# Patient Record
Sex: Female | Born: 1985
Health system: Southern US, Community
[De-identification: ages and names within clinical notes are randomized; demographics above are authoritative.]

## PROBLEM LIST (undated history)

## (undated) DIAGNOSIS — E119 Type 2 diabetes mellitus without complications: Secondary | ICD-10-CM

## (undated) DIAGNOSIS — L309 Dermatitis, unspecified: Secondary | ICD-10-CM

## (undated) DIAGNOSIS — H548 Legal blindness, as defined in USA: Secondary | ICD-10-CM

## (undated) DIAGNOSIS — H409 Unspecified glaucoma: Secondary | ICD-10-CM

## (undated) DIAGNOSIS — E282 Polycystic ovarian syndrome: Secondary | ICD-10-CM

## (undated) HISTORY — DX: Unspecified glaucoma: H40.9

## (undated) HISTORY — PX: EYE SURGERY: SHX253

## (undated) HISTORY — DX: Polycystic ovarian syndrome: E28.2

## (undated) HISTORY — DX: Legal blindness, as defined in USA: H54.8

## (undated) HISTORY — DX: Dermatitis, unspecified: L30.9

---
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
## 2008-03-10 ENCOUNTER — Emergency Department (HOSPITAL_COMMUNITY): Admission: EM | Admit: 2008-03-10 | Discharge: 2008-03-10 | Payer: Self-pay | Admitting: Emergency Medicine

## 2008-04-07 ENCOUNTER — Emergency Department (HOSPITAL_COMMUNITY): Admission: EM | Admit: 2008-04-07 | Discharge: 2008-04-07 | Payer: Self-pay | Admitting: Emergency Medicine

## 2008-06-08 ENCOUNTER — Emergency Department (HOSPITAL_COMMUNITY): Admission: EM | Admit: 2008-06-08 | Discharge: 2008-06-08 | Payer: Self-pay | Admitting: Emergency Medicine

## 2008-09-16 ENCOUNTER — Emergency Department (HOSPITAL_COMMUNITY): Admission: EM | Admit: 2008-09-16 | Discharge: 2008-09-16 | Payer: Self-pay | Admitting: Emergency Medicine

## 2009-06-28 ENCOUNTER — Emergency Department (HOSPITAL_COMMUNITY): Admission: EM | Admit: 2009-06-28 | Discharge: 2009-06-28 | Payer: Self-pay | Admitting: Emergency Medicine

## 2009-06-30 ENCOUNTER — Emergency Department (HOSPITAL_COMMUNITY): Admission: EM | Admit: 2009-06-30 | Discharge: 2009-06-30 | Payer: Self-pay | Admitting: Emergency Medicine

## 2009-10-31 ENCOUNTER — Emergency Department (HOSPITAL_COMMUNITY): Admission: EM | Admit: 2009-10-31 | Discharge: 2009-10-31 | Payer: Self-pay | Admitting: Emergency Medicine

## 2010-06-02 LAB — BASIC METABOLIC PANEL
Calcium: 9.1 mg/dL (ref 8.4–10.5)
Chloride: 108 mEq/L (ref 96–112)
GFR calc Af Amer: >60 mL/min (ref >60)
Potassium: 3.7 mEq/L (ref 3.5–5.1)

## 2010-06-02 LAB — POCT CARDIAC MARKERS
CKMB, poc: 1.2 ng/mL (ref 1.0–8.0)
Myoglobin, poc: 105 ng/mL (ref 12–200)

## 2010-06-07 LAB — BASIC METABOLIC PANEL
GFR calc Af Amer: >60 mL/min (ref >60)
GFR calc non Af Amer: >60 mL/min (ref >60)
Potassium: 3.8 mEq/L (ref 3.5–5.1)
Sodium: 137 mEq/L (ref 135–145)

## 2010-06-07 LAB — URINE MICROSCOPIC-ADD ON

## 2010-06-07 LAB — URINALYSIS, ROUTINE W REFLEX MICROSCOPIC
Bilirubin Urine: NEGATIVE
Bilirubin Urine: NEGATIVE
Glucose, UA: NEGATIVE mg/dL
Hgb urine dipstick: NEGATIVE
Ketones, ur: NEGATIVE mg/dL
Nitrite: NEGATIVE
Nitrite: NEGATIVE
Protein, ur: NEGATIVE mg/dL
Specific Gravity, Urine: 1.02 (ref 1.005–1.030)
Specific Gravity, Urine: 1.023 (ref 1.005–1.030)
pH: 5.5 (ref 5.0–8.0)

## 2010-06-07 LAB — CBC
HCT: 38.9 % (ref 36.0–46.0)
MCHC: 34.1 g/dL (ref 30.0–36.0)
MCV: 86.7 fL (ref 78.0–100.0)
RBC: 4.49 MIL/uL (ref 3.87–5.11)

## 2010-06-07 LAB — GLUCOSE, CAPILLARY: Glucose-Capillary: 85 mg/dL (ref 70–99)

## 2010-06-07 LAB — DIFFERENTIAL
Lymphs Abs: 3.1 10*3/uL (ref 0.7–4.0)
Monocytes Absolute: 0.3 10*3/uL (ref 0.1–1.0)
Monocytes Relative: 5 % (ref 3–12)
Neutro Abs: 2.2 10*3/uL (ref 1.7–7.7)
Neutrophils Relative %: 37 % — ABNORMAL LOW (ref 43–77)

## 2010-06-07 LAB — POCT PREGNANCY, URINE: Preg Test, Ur: NEGATIVE

## 2010-06-25 LAB — RAPID STREP SCREEN (MED CTR MEBANE ONLY): Streptococcus, Group A Screen (Direct): NEGATIVE

## 2010-06-29 LAB — URINALYSIS, ROUTINE W REFLEX MICROSCOPIC
Bilirubin Urine: NEGATIVE
Glucose, UA: NEGATIVE mg/dL
Hgb urine dipstick: NEGATIVE
Ketones, ur: NEGATIVE mg/dL
Nitrite: NEGATIVE
pH: 6 (ref 5.0–8.0)

## 2010-06-29 LAB — POCT PREGNANCY, URINE: Preg Test, Ur: NEGATIVE

## 2010-06-29 LAB — WET PREP, GENITAL: Yeast Wet Prep HPF POC: NONE SEEN

## 2010-07-01 ENCOUNTER — Emergency Department (HOSPITAL_COMMUNITY)
Admission: EM | Admit: 2010-07-01 | Discharge: 2010-07-01 | Disposition: A | Discharge status: Home / Self Care | Payer: PRIVATE HEALTH INSURANCE | Attending: Emergency Medicine | Admitting: Emergency Medicine

## 2010-07-01 DIAGNOSIS — J029 Acute pharyngitis, unspecified: Secondary | ICD-10-CM | POA: Insufficient documentation

## 2010-07-01 DIAGNOSIS — R05 Cough: Secondary | ICD-10-CM | POA: Insufficient documentation

## 2010-07-01 DIAGNOSIS — H9209 Otalgia, unspecified ear: Secondary | ICD-10-CM | POA: Insufficient documentation

## 2010-07-01 DIAGNOSIS — R059 Cough, unspecified: Secondary | ICD-10-CM | POA: Insufficient documentation

## 2010-07-01 DIAGNOSIS — J069 Acute upper respiratory infection, unspecified: Secondary | ICD-10-CM | POA: Insufficient documentation

## 2010-07-01 LAB — RAPID STREP SCREEN (MED CTR MEBANE ONLY): Streptococcus, Group A Screen (Direct): NEGATIVE

## 2010-07-03 LAB — URINE MICROSCOPIC-ADD ON

## 2010-07-03 LAB — CBC
HCT: 37.7 % (ref 36.0–46.0)
Hemoglobin: 12.7 g/dL (ref 12.0–15.0)
MCHC: 33.5 g/dL (ref 30.0–36.0)
MCV: 87.2 fL (ref 78.0–100.0)
Platelets: 265 10*3/uL (ref 150–400)
RBC: 4.33 MIL/uL (ref 3.87–5.11)
RDW: 12.7 % (ref 11.5–15.5)
WBC: 5.7 10*3/uL (ref 4.0–10.5)

## 2010-07-03 LAB — URINALYSIS, ROUTINE W REFLEX MICROSCOPIC
Bilirubin Urine: NEGATIVE
Glucose, UA: NEGATIVE mg/dL
Ketones, ur: NEGATIVE mg/dL
Leukocytes, UA: NEGATIVE
Nitrite: NEGATIVE
Protein, ur: NEGATIVE mg/dL
Specific Gravity, Urine: 1.026 (ref 1.005–1.030)
Urobilinogen, UA: 0.2 mg/dL (ref 0.0–1.0)
pH: 6 (ref 5.0–8.0)

## 2010-07-03 LAB — DIFFERENTIAL
Basophils Absolute: 0 10*3/uL (ref 0.0–0.1)
Basophils Relative: 0 % (ref 0–1)
Eosinophils Absolute: 0.2 10*3/uL (ref 0.0–0.7)
Eosinophils Relative: 4 % (ref 0–5)
Lymphocytes Relative: 45 % (ref 12–46)
Lymphs Abs: 2.6 10*3/uL (ref 0.7–4.0)
Monocytes Absolute: 0.3 10*3/uL (ref 0.1–1.0)
Monocytes Relative: 5 % (ref 3–12)
Neutro Abs: 2.6 10*3/uL (ref 1.7–7.7)
Neutrophils Relative %: 46 % (ref 43–77)

## 2010-07-03 LAB — GC/CHLAMYDIA PROBE AMP, GENITAL: GC Probe Amp, Genital: NEGATIVE

## 2010-07-03 LAB — POCT PREGNANCY, URINE: Preg Test, Ur: NEGATIVE

## 2010-07-03 LAB — WET PREP, GENITAL
Clue Cells Wet Prep HPF POC: NONE SEEN
Trich, Wet Prep: NONE SEEN

## 2010-08-04 NOTE — Op Note (Signed)
NAME:  Maria Maddox, Maria Maddox                           ACCOUNT NO.:  [id number]   MEDICAL RECORD NO.:  [id number]                   PATIENT TYPE:  OIB   LOCATION:  2871                                 FACILITY:  [facility]   PHYSICIAN:  Tyrone Apple. Karleen Hampshire, M.D.            DATE OF BIRTH:  Oct 25, 1985   DATE OF PROCEDURE:  10/13/2003  DATE OF DISCHARGE:  10/13/2003                                 OPERATIVE REPORT   PREOPERATIVE DIAGNOSIS:  1. Traumatic aphakia, OD.  2. Amblyopia, OD.  3. Secondary exotropia.   POSTOPERATIVE DIAGNOSIS:  Status post successful intraocular lens implant,  status post lysis and removal of anterior chamber vitreous debris.   PROCEDURE:  1. Insertion of anterior chamber intraocular lens.  2. Lysis and removal of vitreous strands and debris from anterior chamber.   SURGEON:  Tyrone Apple. Karleen Hampshire, M.D.   ANESTHESIA:  General with endotracheal intubation.   INDICATIONS FOR PROCEDURE:  Maria Maddox is a 25 year old female with a  history of trauma to the right eye in childhood resulting in a traumatic  cataract which resulted in aphakia which resulted in amblyopia and secondary  exotropia.  This procedure is indicated to restore visual correction with  the implantation of a intraocular lens.  The risks and benefits of the  procedure were explained to the patient and the patient's parents prior to  the procedure, informed consent was obtained.   DESCRIPTION OF PROCEDURE:  After induction of general anesthesia and  establishment of adequate endotracheal airway, the patient was prepped and  draped in the usual sterile manner.  Our attention was turned first to the  right eye.  Using the operating microscope, the eye was examined and noted  to have a previously placed peripheral iridectomy between the 1 and 2  o'clock positions with residual posterior capsular remnant being seen on the  pupillary margins superior aspect extending from approximately the 10  o'clock position  to the 2 o'clock position.  The cornea was clear and the  conjunctivae was noted to have multiple cicatricial areas from previous  operations.  The 4-0 silk traction suture was placed at the superior rectus  muscle and used to position the globe where it had depressed in the ducted  position.  A limited conjunctival peritomy was then fashioned between the 11  o'clock and the 1:30 meridians by blunt and sharp dissection.  Dissection  was carried down to bare sclera through the cicatricial tissue.  Hemostasis  was achieved with wet field cautery.  The mark was then placed on the sclera  2 mm posterior to the limbus and 6 mm in length.  Using an angled 66 beaver  blade, a scleral tunnel incision, which was bidirectional was then  constructed and dissection of the partial thickness lamellar scleral  dissection was then carried forward to the clear cornea.  Once the anterior  extent of the incision was reached and the  clear cornea, the incision was  then extended bilaterally for approximately 6 mm to the same depth.  The  anterior chamber was then entered using a 3.2 mm keratome and OcuCoat was  then irrigated into the anterior chamber.  The incision was then extended  for the full 6 mm length and a 13.5 diopter 12.l5 mm total length anterior  chamber intraocular lens was then inserted via the incision.  The white to  white limbal measurements in the horizontal plane were taken prior to  choosing the lens and was found to be exactly 12.5 mm.  Once the lens was  inserted, it was located so its long axis would lie along the 180 degree  meridian.  Several 9-0 Vicryl sutures were then placed in the incision site  securing the scleral tunnel and irrigation aspiration hand piece device was  then inserted through the remaining aperture of the incision and the  residual anterior debris including coagulated heme from the incision site  was removed from the anterior chamber.  Once this was removed, the  incision  was closed with additional interrupted 9-0 Vicryl sutures.  The wound was  checked for security with a Weck-Cel and found to be firm and intact.  The  conjunctiva was then closed using two interrupted 9-0 Vicryl sutures.  Antibiotic ointment approximately 0.5 grams of Ancef was given  subconjuctivally inferonasal and 2 mg of Decadron were also given in the  same quadrant.  At the conclusion of the procedure, antibiotic ointment,  TobraDex, was instilled in the inferior fornices of the right eye and a  double pressure patch and Fox shield was applied.  There were no  complications.                                               Casimiro Needle A. Karleen Hampshire, M.D.    MAS/MEDQ  D:  10/13/2003  T:  10/13/2003  Job:  [id number]

## 2010-09-04 ENCOUNTER — Emergency Department (HOSPITAL_COMMUNITY)
Admission: EM | Admit: 2010-09-04 | Discharge: 2010-09-04 | Disposition: A | Discharge status: Home / Self Care | Payer: PRIVATE HEALTH INSURANCE | Attending: Emergency Medicine | Admitting: Emergency Medicine

## 2010-09-04 DIAGNOSIS — J329 Chronic sinusitis, unspecified: Secondary | ICD-10-CM | POA: Insufficient documentation

## 2010-09-04 DIAGNOSIS — R51 Headache: Secondary | ICD-10-CM | POA: Insufficient documentation

## 2010-09-04 DIAGNOSIS — H9209 Otalgia, unspecified ear: Secondary | ICD-10-CM | POA: Insufficient documentation

## 2010-09-04 DIAGNOSIS — H6690 Otitis media, unspecified, unspecified ear: Secondary | ICD-10-CM | POA: Insufficient documentation

## 2010-12-22 LAB — POCT URINALYSIS DIP (DEVICE)
Bilirubin Urine: NEGATIVE
Glucose, UA: NEGATIVE mg/dL
Hgb urine dipstick: NEGATIVE
Nitrite: NEGATIVE
Urobilinogen, UA: 0.2 mg/dL (ref 0.0–1.0)

## 2010-12-22 LAB — POCT PREGNANCY, URINE: Preg Test, Ur: NEGATIVE

## 2010-12-22 LAB — WET PREP, GENITAL
Clue Cells Wet Prep HPF POC: NONE SEEN
Trich, Wet Prep: NONE SEEN
Yeast Wet Prep HPF POC: NONE SEEN

## 2010-12-22 LAB — GC/CHLAMYDIA PROBE AMP, GENITAL: Chlamydia, DNA Probe: NEGATIVE

## 2011-02-21 ENCOUNTER — Emergency Department (HOSPITAL_COMMUNITY)
Admission: EM | Admit: 2011-02-21 | Discharge: 2011-02-21 | Disposition: A | Discharge status: Home / Self Care | Payer: BC Managed Care – PPO | Attending: Emergency Medicine | Admitting: Emergency Medicine

## 2011-02-21 ENCOUNTER — Encounter: Payer: Self-pay | Admitting: Emergency Medicine

## 2011-02-21 DIAGNOSIS — J329 Chronic sinusitis, unspecified: Secondary | ICD-10-CM | POA: Insufficient documentation

## 2011-02-21 DIAGNOSIS — R51 Headache: Secondary | ICD-10-CM | POA: Insufficient documentation

## 2011-02-21 MED ORDER — FLUTICASONE PROPIONATE 50 MCG/ACT NA SUSP: 2.0000 | Freq: Every day | NASAL | Status: DC

## 2011-02-21 MED ORDER — AMOXICILLIN 500 MG PO CAPS: 500.0000 mg | ORAL_CAPSULE | Freq: Three times a day (TID) | ORAL | Status: AC

## 2011-02-21 MED ORDER — IBUPROFEN 800 MG PO TABS
800.0000 mg | ORAL_TABLET | Freq: Once | ORAL | Status: AC
  Administered 2011-02-21: 800 mg via ORAL
  Filled 2011-02-21: qty 1

## 2011-02-21 MED ORDER — AMOXICILLIN 500 MG PO CAPS
500.0000 mg | ORAL_CAPSULE | Freq: Once | ORAL | Status: AC
  Administered 2011-02-21: 500 mg via ORAL
  Filled 2011-02-21: qty 1

## 2011-02-21 NOTE — ED Provider Notes (Signed)
History     CSN: [id number] Arrival date & time: 02/21/2011  5:42 PM   First MD Initiated Contact with Patient 02/21/11 1948      Chief Complaint  Patient presents with  . Headache    (Consider location/radiation/quality/duration/timing/severity/associated sxs/prior treatment) HPI  Patient presents to ER complaining of a 2 week hx of waxing and waning frontal sinus pressure and headache with pain increasing over the last few days and radiating into left ear and toward left jaw. Patient states she has been taking OTC decongestants without relief of symptoms. Denies known fevers, visual changes, dizziness, stiff neck, sorethroat or cough. Patient takes no meds on regular basis and has no known medical problems. Symptoms were gradual onset, persistent and worsening. Patient states, "bending over kills my head, like my whole head is in a tunnel or full feeling." denies alleviating factors.   History reviewed. No pertinent past medical history.  Past Surgical History  Procedure Date  . Eye surgery     right side, numerous surgeries (now blind)    No family history on file.  History  Substance Use Topics  . Smoking status: Never Smoker   . Smokeless tobacco: Not on file  . Alcohol Use: No    OB History    Grav Para Term Preterm Abortions TAB SAB Ect Mult Living                  Review of Systems  All other systems reviewed and are negative.    Allergies  Review of patient's allergies indicates no known allergies.  Home Medications   Current Outpatient Rx  Name Route Sig Dispense Refill  . AMOXICILLIN 500 MG PO CAPS Oral Take 1 capsule (500 mg total) by mouth 3 (three) times daily. 21 capsule 0  . FLUTICASONE PROPIONATE 50 MCG/ACT NA SUSP Nasal Place 2 sprays into the nose daily. 16 g 2    BP 130/72  Pulse 83  Temp(Src) 98.2 F (36.8 C) (Oral)  Resp 16  Ht 5\' 6"  (1.676 m)  Wt 250 lb (113.399 kg)  BMI 40.35 kg/m2  SpO2 100%  LMP 01/21/2011  Physical Exam    Vitals reviewed. Constitutional: She is oriented to person, place, and time. She appears well-developed and well-nourished. No distress.  HENT:  Head: Normocephalic and atraumatic.  Right Ear: External ear normal.  Left Ear: External ear normal.  Nose: Left sinus exhibits maxillary sinus tenderness and frontal sinus tenderness.  Mouth/Throat: No oropharyngeal exudate.       Mild erythema of posterior pharynx and tonsils no tonsillar exudate or enlargement. Patent airway. Swallowing secretions well  Eyes: Conjunctivae and EOM are normal. Pupils are equal, round, and reactive to light.  Neck: Normal range of motion. Neck supple. No Brudzinski's sign and no Kernig's sign noted.  Cardiovascular: Normal rate, regular rhythm and normal heart sounds.  Exam reveals no gallop and no friction rub.   No murmur heard. Pulmonary/Chest: Effort normal and breath sounds normal. No respiratory distress. She has no wheezes. She has no rales. She exhibits no tenderness.  Abdominal: Soft. She exhibits no distension and no mass. There is no tenderness. There is no rebound and no guarding.  Lymphadenopathy:    She has no cervical adenopathy.  Neurological: She is alert and oriented to person, place, and time. She has normal reflexes.  Skin: Skin is warm and dry. No rash noted. She is not diaphoretic.  Psychiatric: She has a normal mood and affect.    ED  Course  Procedures (including critical care time)  Labs Reviewed - No data to display No results found.   1. Sinusitis       MDM  Afebrile, non toxic appearing, no meningeal signs. Sinus TTP of unilateral left side. Normal left TM.         Jenness Corner, [place] 02/21/11 2003

## 2011-02-21 NOTE — ED Notes (Signed)
Pt reports headache that began two weeks ago and has become much worse, now hurts in nose and left ear; pt denies cough, cold symptoms; Pt reports she has been taking various OTC sinus pressure medications and ibuprofen. Does not have a PCP.

## 2011-02-21 NOTE — ED Notes (Signed)
Patient given discharge instructions, information, prescriptions, and diet order. Patient states that they adequately understand discharge information given and to return to ED if symptoms return or worsen.    Patient instructed to go to urgent care for follow up and given 2 Rx

## 2011-02-22 NOTE — ED Provider Notes (Signed)
Medical screening examination/treatment/procedure(s) were performed by non-physician practitioner and as supervising physician I was immediately available for consultation/collaboration.   Jalei Shibley A Tavionna Grout, MD 02/22/11 1316 

## 2011-09-03 ENCOUNTER — Emergency Department (HOSPITAL_COMMUNITY)
Admission: EM | Admit: 2011-09-03 | Discharge: 2011-09-03 | Disposition: A | Discharge status: Home / Self Care | Payer: 59 | Source: Home / Self Care | Attending: Emergency Medicine | Admitting: Emergency Medicine

## 2011-09-03 ENCOUNTER — Encounter (HOSPITAL_COMMUNITY): Payer: Self-pay

## 2011-09-03 DIAGNOSIS — J209 Acute bronchitis, unspecified: Secondary | ICD-10-CM

## 2011-09-03 MED ORDER — HYDROCOD POLST-CHLORPHEN POLST 10-8 MG/5ML PO LQCR: 5.0000 mL | Freq: Two times a day (BID) | ORAL | Status: DC | PRN

## 2011-09-03 MED ORDER — ALBUTEROL SULFATE HFA 108 (90 BASE) MCG/ACT IN AERS: 1.0000 | INHALATION_SPRAY | Freq: Four times a day (QID) | RESPIRATORY_TRACT | Status: DC | PRN

## 2011-09-03 MED ORDER — PREDNISONE 5 MG PO KIT: 1.0000 | PACK | Freq: Every day | ORAL | Status: DC

## 2011-09-03 MED ORDER — AZITHROMYCIN 250 MG PO TABS: ORAL_TABLET | ORAL | Status: AC

## 2011-09-03 NOTE — Discharge Instructions (Signed)

## 2011-09-03 NOTE — ED Provider Notes (Signed)
Chief Complaint  Patient presents with  . 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 This all eventually went away but she was left with a cough productive of yellow-brown sputum, wheezing, and nasal congestion and yellowish brown drainage.  Review of Systems:  Other than noted above, the patient denies any of the following symptoms. Systemic:  No fever, chills, sweats, fatigue, myalgias, headache, or anorexia. Eye:  No redness, pain or drainage. ENT:  No earache, ear congestion, nasal congestion, sneezing, rhinorrhea, sinus pressure, sinus pain, post nasal drip, or sore throat. Lungs:  No cough, sputum production, wheezing, shortness of breath, or chest pain. GI:  No abdominal pain, nausea, vomiting, or diarrhea. Skin:  No rash or itching.  PMFSH:  Past medical history, family history, social history, meds, and allergies were reviewed.  Physical Exam:   Vital signs:  BP 104/71  Pulse 91  Temp 98.4 F (36.9 C) (Oral)  Resp 20  SpO2 97%  LMP 08/27/2011 General:  Alert, in no distress. Eye:  No conjunctival injection or drainage. Lids were normal. ENT:  TMs and canals were normal, without erythema or inflammation.  Nasal mucosa was clear and uncongested, without drainage.  Mucous membranes were moist.  Pharynx was clear, without exudate or drainage.  There were no oral ulcerations or lesions. Neck:  Supple, no adenopathy, tenderness or mass. Lungs:  No respiratory distress.  Lungs were clear to auscultation, without wheezes, rales or rhonchi.  Breath sounds were clear and equal bilaterally. Lungs were resonant to percussion.  No egophony. Heart:  Regular rhythm, without gallops, murmers or rubs. Skin:  Clear, warm, and dry, without rash or lesions.  Labs:   Results for orders placed during the hospital encounter of 07/01/10  RAPID STREP  SCREEN      Component Value Range   Streptococcus, Group A Screen (Direct) NEGATIVE  NEGATIVE    Assessment:  The encounter diagnosis was Acute bronchitis.  Plan:   1.  The following meds were prescribed:   New Prescriptions   ALBUTEROL (PROVENTIL HFA;VENTOLIN HFA) 108 (90 BASE) MCG/ACT INHALER    Inhale 1-2 puffs into the lungs every 6 (six) hours as needed for wheezing.   AZITHROMYCIN (ZITHROMAX Z-PAK) 250 MG TABLET    Take as directed.   CHLORPHENIRAMINE-HYDROCODONE (TUSSIONEX) 10-8 MG/5ML LQCR    Take 5 mLs by mouth every 12 (twelve) hours as needed.   PREDNISONE 5 MG KIT    Take 1 kit (5 mg total) by mouth daily after breakfast. Prednisone 5 mg 6 day dosepack.  Take as directed.   2.  The patient was instructed in symptomatic care and handouts were given. 3.  The patient was told to return if becoming worse in any way, if no better in 3 or 4 days, and given some red flag symptoms that would indicate earlier return.   Reuben Likes, MD 09/03/11 [phone]

## 2011-09-03 NOTE — ED Notes (Signed)
C/o 2 week duration of cough, no relief w OTC medications; NAD; asking for medication to help her sleep at night

## 2011-09-17 ENCOUNTER — Encounter (HOSPITAL_COMMUNITY): Payer: Self-pay | Admitting: Emergency Medicine

## 2011-09-17 ENCOUNTER — Emergency Department (INDEPENDENT_AMBULATORY_CARE_PROVIDER_SITE_OTHER): Payer: 59

## 2011-09-17 ENCOUNTER — Emergency Department (HOSPITAL_COMMUNITY)
Admission: EM | Admit: 2011-09-17 | Discharge: 2011-09-17 | Disposition: A | Discharge status: Home / Self Care | Payer: 59 | Source: Home / Self Care | Attending: Family Medicine | Admitting: Family Medicine

## 2011-09-17 DIAGNOSIS — J8409 Other alveolar and parieto-alveolar conditions: Secondary | ICD-10-CM

## 2011-09-17 DIAGNOSIS — J8489 Other specified interstitial pulmonary diseases: Secondary | ICD-10-CM

## 2011-09-17 MED ORDER — IPRATROPIUM BROMIDE 0.02 % IN SOLN
0.5000 mg | Freq: Once | RESPIRATORY_TRACT | Status: AC
  Administered 2011-09-17: 0.5 mg via RESPIRATORY_TRACT

## 2011-09-17 MED ORDER — ALBUTEROL SULFATE (5 MG/ML) 0.5% IN NEBU
5.0000 mg | INHALATION_SOLUTION | Freq: Once | RESPIRATORY_TRACT | Status: AC
  Administered 2011-09-17: 5 mg via RESPIRATORY_TRACT

## 2011-09-17 MED ORDER — MONTELUKAST SODIUM 10 MG PO TABS: 10.0000 mg | ORAL_TABLET | Freq: Every day | ORAL | Status: DC

## 2011-09-17 MED ORDER — FLUTICASONE-SALMETEROL 100-50 MCG/DOSE IN AEPB: 1.0000 | INHALATION_SPRAY | Freq: Two times a day (BID) | RESPIRATORY_TRACT | Status: DC

## 2011-09-17 MED ORDER — ALBUTEROL SULFATE (5 MG/ML) 0.5% IN NEBU
INHALATION_SOLUTION | RESPIRATORY_TRACT | Status: AC
  Filled 2011-09-17: qty 1

## 2011-09-17 MED ORDER — DOXYCYCLINE HYCLATE 100 MG PO CAPS: 100.0000 mg | ORAL_CAPSULE | Freq: Two times a day (BID) | ORAL | Status: AC

## 2011-09-17 MED ORDER — METHYLPREDNISOLONE ACETATE 80 MG/ML IJ SUSP
INTRAMUSCULAR | Status: AC
  Filled 2011-09-17: qty 1

## 2011-09-17 MED ORDER — METHYLPREDNISOLONE ACETATE 40 MG/ML IJ SUSP
80.0000 mg | Freq: Once | INTRAMUSCULAR | Status: AC
  Administered 2011-09-17: 80 mg via INTRAMUSCULAR

## 2011-09-17 NOTE — ED Notes (Signed)
RETURNS TODAY WITH WORSENING SX COUGH CONGESTION AND TIGHTNESS,CHILLS AND SINUS H/A S/P BRONCHITIS LAST WEEK.STATES SHE WAS PRESCRIBED ATB,INHALER AND STEROIDS WHICH ONLY WORKED FOR THAT WEEK BUT SX PROGRESSED.DIFF SLEEPING AND SOB.

## 2011-09-17 NOTE — Discharge Instructions (Signed)
Take all of medicine, drink lots of fluids, see your doctor if further problems °

## 2011-09-17 NOTE — ED Provider Notes (Signed)
History     CSN: [id number]  Arrival date & time 09/17/11  1543   First MD Initiated Contact with Patient 09/17/11 1643      Chief Complaint  Patient presents with  . Sinusitis  . Cough    (Consider location/radiation/quality/duration/timing/severity/associated sxs/prior treatment) Patient is a 26 y.o. female presenting with sinusitis and cough. The history is provided by the patient.  Sinusitis  This is a recurrent problem. The current episode started more than 1 week ago. The problem has been gradually worsening (seen 6/17 , not improved on meds, has involved head and chest.). There has been no fever. The patient is experiencing no pain. Associated symptoms include chills, congestion, ear pain, sinus pressure, cough and shortness of breath. Pertinent negatives include no sore throat.  Cough Associated symptoms include chills, ear pain, rhinorrhea, shortness of breath and wheezing. Pertinent negatives include no sore throat.    History reviewed. No pertinent past medical history.  Past Surgical History  Procedure Date  . Eye surgery     right side, numerous surgeries (now blind)    No family history on file.  History  Substance Use Topics  . Smoking status: Never Smoker   . Smokeless tobacco: Not on file  . Alcohol Use: No    OB History    Grav Para Term Preterm Abortions TAB SAB Ect Mult Living                  Review of Systems  Constitutional: Positive for chills.  HENT: Positive for ear pain, congestion, rhinorrhea, postnasal drip and sinus pressure. Negative for sore throat.   Respiratory: Positive for cough, shortness of breath and wheezing.   Gastrointestinal: Negative.   Skin: Negative.     Allergies  Review of patient's allergies indicates no known allergies.  Home Medications   Current Outpatient Rx  Name Route Sig Dispense Refill  . ALBUTEROL SULFATE HFA 108 (90 BASE) MCG/ACT IN AERS Inhalation Inhale 1-2 puffs into the lungs every 6 (six) hours  as needed for wheezing. 1 Inhaler 0  . HYDROCOD POLST-CPM POLST ER 10-8 MG/5ML PO LQCR Oral Take 5 mLs by mouth every 12 (twelve) hours as needed. 140 mL 0  . DOXYCYCLINE HYCLATE 100 MG PO CAPS Oral Take 1 capsule (100 mg total) by mouth 2 (two) times daily. 20 capsule 0  . FLUTICASONE PROPIONATE 50 MCG/ACT NA SUSP Nasal Place 2 sprays into the nose daily. 16 g 2  . FLUTICASONE-SALMETEROL 100-50 MCG/DOSE IN AEPB Inhalation Inhale 1 puff into the lungs 2 (two) times daily. Please instruct in usage 60 each 0  . MONTELUKAST SODIUM 10 MG PO TABS Oral Take 1 tablet (10 mg total) by mouth at bedtime. 30 tablet 1  . PREDNISONE 5 MG PO KIT Oral Take 1 kit (5 mg total) by mouth daily after breakfast. Prednisone 5 mg 6 day dosepack.  Take as directed. 1 kit 0    BP 143/80  Pulse 90  Temp 98.2 F (36.8 C) (Oral)  Resp 18  SpO2 96%  LMP 08/27/2011  Physical Exam  Nursing note and vitals reviewed. Constitutional: She is oriented to person, place, and time. She appears well-developed and well-nourished.  HENT:  Head: Normocephalic.  Right Ear: External ear normal. Tympanic membrane is erythematous and retracted.  Left Ear: External ear normal. Tympanic membrane is erythematous and retracted.  Nose: Mucosal edema and rhinorrhea present.  Mouth/Throat: Oropharynx is clear and moist.  Cardiovascular: Normal rate, regular rhythm, normal heart sounds  and intact distal pulses.   Pulmonary/Chest: Effort normal and breath sounds normal.  Neurological: She is alert and oriented to person, place, and time.  Skin: Skin is warm and dry.    ED Course  Procedures (including critical care time)  Labs Reviewed - No data to display Dg Chest 2 View  09/17/2011  *RADIOLOGY REPORT*  Clinical Data: Cough and wheezing.  CHEST - 2 VIEW  Comparison: None  Findings: The cardiac silhouette, mediastinal and hilar contours are within normal limits.  There is peribronchial thickening and increased interstitial markings  suggesting bronchitis or interstitial pneumonitis.  No focal airspace consolidation to suggest pneumonia.  No pleural effusions.  The bony thorax is intact.  IMPRESSION: Findings suggest bronchitis or interstitial pneumonitis.  Original Report Authenticated By: P. Loralie Champagne, M.D.     1. Nonspecific interstitial pneumonitis       MDM  X-rays reviewed and report per radiologist.    Barkley Bruns MD.          Linna Hoff, MD 09/17/11 2030

## 2012-09-07 ENCOUNTER — Encounter (HOSPITAL_COMMUNITY): Payer: Self-pay | Admitting: Emergency Medicine

## 2012-09-07 ENCOUNTER — Emergency Department (HOSPITAL_COMMUNITY)
Admission: EM | Admit: 2012-09-07 | Discharge: 2012-09-07 | Disposition: A | Discharge status: Home / Self Care | Payer: 59 | Attending: Emergency Medicine | Admitting: Emergency Medicine

## 2012-09-07 DIAGNOSIS — Z79899 Other long term (current) drug therapy: Secondary | ICD-10-CM | POA: Insufficient documentation

## 2012-09-07 DIAGNOSIS — E669 Obesity, unspecified: Secondary | ICD-10-CM | POA: Insufficient documentation

## 2012-09-07 DIAGNOSIS — IMO0002 Reserved for concepts with insufficient information to code with codable children: Secondary | ICD-10-CM | POA: Insufficient documentation

## 2012-09-07 DIAGNOSIS — L0501 Pilonidal cyst with abscess: Secondary | ICD-10-CM | POA: Insufficient documentation

## 2012-09-07 DIAGNOSIS — K5900 Constipation, unspecified: Secondary | ICD-10-CM | POA: Insufficient documentation

## 2012-09-07 MED ORDER — FLUCONAZOLE 150 MG PO TABS
150.0000 mg | ORAL_TABLET | Freq: Once | ORAL | Status: AC
  Administered 2012-09-07: 150 mg via ORAL
  Filled 2012-09-07: qty 1

## 2012-09-07 MED ORDER — OXYCODONE-ACETAMINOPHEN 5-325 MG PO TABS
2.0000 | ORAL_TABLET | Freq: Once | ORAL | Status: AC
  Administered 2012-09-07: 2 via ORAL
  Filled 2012-09-07 (×2): qty 2

## 2012-09-07 MED ORDER — CLINDAMYCIN HCL 300 MG PO CAPS: 300.0000 mg | ORAL_CAPSULE | Freq: Four times a day (QID) | ORAL | Status: DC

## 2012-09-07 MED ORDER — OXYCODONE-ACETAMINOPHEN 5-325 MG PO TABS: ORAL_TABLET | ORAL | Status: DC

## 2012-09-07 MED ORDER — CLINDAMYCIN HCL 300 MG PO CAPS
300.0000 mg | ORAL_CAPSULE | Freq: Once | ORAL | Status: AC
  Administered 2012-09-07: 300 mg via ORAL
  Filled 2012-09-07: qty 1

## 2012-09-07 NOTE — ED Notes (Signed)
Pt c/o abscess on tailbone x2 days.   Pt also c/o yeast infection due to epsom salt being placed on boil.  Pt c/o 10/10

## 2012-09-07 NOTE — ED Notes (Signed)
MD at bedside. 

## 2012-09-07 NOTE — ED Notes (Signed)
Suture cart set bedside

## 2012-09-07 NOTE — ED Provider Notes (Signed)
History     CSN: [id number]  Arrival date & time 09/07/12  1347   First MD Initiated Contact with Patient 09/07/12 1352      Chief Complaint  Patient presents with  . Abscess    (Consider location/radiation/quality/duration/timing/severity/associated sxs/prior treatment) The history is provided by the patient.  Maria Maddox is a 27 y.o. female here with possible abscess. She noticed that she was constipated the last few days and had a boil in her buttock. She's been trying Epson salt baths but it didn't help. Over the last 2 days it got worse and he had subjective fevers yesterday. No purulent drainage from that site. He did history of buttock abscesses before but she said always drains on its own. She said that she is not constipated now but the area still hurts. Denies history of MRSA or diabetes.     History reviewed. No pertinent past medical history.  Past Surgical History  Procedure Laterality Date  . Eye surgery      right side, numerous surgeries (now blind)    No family history on file.  History  Substance Use Topics  . Smoking status: Never Smoker   . Smokeless tobacco: Not on file  . Alcohol Use: No    OB History   Grav Para Term Preterm Abortions TAB SAB Ect Mult Living                  Review of Systems  Gastrointestinal:       Buttock abscess   All other systems reviewed and are negative.    Allergies  Review of patient's allergies indicates no known allergies.  Home Medications   Current Outpatient Rx  Name  Route  Sig  Dispense  Refill  . EXPIRED: albuterol (PROVENTIL HFA;VENTOLIN HFA) 108 (90 BASE) MCG/ACT inhaler   Inhalation   Inhale 1-2 puffs into the lungs every 6 (six) hours as needed for wheezing.   1 Inhaler   0   . chlorpheniramine-HYDROcodone (TUSSIONEX) 10-8 MG/5ML LQCR   Oral   Take 5 mLs by mouth every 12 (twelve) hours as needed.   140 mL   0   . EXPIRED: fluticasone (FLONASE) 50 MCG/ACT nasal spray   Nasal   Place 2  sprays into the nose daily.   16 g   2   . Fluticasone-Salmeterol (ADVAIR DISKUS) 100-50 MCG/DOSE AEPB   Inhalation   Inhale 1 puff into the lungs 2 (two) times daily. Please instruct in usage   60 each   0   . montelukast (SINGULAIR) 10 MG tablet   Oral   Take 1 tablet (10 mg total) by mouth at bedtime.   30 tablet   1   . PredniSONE 5 MG KIT   Oral   Take 1 kit (5 mg total) by mouth daily after breakfast. Prednisone 5 mg 6 day dosepack.  Take as directed.   1 kit   0     BP 137/66  Pulse 88  Temp(Src) 98.5 F (36.9 C)  Resp 16  SpO2 100%  Physical Exam  Nursing note and vitals reviewed. Constitutional: She is oriented to person, place, and time. She appears well-nourished.  Uncomfortable, obese   HENT:  Head: Normocephalic.  Mouth/Throat: Oropharynx is clear and moist.  Eyes: Conjunctivae are normal. Pupils are equal, round, and reactive to light.  Neck: Normal range of motion.  Cardiovascular: Normal rate.   Pulmonary/Chest: Effort normal.  Abdominal: Soft. Bowel sounds are normal. She exhibits  no distension. There is no tenderness. There is no rebound and no guarding.  Musculoskeletal: Normal range of motion.  + pilonidal abscess around 2 inches in diameter with fluctuance not involving rectum.   Neurological: She is alert and oriented to person, place, and time.  Skin: Skin is warm and dry.  Psychiatric: She has a normal mood and affect. Her behavior is normal. Judgment and thought content normal.    ED Course  Procedures (including critical care time)  INCISION AND DRAINAGE Performed by: Silverio Lay, DAVID Consent: Verbal consent obtained. Risks and benefits: risks, benefits and alternatives were discussed Type: abscess  Body area: pilonidal abscess  Anesthesia: local infiltration  Incision was made with a scalpel with 11 blade  Local anesthetic: lidocaine 2 % no epinephrine  Anesthetic total: 5 ml  Complexity: complex Blunt dissection to break up  loculations  Drainage: purulent  Drainage amount: 15 cc  Packing material: 1/4 in iodoform gauze  Patient tolerance: Patient tolerated the procedure well with no immediate complications.     Labs Reviewed - No data to display No results found.   No diagnosis found.    MDM  Maria Maddox is a 27 y.o. female here with large pilonidal abscess. I&D performed. I decided to pack due to the size of the abscess. Also d/c home on clinda given recurrent abscess in this area that makes me suspicious for MRSA. Recommend f/u in 2 days for wound check and will give short course of percocet for pain. Return precautions given.         Richardean Canal, MD 09/07/12 [phone]

## 2012-09-09 ENCOUNTER — Emergency Department (HOSPITAL_COMMUNITY)
Admission: EM | Admit: 2012-09-09 | Discharge: 2012-09-09 | Disposition: A | Discharge status: Home / Self Care | Payer: 59 | Source: Home / Self Care

## 2012-09-09 ENCOUNTER — Encounter (HOSPITAL_COMMUNITY): Payer: Self-pay | Admitting: Emergency Medicine

## 2012-09-09 DIAGNOSIS — L0231 Cutaneous abscess of buttock: Secondary | ICD-10-CM

## 2012-09-09 NOTE — ED Notes (Signed)
Multiple questions , asking for physician to explain, notified david mabe pa

## 2012-09-09 NOTE — ED Notes (Signed)
Reports wound i/d this past Sunday at [facility].  Patient instructed to come to urgent care wfor packing removal.

## 2012-09-09 NOTE — ED Provider Notes (Signed)
History    CSN: [id number] Arrival date & time 09/09/12  1142  First MD Initiated Contact with Patient 09/09/12 1256     Chief Complaint  Patient presents with  . Wound Check   (Consider location/radiation/quality/duration/timing/severity/associated sxs/prior Treatment) HPI Comments: 27 year old female was in the emergency department 3 days ago with an abscess to the superior portion of the gluteal cleft. She is here today to have a wound check and packing removal.  History reviewed. No pertinent past medical history. Past Surgical History  Procedure Laterality Date  . Eye surgery      right side, numerous surgeries (now blind)   No family history on file. History  Substance Use Topics  . Smoking status: Never Smoker   . Smokeless tobacco: Not on file  . Alcohol Use: No   OB History   Grav Para Term Preterm Abortions TAB SAB Ect Mult Living                 Review of Systems  Skin: Positive for wound.  All other systems reviewed and are negative.    Allergies  Review of patient's allergies indicates no known allergies.  Home Medications   Current Outpatient Rx  Name  Route  Sig  Dispense  Refill  . EXPIRED: albuterol (PROVENTIL HFA;VENTOLIN HFA) 108 (90 BASE) MCG/ACT inhaler   Inhalation   Inhale 1-2 puffs into the lungs every 6 (six) hours as needed for wheezing.   1 Inhaler   0   . brimonidine (ALPHAGAN) 0.2 % ophthalmic solution   Right Eye   Place 1 drop into the right eye daily.         . clindamycin (CLEOCIN) 300 MG capsule   Oral   Take 1 capsule (300 mg total) by mouth 4 (four) times daily. X 7 days   28 capsule   0   . dorzolamide-timolol (COSOPT) 22.3-6.8 MG/ML ophthalmic solution   Right Eye   Place 1 drop into the right eye daily.         Marland Kitchen EXPIRED: fluticasone (FLONASE) 50 MCG/ACT nasal spray   Nasal   Place 2 sprays into the nose daily.   16 g   2   . oxyCODONE-acetaminophen (PERCOCET) 5-325 MG per tablet      1-2 tablets  every 6 hrs as needed for pain   15 tablet   0    BP 114/62  Pulse 74  Temp(Src) 98.3 F (36.8 C) (Oral)  Resp 16  SpO2 100% Physical Exam  Nursing note and vitals reviewed. Constitutional: She is oriented to person, place, and time. She appears well-developed and well-nourished. No distress.  Neurological: She is alert and oriented to person, place, and time. She exhibits normal muscle tone.  Skin: Skin is warm and dry.  The wound is located in the gluteal cleft. There continues to be regional soreness and tenderness.    ED Course  Procedures (including critical care time) Labs Reviewed - No data to display No results found. No diagnosis found.  MDM  REpacked wound with 4 cm of NuGauze and replaced dressing. Remove it in 2 days. Warm compresses. The packing was removed. Following removal of the packing the wound is was expressed and obtained approximately 10 cc of purulent material. The patient is on clindamycin but I could not see where a culture was obtained. She will continue the clindamycin as directed and keep the packing in for the next 2 days. Any new symptoms problems or worsening may  return.  Hayden Rasmussen, NP 09/09/12 1339

## 2012-09-23 NOTE — ED Provider Notes (Signed)
Medical screening examination/treatment/procedure(s) were performed by resident physician or non-physician practitioner and as supervising physician I was immediately available for consultation/collaboration.   Delylah Stanczyk DOUGLAS MD.   Tadhg Eskew D Katasha Riga, MD 09/23/12 0839 

## 2012-11-27 ENCOUNTER — Telehealth: Payer: Self-pay

## 2012-11-27 NOTE — Telephone Encounter (Signed)
Opened in error

## 2012-12-26 ENCOUNTER — Ambulatory Visit: Payer: Self-pay | Admitting: Family Medicine

## 2013-01-13 ENCOUNTER — Ambulatory Visit (INDEPENDENT_AMBULATORY_CARE_PROVIDER_SITE_OTHER): Payer: 59 | Admitting: Family Medicine

## 2013-01-13 ENCOUNTER — Encounter: Payer: Self-pay | Admitting: Family Medicine

## 2013-01-13 VITALS — BP 120/58 | HR 78 | Temp 98.6°F | Resp 18 | Ht 67.0 in | Wt 278.0 lb

## 2013-01-13 DIAGNOSIS — Z0000 Encounter for general adult medical examination without abnormal findings: Secondary | ICD-10-CM

## 2013-01-13 DIAGNOSIS — Z13220 Encounter for screening for lipoid disorders: Secondary | ICD-10-CM

## 2013-01-13 DIAGNOSIS — J019 Acute sinusitis, unspecified: Secondary | ICD-10-CM

## 2013-01-13 DIAGNOSIS — Z124 Encounter for screening for malignant neoplasm of cervix: Secondary | ICD-10-CM

## 2013-01-13 DIAGNOSIS — L538 Other specified erythematous conditions: Secondary | ICD-10-CM

## 2013-01-13 DIAGNOSIS — E282 Polycystic ovarian syndrome: Secondary | ICD-10-CM

## 2013-01-13 DIAGNOSIS — L304 Erythema intertrigo: Secondary | ICD-10-CM

## 2013-01-13 MED ORDER — CLOTRIMAZOLE-BETAMETHASONE 1-0.05 % EX CREA: TOPICAL_CREAM | Freq: Two times a day (BID) | CUTANEOUS | Status: DC

## 2013-01-13 MED ORDER — FLUTICASONE PROPIONATE 50 MCG/ACT NA SUSP: 2.0000 | Freq: Every day | NASAL | Status: DC

## 2013-01-13 MED ORDER — AMOXICILLIN 500 MG PO CAPS: 500.0000 mg | ORAL_CAPSULE | Freq: Three times a day (TID) | ORAL | Status: DC

## 2013-01-13 NOTE — Patient Instructions (Addendum)
Records Release - [facility]- Zel[place] [phone] I recommend eye visit once a year I recommend dental visit every 6 months Goal is to  Exercise 30 minutes 5 days a week We will call with lab results  Try to decrease to 1800 calories a day  Use the flonase and antibiotics for your sinuses Take mucinex for sinuses or decongestant Use cream for legs twice a day F/U as needed or for physical in 1 year

## 2013-01-14 ENCOUNTER — Telehealth: Payer: Self-pay | Admitting: Family Medicine

## 2013-01-14 ENCOUNTER — Other Ambulatory Visit: Payer: Self-pay | Admitting: Family Medicine

## 2013-01-14 LAB — PAP THINPREP ASCUS RFLX HPV RFLX TYPE

## 2013-01-14 MED ORDER — AMOXICILLIN 500 MG PO CAPS: 500.0000 mg | ORAL_CAPSULE | Freq: Three times a day (TID) | ORAL | Status: DC

## 2013-01-14 MED ORDER — CLOTRIMAZOLE-BETAMETHASONE 1-0.05 % EX CREA: TOPICAL_CREAM | Freq: Two times a day (BID) | CUTANEOUS | Status: DC

## 2013-01-14 NOTE — Telephone Encounter (Signed)
We were supposed to have called in an antifungal cream and Amoxicillin yesterday to [facility]. They have no record of Rx"s

## 2013-01-14 NOTE — Telephone Encounter (Signed)
meds resent back to pharmacy pt request

## 2013-01-14 NOTE — Telephone Encounter (Signed)
meds resent to [facility] at pt request

## 2013-01-15 ENCOUNTER — Other Ambulatory Visit: Payer: Self-pay | Admitting: Family Medicine

## 2013-01-15 ENCOUNTER — Encounter: Payer: Self-pay | Admitting: Family Medicine

## 2013-01-15 DIAGNOSIS — E282 Polycystic ovarian syndrome: Secondary | ICD-10-CM | POA: Insufficient documentation

## 2013-01-15 DIAGNOSIS — J019 Acute sinusitis, unspecified: Secondary | ICD-10-CM | POA: Insufficient documentation

## 2013-01-15 DIAGNOSIS — L304 Erythema intertrigo: Secondary | ICD-10-CM | POA: Insufficient documentation

## 2013-01-15 MED ORDER — CLOTRIMAZOLE-BETAMETHASONE 1-0.05 % EX CREA: TOPICAL_CREAM | Freq: Two times a day (BID) | CUTANEOUS | Status: DC

## 2013-01-15 MED ORDER — AMOXICILLIN 500 MG PO CAPS: 500.0000 mg | ORAL_CAPSULE | Freq: Three times a day (TID) | ORAL | Status: DC

## 2013-01-15 NOTE — Progress Notes (Signed)
  Subjective:    Patient ID: Maria Maddox, female    DOB: 1985-07-30, 27 y.o.   MRN: [id number]  HPI   Pt here to establish care and for CPE with PAP Smear. No PCP > 5 years. Was followed by [facility] due to PCOS and infertility. Last PAP Smear 2-3 years ago. Complains of sinus pressure and drainage for past 3-4 weeks, feels like it worsening, also feels a knot beside her left ear. Has history of sinus problems. Rash bilateral inner thighs which flares up monthly, worse with moisture, has tried a steroid cream with minimal improvement, +itching. 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    Review of Systems  GEN- denies fatigue, fever, weight loss,weakness, recent illness HEENT- denies eye drainage, change in vision,+nasal discharge, CVS- denies chest pain, palpitations RESP- denies SOB, cough, wheeze ABD- denies N/V, change in stools, abd pain GU- denies dysuria, hematuria, dribbling, incontinence MSK- denies joint pain, muscle aches, injury Neuro- denies headache, dizziness, syncope, seizure activity      Objective:   Physical Exam GEN- NAD, alert and oriented x3, obese HEENT- PERL left eye, Right eye blind NR, EOMI, non injected sclera, pink conjunctiva, MMM, oropharynx clear, TM clear bilat no effusion, +maxillary sinus tenderness, +inflammed turbinates,  Nasal drainage  Neck- Supple, shotty LAD Breast- normal symmetry, no nipple inversion,no nipple drainage, no nodules or lumps felt Nodes- no axillary nodes CVS- RRR, no murmur RESP-CTAB ABD-NABS,soft,NT,ND GU- normal external genitalia, vaginal mucosa pink and moist, cervix visualized no growth, no blood form os, minimal thin clear discharge, no CMT, no ovarian masses, uterus normal size EXT- No edema Pulses- Radial, DP- 2+ PSYCH- Normal affect and mood Skin- raised hyperpigemented plaque like rash groin and bilateral inner thigh      Assessment & Plan:    CPE- PAP Smear done, fasting labs, immunizations UTD

## 2013-01-15 NOTE — Telephone Encounter (Signed)
Resent again to [facility]

## 2013-01-15 NOTE — Assessment & Plan Note (Signed)
Flonase, antibiotics 

## 2013-01-15 NOTE — Assessment & Plan Note (Signed)
Lotrisone, based on location

## 2013-01-15 NOTE — Telephone Encounter (Signed)
Please resend prescriptions to her pharmacy . The pharmacy is saying that they never received them.

## 2013-02-23 ENCOUNTER — Encounter (HOSPITAL_COMMUNITY): Payer: Self-pay | Admitting: Emergency Medicine

## 2013-02-23 ENCOUNTER — Emergency Department (HOSPITAL_COMMUNITY)
Admission: EM | Admit: 2013-02-23 | Discharge: 2013-02-23 | Disposition: A | Discharge status: Home / Self Care | Payer: 59 | Source: Home / Self Care

## 2013-02-23 DIAGNOSIS — J069 Acute upper respiratory infection, unspecified: Secondary | ICD-10-CM

## 2013-02-23 MED ORDER — IPRATROPIUM BROMIDE 0.06 % NA SOLN: 2.0000 | Freq: Four times a day (QID) | NASAL | Status: DC

## 2013-02-23 MED ORDER — GUAIFENESIN-CODEINE 100-10 MG/5ML PO SOLN: 5.0000 mL | Freq: Every evening | ORAL | Status: DC | PRN

## 2013-02-23 MED ORDER — AZITHROMYCIN 250 MG PO TABS: 250.0000 mg | ORAL_TABLET | Freq: Every day | ORAL | Status: DC

## 2013-02-23 NOTE — ED Notes (Signed)
Reported URI type syx x 1 week, no relief w OTC medication

## 2013-02-23 NOTE — ED Provider Notes (Signed)
Maria Maddox is a 27 y.o. female who presents to Urgent Care today for sore throat cough nasal pressure and bilateral ear pain present for a few days. Patient has tried over-the-counter medications which have been somewhat effective. No shortness of breath cough nausea vomiting diarrhea or chest pain.   Past Medical History  Diagnosis Date  . Glaucoma     has had for 10 yrears  . Eczema   . Legally blind in right eye, as defined in [place]   . PCOS (polycystic ovarian syndrome)    History  Substance Use Topics  . Smoking status: Never Smoker   . Smokeless tobacco: Not on file  . Alcohol Use: No   ROS as above Medications reviewed. No current facility-administered medications for this encounter.   Current Outpatient Prescriptions  Medication Sig Dispense Refill  . dorzolamide-timolol (COSOPT) 22.3-6.8 MG/ML ophthalmic solution Place 1 drop into the right eye daily.      Marland Kitchen albuterol (PROVENTIL HFA;VENTOLIN HFA) 108 (90 BASE) MCG/ACT inhaler Inhale 1-2 puffs into the lungs every 6 (six) hours as needed for wheezing.  1 Inhaler  0  . azithromycin (ZITHROMAX) 250 MG tablet Take 1 tablet (250 mg total) by mouth daily. Take first 2 tablets together, then 1 every day until finished.  6 tablet  0  . clotrimazole-betamethasone (LOTRISONE) cream Apply topically 2 (two) times daily.  45 g  2  . fluticasone (FLONASE) 50 MCG/ACT nasal spray Place 2 sprays into the nose daily.  16 g  2  . fluticasone (FLONASE) 50 MCG/ACT nasal spray Place 2 sprays into the nose daily.  16 g  6  . guaiFENesin-codeine 100-10 MG/5ML syrup Take 5 mLs by mouth at bedtime as needed for cough.  120 mL  0  . ipratropium (ATROVENT) 0.06 % nasal spray Place 2 sprays into both nostrils 4 (four) times daily.  15 mL  1    Exam:  BP 119/67  Pulse 99  Temp(Src) 98.5 F (36.9 C) (Oral)  Resp 20  SpO2 98% Gen: Well , NAD morbidly obese HEENT: EOMI,  MMM, tympanic membranes are normal appearing bilaterally. Posterior pharynx  with cobblestoning. Lungs: Normal work of breathing. CTABL Heart: RRR no MRG Abd: NABS, Soft. NT, ND Exts: Non edematous BL  LE, warm and well perfused.   No results found for this or any previous visit (from the past 24 hour(s)). No results found.  Assessment and Plan: 27 y.o. female with viral URI. Plan for symptomatic management with Atrovent and codeine containing cough medication. We'll prescribe azithromycin to use if not improved. Discussed warning signs or symptoms. Please see discharge instructions. Patient expresses understanding.      Rodolph Bong, MD 02/23/13 [phone]

## 2013-05-11 ENCOUNTER — Emergency Department (HOSPITAL_COMMUNITY)
Admission: EM | Admit: 2013-05-11 | Discharge: 2013-05-11 | Disposition: A | Discharge status: Home / Self Care | Payer: 59 | Source: Home / Self Care | Attending: Family Medicine | Admitting: Family Medicine

## 2013-05-11 ENCOUNTER — Encounter (HOSPITAL_COMMUNITY): Payer: Self-pay | Admitting: Emergency Medicine

## 2013-05-11 DIAGNOSIS — J069 Acute upper respiratory infection, unspecified: Secondary | ICD-10-CM

## 2013-05-11 DIAGNOSIS — J029 Acute pharyngitis, unspecified: Secondary | ICD-10-CM

## 2013-05-11 LAB — POCT RAPID STREP A: Streptococcus, Group A Screen (Direct): NEGATIVE

## 2013-05-11 MED ORDER — PREDNISONE 10 MG PO TABS: 30.0000 mg | ORAL_TABLET | Freq: Every day | ORAL | Status: DC

## 2013-05-11 MED ORDER — IPRATROPIUM BROMIDE 0.06 % NA SOLN: 2.0000 | Freq: Four times a day (QID) | NASAL | Status: DC

## 2013-05-11 MED ORDER — GUAIFENESIN-CODEINE 100-10 MG/5ML PO SOLN: 5.0000 mL | Freq: Every evening | ORAL | Status: DC | PRN

## 2013-05-11 NOTE — Discharge Instructions (Signed)
Thank you for coming in today. Take prednisone daily for 5 days.  This may worsen your glaucoma. Please followup with your eye doctor. Use the codeine containing cough medication at bedtime as needed. Use the nasal spray. Take up to 2 Aleve twice daily for pain fevers chills or sore throat. Call or go to the emergency room if you get worse, have trouble breathing, have chest pains, or palpitations.  Viral Pharyngitis Viral pharyngitis is a viral infection that produces redness, pain, and swelling (inflammation) of the throat. It can spread from person to person (contagious). CAUSES Viral pharyngitis is caused by inhaling a large amount of certain germs called viruses. Many different viruses cause viral pharyngitis. SYMPTOMS Symptoms of viral pharyngitis include:  Sore throat.  Tiredness.  Stuffy nose.  Low-grade fever.  Congestion.  Cough. TREATMENT Treatment includes rest, drinking plenty of fluids, and the use of over-the-counter medication (approved by your caregiver). HOME CARE INSTRUCTIONS   Drink enough fluids to keep your urine clear or pale yellow.  Eat soft, cold foods such as ice cream, frozen ice pops, or gelatin dessert.  Gargle with warm salt water (1 tsp salt per 1 qt of water).  If over age 16, throat lozenges may be used safely.  Only take over-the-counter or prescription medicines for pain, discomfort, or fever as directed by your caregiver. Do not take aspirin. To help prevent spreading viral pharyngitis to others, avoid:  Mouth-to-mouth contact with others.  Sharing utensils for eating and drinking.  Coughing around others. SEEK MEDICAL CARE IF:   You are better in a few days, then become worse.  You have a fever or pain not helped by pain medicines.  There are any other changes that concern you. Document Released: 12/13/2004 Document Revised: 05/28/2011 Document Reviewed: 05/11/2010 [facility] Patient Information 2014 [place], [place].

## 2013-05-11 NOTE — ED Provider Notes (Signed)
Maria Maddox is a 28 y.o. female who presents to Urgent Care today for sore throat cough congestion runny nose fatigue sinus pressure and headache. Symptoms have been present for around 2 days. She's tried Sudafed and ibuprofen which has helped a bit. She denies any nausea vomiting or diarrhea. Her main medical problem is glaucoma. Sore throat symptoms are quite significant and worse with swallowing.   Past Medical History  Diagnosis Date  . Glaucoma     has had for 10 yrears  . Eczema   . Legally blind in right eye, as defined in [place]   . PCOS (polycystic ovarian syndrome)    History  Substance Use Topics  . Smoking status: Never Smoker   . Smokeless tobacco: Not on file  . Alcohol Use: No   ROS as above Medications: No current facility-administered medications for this encounter.   Current Outpatient Prescriptions  Medication Sig Dispense Refill  . albuterol (PROVENTIL HFA;VENTOLIN HFA) 108 (90 BASE) MCG/ACT inhaler Inhale 1-2 puffs into the lungs every 6 (six) hours as needed for wheezing.  1 Inhaler  0  . clotrimazole-betamethasone (LOTRISONE) cream Apply topically 2 (two) times daily.  45 g  2  . dorzolamide-timolol (COSOPT) 22.3-6.8 MG/ML ophthalmic solution Place 1 drop into the right eye daily.      . fluticasone (FLONASE) 50 MCG/ACT nasal spray Place 2 sprays into the nose daily.  16 g  2  . fluticasone (FLONASE) 50 MCG/ACT nasal spray Place 2 sprays into the nose daily.  16 g  6  . guaiFENesin-codeine 100-10 MG/5ML syrup Take 5 mLs by mouth at bedtime as needed for cough.  120 mL  0  . ipratropium (ATROVENT) 0.06 % nasal spray Place 2 sprays into both nostrils 4 (four) times daily.  15 mL  1  . predniSONE (DELTASONE) 10 MG tablet Take 3 tablets (30 mg total) by mouth daily.  15 tablet  0    Exam:  BP 109/66  Pulse 83  Temp(Src) 98.7 F (37.1 C) (Oral)  Resp 20  SpO2 100% Gen: Well NAD HEENT: EOMI,  MMM posterior pharynx with cobblestoning. Tympanic membranes are  normal appearing bilaterally Lungs: Normal work of breathing. CTABL Heart: RRR no MRG Abd: NABS, Soft. NT, ND Exts: Brisk capillary refill, warm and well perfused.   Results for orders placed during the hospital encounter of 05/11/13 (from the past 24 hour(s))  POCT RAPID STREP A ([place])     Status: None   Collection Time    05/11/13  5:16 PM      Result Value Ref Range   Streptococcus, Group A Screen (Direct) NEGATIVE  NEGATIVE   No results found.  Assessment and Plan: 28 y.o. female with viral URI and pharyngitis. Patient is quite symptomatic. Discussed risks and benefits of using prednisone with glaucoma. She requests prednisone after the discussion. Will use low dose and short duration. Additionally use Atrovent nasal spray for postnasal drip symptoms. Follow up with primary care provider as needed  Discussed warning signs or symptoms. Please see discharge instructions. Patient expresses understanding.    Gregor Hams, MD 05/11/13 [phone]

## 2013-05-11 NOTE — ED Notes (Signed)
C/o severe sore throat.  Bilateral ear pain.  Body aches.  Chills .  Symptoms present since Saturday.  Pt states symptoms worse last night.  Pain with swallowing.   No relief with otc meds.

## 2013-05-13 LAB — CULTURE, GROUP A STREP

## 2013-05-19 ENCOUNTER — Ambulatory Visit: Payer: 59 | Admitting: Family Medicine

## 2013-05-19 ENCOUNTER — Ambulatory Visit (INDEPENDENT_AMBULATORY_CARE_PROVIDER_SITE_OTHER): Payer: 59 | Admitting: Family Medicine

## 2013-05-19 ENCOUNTER — Encounter: Payer: Self-pay | Admitting: Family Medicine

## 2013-05-19 VITALS — BP 126/78 | HR 76 | Temp 98.4°F | Resp 20 | Ht 68.0 in | Wt 273.0 lb

## 2013-05-19 DIAGNOSIS — J029 Acute pharyngitis, unspecified: Secondary | ICD-10-CM

## 2013-05-19 DIAGNOSIS — J209 Acute bronchitis, unspecified: Secondary | ICD-10-CM

## 2013-05-19 MED ORDER — AZITHROMYCIN 250 MG PO TABS: ORAL_TABLET | ORAL | Status: DC

## 2013-05-19 MED ORDER — ALBUTEROL SULFATE HFA 108 (90 BASE) MCG/ACT IN AERS: 2.0000 | INHALATION_SPRAY | RESPIRATORY_TRACT | Status: DC | PRN

## 2013-05-19 NOTE — Progress Notes (Signed)
Patient ID: Maria Maddox, female   DOB: [birth year], 28 y.o.   MRN: [id number]     Subjective:    Patient ID: Maria Maddox, female    DOB: [birth year], 28 y.o.   MRN: [id number]  Patient presents for illness  patient here with persistent cough with minimal production wheezing short of breath with exertion and sore throat for past 10 days. She was seen at the urgent care on February 23 diagnosed with a viral illness and viral pharyngitis. She was prescribed Atrovent nasal as well as a cough medication and prednisone. She's not improved with his medications. Her cough has some production of thick yellow-brown sputum. She also finds herself wheezing especially when she gets a coughing fit or when she tries to walk up and down stairs. She has not had any fever recently but has still felt worn out from the coughing episodes. Her sore throat has also not improved over strep was negative back on the 23rd.    Review Of Systems:  GEN- denies fatigue, fever, weight loss,weakness, recent illness HEENT- denies eye drainage, change in vision, nasal discharge, CVS- denies chest pain, palpitations RESP- denies SOB, +cough, +wheeze Neuro- denies headache, dizziness, syncope, seizure activity       Objective:    BP 126/78  Pulse 76  Temp(Src) 98.4 F (36.9 C)  Resp 20  Ht 5\' 8"  (1.727 m)  Wt 273 lb (123.832 kg)  BMI 41.52 kg/m2  LMP 04/20/2013 GEN- NAD, alert and oriented x3 HEENT- left eye reactive, EOMI, non injected sclera, pink conjunctiva, MMM, oropharynx + injection, Right TM erythematous, no effusion, Left TM clear , nares clear, no maxillary sinus tenderness Neck- Supple, no LAD CVS- RRR, no murmur RESP-course BS, no wheeze, normal WOB, good air movement, harsh cough EXT- No edema Pulses- Radial 2+         Assessment & Plan:      Problem List Items Addressed This Visit   None    Visit Diagnoses   Acute bronchitis    -  Primary    Treat with albuterol, mucinex DM    Acute  pharyngitis        prolonged symptoms, given zpak       Note: This dictation was prepared with Dragon dictation along with smaller phrase technology. Any transcriptional errors that result from this process are unintentional.

## 2013-05-19 NOTE — Patient Instructions (Signed)
Bronchitis- use the albuterol and mucinex DM For the pharyngitis- sore throat- take the antibiotics

## 2013-08-07 ENCOUNTER — Ambulatory Visit: Payer: 59 | Admitting: Family Medicine

## 2013-08-21 ENCOUNTER — Ambulatory Visit (INDEPENDENT_AMBULATORY_CARE_PROVIDER_SITE_OTHER): Payer: 59 | Admitting: Family Medicine

## 2013-08-21 ENCOUNTER — Encounter: Payer: Self-pay | Admitting: Family Medicine

## 2013-08-21 ENCOUNTER — Other Ambulatory Visit: Payer: Self-pay | Admitting: Family Medicine

## 2013-08-21 VITALS — BP 110/72 | HR 76 | Temp 97.9°F | Resp 14 | Ht 67.0 in | Wt 277.0 lb

## 2013-08-21 DIAGNOSIS — M7070 Other bursitis of hip, unspecified hip: Secondary | ICD-10-CM

## 2013-08-21 DIAGNOSIS — M654 Radial styloid tenosynovitis [de Quervain]: Secondary | ICD-10-CM

## 2013-08-21 DIAGNOSIS — M76899 Other specified enthesopathies of unspecified lower limb, excluding foot: Secondary | ICD-10-CM

## 2013-08-21 DIAGNOSIS — E282 Polycystic ovarian syndrome: Secondary | ICD-10-CM

## 2013-08-21 MED ORDER — METFORMIN HCL 500 MG PO TABS: 500.0000 mg | ORAL_TABLET | Freq: Two times a day (BID) | ORAL | Status: DC

## 2013-08-21 MED ORDER — NAPROXEN 500 MG PO TABS: 500.0000 mg | ORAL_TABLET | Freq: Two times a day (BID) | ORAL | Status: DC

## 2013-08-21 NOTE — Assessment & Plan Note (Signed)
Start naprosyn, ICE, wrist brace

## 2013-08-21 NOTE — Patient Instructions (Signed)
Start Metformin 500mg  with dinner for 1 week, then increase to breakfast and dinner We will call with results  Take the anti-inflammatory medication F/U as needed

## 2013-08-21 NOTE — Progress Notes (Signed)
Patient ID: Maria Maddox, female   DOB: 12-Jan-1986, 28 y.o.   MRN: [id number]   Subjective:    Patient ID: Maria Maddox, female    DOB: 11/20/1985, 28 y.o.   MRN: [id number]  Patient presents for R wrist pain, R hip pain and PCOS  patient here with a few concerns. She has no history of PCO S. she was on metformin in the past however when off the medication. She would like to restart that she has noticed more weight gain as well as more hirsutism- now having to wax, and acne breakouts. This was better controlled when she was on the metformin. She's also due for fasting labs she never had her labs done when she had her physical exam a few months back.  She complains of right wrist pain on and off for about a month or so. She's had tendinitis in the wrist before. 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 She's not taking any over-the-counter medications but did try her previous wrist brace to help some but after a while will become uncomfortable.  Right hip pain this is been present on and off for many years. She is no known injury. She understands that her weight is contributing. His pain was just a lie on her right side. She's not taking any medications to help with the pain. She denies any back pain no numbness or tingling or paresthesias in her lower extremities    Review Of Systems:  GEN- denies fatigue, fever, weight loss,weakness, recent illness HEENT- denies eye drainage, change in vision, nasal discharge, CVS- denies chest pain, palpitations RESP- denies SOB, cough, wheeze ABD- denies N/V, change in stools, abd pain GU- denies dysuria, hematuria, dribbling, incontinence MSK- + joint pain, muscle aches, injury Neuro- denies headache, dizziness, syncope, seizure activity       Objective:    BP 110/72  Pulse 76  Temp(Src) 97.9 F (36.6 C) (Oral)  Resp 14  Ht 5\' 7"  (1.702 m)  Wt 277 lb (125.646 kg)  BMI 43.37 kg/m2 GEN- NAD, alert and oriented x3, Obese HEENT-  PERRL, EOMI, non injected sclera, pink conjunctiva, MMM, oropharynx clear Neck- Supple, no thyromegaly CVS- RRR, no murmur RESP-CTAB MSK- Right wrist, pain with flexion, +finklestiens, no swelling, no bony abnormality  Back- Spine NT, neg SLR, good ROM  Hip- TTP over right greater trochanter region, good ROM, no pain with ROM, non antalgic gait  Strength equal bilat in LE Skin- hirsutism beneath chin, mild acne on forehead and chin EXT- No edema Pulses- Radial, DP- 2+        Assessment & Plan:      Problem List Items Addressed This Visit   PCOS (polycystic ovarian syndrome) - Primary     Check fasting labs, known PCOS Restart Metformin 500mg  BID    Relevant Orders      FSH/LH      Hemoglobin A1c   Morbid obesity   Relevant Medications      metFORMIN (GLUCOPHAGE) tablet   De Quervain's tenosynovitis, right     Start naprosyn, ICE, wrist brace    Bursitis, hip     Naprosyn, discussed weight loss       Note: This dictation was prepared with Dragon dictation along with smaller Company secretary. Any transcriptional errors that result from this process are unintentional.

## 2013-08-21 NOTE — Assessment & Plan Note (Signed)
Check fasting labs, known PCOS Restart Metformin 500mg  BID

## 2013-08-21 NOTE — Assessment & Plan Note (Signed)
Naprosyn, discussed weight loss

## 2013-08-22 LAB — LIPID PANEL
CHOL/HDL RATIO: 4.6 ratio
Cholesterol: 201 mg/dL — ABNORMAL HIGH (ref 0–200)
HDL: 44 mg/dL (ref >39)
LDL CALC: 138 mg/dL — AB (ref 0–99)
Triglycerides: 93 mg/dL (ref <150)
VLDL: 19 mg/dL (ref 0–40)

## 2013-08-22 LAB — COMPREHENSIVE METABOLIC PANEL
ALK PHOS: 53 U/L (ref 39–117)
ALT: 14 U/L (ref 0–35)
AST: 13 U/L (ref 0–37)
Albumin: 3.6 g/dL (ref 3.5–5.2)
BUN: 10 mg/dL (ref 6–23)
CO2: 27 mEq/L (ref 19–32)
CREATININE: 0.71 mg/dL (ref 0.50–1.10)
Calcium: 9.2 mg/dL (ref 8.4–10.5)
Chloride: 104 mEq/L (ref 96–112)
Glucose, Bld: 73 mg/dL (ref 70–99)
Potassium: 4.1 mEq/L (ref 3.5–5.3)
Sodium: 138 mEq/L (ref 135–145)
Total Bilirubin: 0.4 mg/dL (ref 0.2–1.2)
Total Protein: 7.5 g/dL (ref 6.0–8.3)

## 2013-08-22 LAB — HEMOGLOBIN A1C
Hgb A1c MFr Bld: 6.9 % — ABNORMAL HIGH (ref <5.7)
Mean Plasma Glucose: 151 mg/dL — ABNORMAL HIGH (ref <117)

## 2013-08-22 LAB — CBC WITH DIFFERENTIAL/PLATELET
BASOS ABS: 0 10*3/uL (ref 0.0–0.1)
BASOS PCT: 0 % (ref 0–1)
EOS PCT: 4 % (ref 0–5)
Eosinophils Absolute: 0.3 10*3/uL (ref 0.0–0.7)
HCT: 39.7 % (ref 36.0–46.0)
Hemoglobin: 13.6 g/dL (ref 12.0–15.0)
Lymphocytes Relative: 49 % — ABNORMAL HIGH (ref 12–46)
Lymphs Abs: 3.2 10*3/uL (ref 0.7–4.0)
MCH: 28.2 pg (ref 26.0–34.0)
MCHC: 34.3 g/dL (ref 30.0–36.0)
MCV: 82.4 fL (ref 78.0–100.0)
MONO ABS: 0.4 10*3/uL (ref 0.1–1.0)
Monocytes Relative: 6 % (ref 3–12)
NEUTROS ABS: 2.7 10*3/uL (ref 1.7–7.7)
Neutrophils Relative %: 41 % — ABNORMAL LOW (ref 43–77)
Platelets: 314 10*3/uL (ref 150–400)
RBC: 4.82 MIL/uL (ref 3.87–5.11)
RDW: 14.1 % (ref 11.5–15.5)
WBC: 6.6 10*3/uL (ref 4.0–10.5)

## 2013-08-22 LAB — FSH/LH
FSH: 6.7 m[IU]/mL
LH: 8 m[IU]/mL

## 2013-08-22 LAB — TSH: TSH: 2.583 u[IU]/mL (ref 0.350–4.500)

## 2013-08-24 ENCOUNTER — Encounter: Payer: Self-pay | Admitting: *Deleted

## 2013-09-29 ENCOUNTER — Telehealth: Payer: Self-pay | Admitting: *Deleted

## 2013-09-29 DIAGNOSIS — M545 Low back pain, unspecified: Secondary | ICD-10-CM

## 2013-09-29 DIAGNOSIS — M25551 Pain in right hip: Secondary | ICD-10-CM

## 2013-09-29 DIAGNOSIS — M25552 Pain in left hip: Secondary | ICD-10-CM

## 2013-09-29 NOTE — Telephone Encounter (Signed)
Call placed to patient.   Patient reports that she has increased pain to hips. States that Naproxen is not alleviating pain.   Also reports that she is having frequent bouts of constipation and she believes that metformin is causing this.   MD please advise.

## 2013-09-29 NOTE — Telephone Encounter (Signed)
Message copied by Sheral Flow on Tue Sep 29, 2013  3:06 PM ------      Message from: Lenore Manner      Created: Tue Sep 29, 2013  2:54 PM      Regarding: not better      Contact: [phone]       Pt is calling because she is not better. She is still in a lot of pain with her hips, the pain is going down into her legs. She is wanting to know if her metformin could be changed it is causing her to be constipated.  ------

## 2013-09-30 MED ORDER — TRAMADOL HCL 50 MG PO TABS: 50.0000 mg | ORAL_TABLET | Freq: Four times a day (QID) | ORAL | Status: DC | PRN

## 2013-09-30 NOTE — Telephone Encounter (Signed)
Patient returned call and made aware.

## 2013-09-30 NOTE — Telephone Encounter (Signed)
Orders placed.   Medication called to pharmacy.  Call placed to patient. [place].

## 2013-09-30 NOTE — Telephone Encounter (Signed)
Please place order for xray of L spine and Bilat hips- Dx, Back pain, hip pain  Have her take 1/2 tablet metformin twice a day   Send Ultram 50mg  po q 6 hours prn pain #30  R 1

## 2014-02-11 ENCOUNTER — Encounter (HOSPITAL_COMMUNITY): Payer: Self-pay | Admitting: Emergency Medicine

## 2014-02-11 ENCOUNTER — Emergency Department (HOSPITAL_COMMUNITY)
Admission: EM | Admit: 2014-02-11 | Discharge: 2014-02-11 | Disposition: A | Discharge status: Home / Self Care | Payer: 59 | Attending: Emergency Medicine | Admitting: Emergency Medicine

## 2014-02-11 DIAGNOSIS — H548 Legal blindness, as defined in USA: Secondary | ICD-10-CM | POA: Diagnosis not present

## 2014-02-11 DIAGNOSIS — Z79899 Other long term (current) drug therapy: Secondary | ICD-10-CM | POA: Insufficient documentation

## 2014-02-11 DIAGNOSIS — B349 Viral infection, unspecified: Secondary | ICD-10-CM | POA: Diagnosis not present

## 2014-02-11 DIAGNOSIS — Z7951 Long term (current) use of inhaled steroids: Secondary | ICD-10-CM | POA: Diagnosis not present

## 2014-02-11 DIAGNOSIS — H409 Unspecified glaucoma: Secondary | ICD-10-CM | POA: Insufficient documentation

## 2014-02-11 DIAGNOSIS — R51 Headache: Secondary | ICD-10-CM | POA: Diagnosis present

## 2014-02-11 DIAGNOSIS — Z791 Long term (current) use of non-steroidal anti-inflammatories (NSAID): Secondary | ICD-10-CM | POA: Diagnosis not present

## 2014-02-11 LAB — URINE MICROSCOPIC-ADD ON

## 2014-02-11 LAB — URINALYSIS, ROUTINE W REFLEX MICROSCOPIC
BILIRUBIN URINE: NEGATIVE
Glucose, UA: NEGATIVE mg/dL
Ketones, ur: NEGATIVE mg/dL
NITRITE: NEGATIVE
Protein, ur: NEGATIVE mg/dL
Specific Gravity, Urine: 1.02 (ref 1.005–1.030)
UROBILINOGEN UA: 0.2 mg/dL (ref 0.0–1.0)
pH: 5.5 (ref 5.0–8.0)

## 2014-02-11 LAB — I-STAT CHEM 8, ED
CREATININE: 0.9 mg/dL (ref 0.50–1.10)
Calcium, Ion: 1.21 mmol/L (ref 1.12–1.23)
Chloride: 104 mEq/L (ref 96–112)
GLUCOSE: 84 mg/dL (ref 70–99)
HCT: 42 % (ref 36.0–46.0)
HEMOGLOBIN: 14.3 g/dL (ref 12.0–15.0)
Potassium: 3.7 mEq/L (ref 3.7–5.3)
Sodium: 140 mEq/L (ref 137–147)
TCO2: 23 mmol/L (ref 0–100)

## 2014-02-11 LAB — POC URINE PREG, ED: Preg Test, Ur: NEGATIVE

## 2014-02-11 MED ORDER — ONDANSETRON HCL 4 MG/2ML IJ SOLN: 4.0000 mg | Freq: Once | INTRAMUSCULAR | Status: DC

## 2014-02-11 MED ORDER — BENZONATATE 100 MG PO CAPS: 100.0000 mg | ORAL_CAPSULE | Freq: Three times a day (TID) | ORAL | Status: DC

## 2014-02-11 MED ORDER — GUAIFENESIN-CODEINE 100-10 MG/5ML PO SOLN: 10.0000 mL | Freq: Three times a day (TID) | ORAL | Status: DC | PRN

## 2014-02-11 MED ORDER — METOCLOPRAMIDE HCL 5 MG/ML IJ SOLN
10.0000 mg | Freq: Once | INTRAMUSCULAR | Status: AC
  Administered 2014-02-11: 10 mg via INTRAVENOUS
  Filled 2014-02-11: qty 2

## 2014-02-11 MED ORDER — ONDANSETRON 4 MG PO TBDP
4.0000 mg | ORAL_TABLET | Freq: Once | ORAL | Status: AC
  Administered 2014-02-11: 4 mg via ORAL
  Filled 2014-02-11: qty 1

## 2014-02-11 MED ORDER — HYDROCODONE-ACETAMINOPHEN 5-325 MG PO TABS
1.0000 | ORAL_TABLET | Freq: Once | ORAL | Status: AC
  Administered 2014-02-11: 1 via ORAL
  Filled 2014-02-11: qty 1

## 2014-02-11 MED ORDER — KETOROLAC TROMETHAMINE 30 MG/ML IJ SOLN
30.0000 mg | Freq: Once | INTRAMUSCULAR | Status: AC
  Administered 2014-02-11: 30 mg via INTRAVENOUS
  Filled 2014-02-11: qty 1

## 2014-02-11 MED ORDER — SODIUM CHLORIDE 0.9 % IV BOLUS (SEPSIS)
1000.0000 mL | Freq: Once | INTRAVENOUS | Status: AC
  Administered 2014-02-11: 1000 mL via INTRAVENOUS

## 2014-02-11 NOTE — ED Provider Notes (Signed)
CSN: [id number]     Arrival date & time 02/11/14  1549 History   First MD Initiated Contact with Patient 02/11/14 1604     Chief Complaint  Patient presents with  . Headache     (Consider location/radiation/quality/duration/timing/severity/associated sxs/prior Treatment) HPI   Patient to the ER with multiple complaints. She has a PMH of glaucoma, eczema, PCOS and being legally blind in the right eye. She complains of headache, generalized, cough, nasal congestion, nausea and diarrhea. Her symptoms started this past Tuesday (3 days ago) and were mild but are now described as being severe. She has tried Tylenol, Flonase, Albuterol, and Mucinex for her symptoms and they help transiently but then the symptoms get worse. She has not had vomiting, back pain, dysuria, vaginal symptoms, change in vision, focal weakness, ear pain, sore throat, SOB or chest pains.  Past Medical History  Diagnosis Date  . Glaucoma     has had for 10 yrears  . Eczema   . Legally blind in right eye, as defined in [place]   . PCOS (polycystic ovarian syndrome)    Past Surgical History  Procedure Laterality Date  . Eye surgery      right side, numerous surgeries (now blind)   Family History  Problem Relation Age of Onset  . Arthritis Mother   . Depression Mother   . Diabetes Mother   . Hyperlipidemia Mother   . Arthritis Father   . Cancer Maternal Grandmother     breast  . Diabetes Paternal Grandmother   . Diabetes Paternal Grandfather   . Hearing loss Paternal Grandfather   . Cancer Maternal Aunt 50    Breast Cancer   History  Substance Use Topics  . Smoking status: Never Smoker   . Smokeless tobacco: Never Used  . Alcohol Use: No   OB History    No data available     Review of Systems  10 Systems reviewed and are negative for acute change except as noted in the HPI.     Allergies  Review of patient's allergies indicates no known allergies.  Home Medications   Prior to Admission  medications   Medication Sig Start Date End Date Taking? Authorizing Provider  brimonidine (ALPHAGAN P) 0.1 % SOLN Place 1 drop into both eyes 2 (two) times daily.   Yes Historical Provider, MD  dextromethorphan-guaiFENesin (MUCINEX DM) 30-600 MG per 12 hr tablet Take 1 tablet by mouth every 12 (twelve) hours.   Yes Historical Provider, MD  dorzolamide-timolol (COSOPT) 22.3-6.8 MG/ML ophthalmic solution Place 1 drop into the right eye daily.   Yes Historical Provider, MD  fluticasone (FLONASE) 50 MCG/ACT nasal spray Place 2 sprays into both nostrils daily.   Yes Historical Provider, MD  metFORMIN (GLUCOPHAGE) 500 MG tablet Take 1 tablet (500 mg total) by mouth 2 (two) times daily with a meal. 08/21/13  Yes Alycia Rossetti, MD  albuterol (PROVENTIL HFA;VENTOLIN HFA) 108 (90 BASE) MCG/ACT inhaler Inhale 2 puffs into the lungs every 4 (four) hours as needed for wheezing or shortness of breath. 05/19/13   Alycia Rossetti, MD  benzonatate (TESSALON) 100 MG capsule Take 1 capsule (100 mg total) by mouth every 8 (eight) hours. 02/11/14   Yachet Mattson Marilu Favre, PA-Maddox  guaiFENesin-codeine 100-10 MG/5ML syrup Take 10 mLs by mouth 3 (three) times daily as needed for cough. 02/11/14   Linus Mako, PA-Maddox  naproxen (NAPROSYN) 500 MG tablet Take 1 tablet (500 mg total) by mouth 2 (two) times daily with  a meal. Patient not taking: Reported on 02/11/2014 08/21/13   Alycia Rossetti, MD  traMADol (ULTRAM) 50 MG tablet Take 1 tablet (50 mg total) by mouth every 6 (six) hours as needed. 09/30/13   Alycia Rossetti, MD   BP 130/72 mmHg  Pulse 66  Temp(Src) 97.9 F (36.6 Maddox) (Oral)  Resp 20  SpO2 99%  LMP 01/28/2014 Physical Exam  Constitutional: She appears well-developed and well-nourished. No distress.  HENT:  Head: Normocephalic and atraumatic.  Right Ear: Tympanic membrane and ear canal normal.  Left Ear: Tympanic membrane and ear canal normal.  Nose: Nose normal.  Mouth/Throat: Uvula is midline, oropharynx is  clear and moist and mucous membranes are normal.  Eyes: Pupils are equal, round, and reactive to light.  Neck: Normal range of motion. Neck supple. No Brudzinski's sign and no Kernig's sign noted.  Cardiovascular: Normal rate and regular rhythm.   Pulmonary/Chest: Effort normal and breath sounds normal.  Pt coughs during exam  Abdominal: Soft. Bowel sounds are normal. She exhibits no distension. There is no tenderness. There is no rigidity and no guarding.  Neurological: She is alert.  Cranial nerves II-VIII and X-XII evaluated and show no deficits. Pt alert and oriented x 3 Upper and lower extremity strength is symmetrical and physiologic Normal muscular tone No facial droop Coordination intact No pronator drift  Skin: Skin is warm and dry.  Nursing note and vitals reviewed.   ED Course  Procedures (including critical care time) Labs Review Labs Reviewed  URINALYSIS, ROUTINE W REFLEX MICROSCOPIC - Abnormal; Notable for the following:    APPearance CLOUDY (*)    Hgb urine dipstick LARGE (*)    Leukocytes, UA SMALL (*)    All other components within normal limits  URINE MICROSCOPIC-ADD ON - Abnormal; Notable for the following:    Squamous Epithelial / LPF MANY (*)    All other components within normal limits  I-STAT CHEM 8, ED - Abnormal; Notable for the following:    BUN <3 (*)    All other components within normal limits  POC URINE PREG, ED    Imaging Review No results found.   EKG Interpretation None      MDM   Final diagnoses:  Viral syndrome   Medications  HYDROcodone-acetaminophen (NORCO/VICODIN) 5-325 MG per tablet 1 tablet (not administered)  ondansetron (ZOFRAN-ODT) disintegrating tablet 4 mg (not administered)  sodium chloride 0.9 % bolus 1,000 mL (0 mLs Intravenous Stopped 02/11/14 1715)  ketorolac (TORADOL) 30 MG/ML injection 30 mg (30 mg Intravenous Given 02/11/14 1636)  metoCLOPramide (REGLAN) injection 10 mg (10 mg Intravenous Given 02/11/14 1636)    Patient feeling significantly better. Otherwise she looks well. She has spotting on her UA and says she is currently spotting. She has a PCP that she will follow-up with. Will rx decongestant and cough medications. NO need for abx at this time.  28 y.o.Maria A Cobert's evaluation in the Emergency Department is complete. It has been determined that no acute conditions requiring further emergency intervention are present at this time. The patient/guardian have been advised of the diagnosis and plan. We have discussed signs and symptoms that warrant return to the ED, such as changes or worsening in symptoms.  Vital signs are stable at discharge. Filed Vitals:   02/11/14 1555  BP: 130/72  Pulse: 66  Temp: 97.9 F (36.6 Maddox)  Resp: 20    Patient/guardian has voiced understanding and agreed to follow-up with the PCP or specialist.  Linus Mako, PA-Maddox 02/11/14 1838  Ephraim Hamburger, MD 02/11/14 2203

## 2014-02-11 NOTE — ED Notes (Signed)
Pt presents to ED with c/o headache and congestion, onset last Tuesday. Pt also c/o generalized weakness, nausea and diarrhea---- reports that symptoms "just get worse and worse".

## 2014-02-11 NOTE — ED Notes (Signed)
Discharge instructions reviewed with patient--agrees and verbalized understanding Patient informed of need to make and keep follow up appointment--agrees and verbalized understanding DC Rx x 2 reviewed with patient--agrees and verbalized understanding VS updated and stable--reviewed with patient at time of DC--agrees and verbalized understanding Patient alert and oriented x 4 and in NAD at time of discharge Patient's female friend will be driving patient home at time of DC All questions related to this ED visit, DC instructions, DC prescriptions and follow up care answered to patient's satisfaction by this nurse

## 2014-02-11 NOTE — Discharge Instructions (Signed)

## 2014-05-24 ENCOUNTER — Emergency Department (HOSPITAL_COMMUNITY)
Admission: EM | Admit: 2014-05-24 | Discharge: 2014-05-24 | Disposition: A | Discharge status: Home / Self Care | Payer: 59 | Source: Home / Self Care | Attending: Emergency Medicine | Admitting: Emergency Medicine

## 2014-05-24 ENCOUNTER — Inpatient Hospital Stay (HOSPITAL_COMMUNITY)
Admission: AD | Admit: 2014-05-24 | Discharge: 2014-05-24 | Disposition: A | Discharge status: Home / Self Care | Payer: 59 | Source: Ambulatory Visit | Attending: Obstetrics & Gynecology | Admitting: Obstetrics & Gynecology

## 2014-05-24 ENCOUNTER — Other Ambulatory Visit (HOSPITAL_COMMUNITY)
Admission: RE | Admit: 2014-05-24 | Discharge: 2014-05-24 | Disposition: A | Discharge status: Home / Self Care | Payer: 59 | Source: Ambulatory Visit | Attending: Emergency Medicine | Admitting: Emergency Medicine

## 2014-05-24 ENCOUNTER — Encounter (HOSPITAL_COMMUNITY): Payer: Self-pay | Admitting: Emergency Medicine

## 2014-05-24 DIAGNOSIS — O039 Complete or unspecified spontaneous abortion without complication: Secondary | ICD-10-CM

## 2014-05-24 DIAGNOSIS — E282 Polycystic ovarian syndrome: Secondary | ICD-10-CM | POA: Diagnosis not present

## 2014-05-24 DIAGNOSIS — N926 Irregular menstruation, unspecified: Secondary | ICD-10-CM

## 2014-05-24 DIAGNOSIS — N760 Acute vaginitis: Secondary | ICD-10-CM | POA: Diagnosis present

## 2014-05-24 DIAGNOSIS — N920 Excessive and frequent menstruation with regular cycle: Secondary | ICD-10-CM | POA: Insufficient documentation

## 2014-05-24 DIAGNOSIS — H409 Unspecified glaucoma: Secondary | ICD-10-CM | POA: Insufficient documentation

## 2014-05-24 DIAGNOSIS — Z113 Encounter for screening for infections with a predominantly sexual mode of transmission: Secondary | ICD-10-CM | POA: Insufficient documentation

## 2014-05-24 DIAGNOSIS — H540 Blindness, both eyes: Secondary | ICD-10-CM | POA: Diagnosis not present

## 2014-05-24 LAB — CBC
HCT: 36 % (ref 36.0–46.0)
HEMOGLOBIN: 12.1 g/dL (ref 12.0–15.0)
MCH: 28.5 pg (ref 26.0–34.0)
MCHC: 33.6 g/dL (ref 30.0–36.0)
MCV: 84.9 fL (ref 78.0–100.0)
Platelets: 320 10*3/uL (ref 150–400)
RBC: 4.24 MIL/uL (ref 3.87–5.11)
RDW: 12.8 % (ref 11.5–15.5)
WBC: 7.1 10*3/uL (ref 4.0–10.5)

## 2014-05-24 LAB — ABO/RH: ABO/RH(D): AB POS

## 2014-05-24 LAB — POCT URINALYSIS DIP (DEVICE)
Bilirubin Urine: NEGATIVE
Glucose, UA: NEGATIVE mg/dL
Ketones, ur: NEGATIVE mg/dL
Leukocytes, UA: NEGATIVE
NITRITE: NEGATIVE
PH: 5.5 (ref 5.0–8.0)
PROTEIN: NEGATIVE mg/dL
UROBILINOGEN UA: 0.2 mg/dL (ref 0.0–1.0)

## 2014-05-24 LAB — HCG, QUANTITATIVE, PREGNANCY: hCG, Beta Chain, Quant, S: <1 m[IU]/mL (ref <5)

## 2014-05-24 LAB — POCT PREGNANCY, URINE: Preg Test, Ur: NEGATIVE

## 2014-05-24 NOTE — MAU Provider Note (Signed)
History     CSN: [id number]  Arrival date and time: 05/24/14 1814   None     Chief Complaint  Patient presents with  . Possible Pregnancy   HPI Maria Maddox is 29 y.o. sent to MAU after being evaluated by [facility].  Dr.Kenner called and reported patient has history of PCOS, trying to conceive, has bleeding.  Bleeding began 3 days ago, heavy at first, less now.  Having menstrual like cramping.  He reported seeing tissue on pelvic exam and sent here to r/o miscarriage.  [facility] UPT at [facility] was Neg.  She has not done a pregnancy test at home.  She reports a long history of PCOS.  When she eats correctly and exercises=weight loss she has monthly cycles.  She has had IVF consult and they suggested she loose weight first.  LMP first week of January. Thinks she is pregnant because she has lost weight and had several monthly cycles until missing Feb and March menses.  She is using ovulation predictor kits and has been having intercourse as suggested by [employer].  Dr. Myles Rosenthal is her PCP.    Past Medical History  Diagnosis Date  . Glaucoma     has had for 10 yrears  . Eczema   . Legally blind in right eye, as defined in [place]   . PCOS (polycystic ovarian syndrome)     Past Surgical History  Procedure Laterality Date  . Eye surgery      right side, numerous surgeries (now blind)    Family History  Problem Relation Age of Onset  . Arthritis Mother   . Depression Mother   . Diabetes Mother   . Hyperlipidemia Mother   . Arthritis Father   . Cancer Maternal Grandmother     breast  . Diabetes Paternal Grandmother   . Diabetes Paternal Grandfather   . Hearing loss Paternal Grandfather   . Cancer Maternal Aunt 50    Breast Cancer    History  Substance Use Topics  . Smoking status: Never Smoker   . Smokeless tobacco: Never Used  . Alcohol Use: No    Allergies: No Known Allergies  Prescriptions prior to admission  Medication Sig Dispense Refill Last Dose  . albuterol  (PROVENTIL HFA;VENTOLIN HFA) 108 (90 BASE) MCG/ACT inhaler Inhale 2 puffs into the lungs every 4 (four) hours as needed for wheezing or shortness of breath. 1 Inhaler 0 unknown at unknown time  . benzonatate (TESSALON) 100 MG capsule Take 1 capsule (100 mg total) by mouth every 8 (eight) hours. 21 capsule 0   . brimonidine (ALPHAGAN P) 0.1 % SOLN Place 1 drop into both eyes 2 (two) times daily.   02/11/2014 at Unknown time  . dextromethorphan-guaiFENesin (MUCINEX DM) 30-600 MG per 12 hr tablet Take 1 tablet by mouth every 12 (twelve) hours.   02/10/2014 at Unknown time  . dorzolamide-timolol (COSOPT) 22.3-6.8 MG/ML ophthalmic solution Place 1 drop into the right eye daily.   02/11/2014 at Unknown time  . fluticasone (FLONASE) 50 MCG/ACT nasal spray Place 2 sprays into both nostrils daily.   02/11/2014 at Unknown time  . guaiFENesin-codeine 100-10 MG/5ML syrup Take 10 mLs by mouth 3 (three) times daily as needed for cough. 120 mL 0   . metFORMIN (GLUCOPHAGE) 500 MG tablet Take 1 tablet (500 mg total) by mouth 2 (two) times daily with a meal. 60 tablet 3 Past Week at Unknown time  . naproxen (NAPROSYN) 500 MG tablet Take 1  tablet (500 mg total) by mouth 2 (two) times daily with a meal. (Patient not taking: Reported on 02/11/2014) 60 tablet 2   . traMADol (ULTRAM) 50 MG tablet Take 1 tablet (50 mg total) by mouth every 6 (six) hours as needed. 30 tablet 1 unknown at unknown time    Review of Systems  Constitutional: Negative for fever and chills.  Gastrointestinal: Positive for abdominal pain (cramping). Negative for nausea and vomiting.  Genitourinary: Negative for dysuria, urgency, frequency and hematuria.       Vaginal bleeding.  Neurological: Negative for headaches.   Physical Exam   Last menstrual period 05/21/2014.  Physical Exam  Constitutional: She is oriented to person, place, and time. She appears well-developed and well-nourished. No distress.  HENT:  Head: Normocephalic.   Genitourinary:  Exam done by Dr. [facility] repeated--patient does not report bleeding at this time.  Neurological: She is alert and oriented to person, place, and time.  Psychiatric: She has a normal mood and affect. Her behavior is normal. Thought content normal.   Results for orders placed or performed during the hospital encounter of 05/24/14 (from the past 24 hour(s))  CBC     Status: None   Collection Time: 05/24/14  7:15 PM  Result Value Ref Range   WBC 7.1 4.0 - 10.5 K/uL   RBC 4.24 3.87 - 5.11 MIL/uL   Hemoglobin 12.1 12.0 - 15.0 g/dL   HCT 36.0 36.0 - 46.0 %   MCV 84.9 78.0 - 100.0 fL   MCH 28.5 26.0 - 34.0 pg   MCHC 33.6 30.0 - 36.0 g/dL   RDW 12.8 11.5 - 15.5 %   Platelets 320 150 - 400 K/uL  hCG, quantitative, pregnancy     Status: None   Collection Time: 05/24/14  7:16 PM  Result Value Ref Range   hCG, Beta Chain, Quant, S <1 <5 mIU/mL  ABO/Rh     Status: None (Preliminary result)   Collection Time: 05/24/14  7:17 PM  Result Value Ref Range   ABO/RH(D) AB POS     MAU Course  Procedures  MDM Care turned over to J. Othell Diluzio, PA at 20:00 Patient is hemodynamically stable. Quant hCG < 1. No current or recent pregnancy. History of irregular menses.  Assessment and Plan  A:    Menorrahgia  A: Discharge home Bleeding precautions discussed Patient advised to follow-up with PCP and/or Fertility specialist as scheduled or sooner PRN Patient may return to MAU as needed or if her condition were to change or worsen    Luvenia Redden, PA-C  05/24/2014, 8:23 PM

## 2014-05-24 NOTE — MAU Note (Addendum)
Pt states began bleeding heavily Friday. States MD took out clots and tissue from her pelvic exam. Bleeding became worse last pm. Sent here for further eval. Discussed with pt regardless of miscarriage or healthy pregnancy, upt would be positive. Understands need to obtain BHCG>

## 2014-05-24 NOTE — ED Provider Notes (Signed)
Chief Complaint   Vaginal Bleeding   History of Present Illness   Maria Maddox is a 29 year old female with polycystic ovarian syndrome was been trying to get pregnant. Her last normal period was mid January and she has had some breast tenderness but no morning sickness. She began to have some light spotting 4 days ago. States she had heavy bleeding with clotting and cramping, she felt weak, she had pain, sweats, and nausea. She denies any fever. She did have a miscarriage about 6 years ago. This is been her only 2 pregnancies.   Review of Systems   Other than as noted above, the patient denies any of the following symptoms: Systemic:  No fever or chills GI:  No abdominal pain, nausea, vomiting, diarrhea, constipation, melena or hematochezia. GU:  No dysuria, frequency, urgency, hematuria, vaginal discharge, itching, or abnormal vaginal bleeding.  [place]   Past medical history, family history, social history, meds, and allergies were reviewed.    Physical Examination    Vital signs:  BP 126/83 mmHg  Pulse 85  Temp(Src) 98.2 F (36.8 C) (Oral)  Resp 16  SpO2 96%  LMP 03/25/2014 General:  Alert, oriented and in no distress. Lungs:  Breath sounds clear and equal bilaterally.  No wheezes, rales or rhonchi. Heart:  Regular rhythm.  No gallops or murmers. Abdomen:  Soft, flat and non-distended.  No organomegaly or mass.  No tenderness, guarding or rebound.  Bowel sounds normally active. Pelvic exam:  Normal external genitalia. Large blood in the vaginal vault with clots and some what appears to be tissue protruding from the cervical os. The cervical os was open. The tissue was grasped with a ring forceps and placed in a formalin sent off for pathology. She has mild to moderate pain on cervical motion. Uterus is normal in size and shape and moderately tender. She has moderate bilateral adnexal tenderness without any masses.  DNA probes for gonorrhea, Chlamydia, Trichomonas, Gardnerella,  Candida were obtained. Skin:  Clear, warm and dry.  Chaperoned by Tinnie Gens, CMA, who was present throughout the pelvic exam.   Labs   Results for orders placed or performed during the hospital encounter of 05/24/14  POCT urinalysis dip (device)  Result Value Ref Range   Glucose, UA NEGATIVE NEGATIVE mg/dL   Bilirubin Urine NEGATIVE NEGATIVE   Ketones, ur NEGATIVE NEGATIVE mg/dL   Specific Gravity, Urine >=1.030 1.005 - 1.030   Hgb urine dipstick LARGE (A) NEGATIVE   pH 5.5 5.0 - 8.0   Protein, ur NEGATIVE NEGATIVE mg/dL   Urobilinogen, UA 0.2 0.0 - 1.0 mg/dL   Nitrite NEGATIVE NEGATIVE   Leukocytes, UA NEGATIVE NEGATIVE  Pregnancy, urine POC  Result Value Ref Range   Preg Test, Ur NEGATIVE NEGATIVE    Assessment   The encounter diagnosis was Miscarriage.  Appendectomy she is having a miscarriage, even though her pregnancy test today was negative. She has not done all pregnancy test.       Plan    The patient was told to go directly to [facility] hospital, and have either husband and her friend drive her there, and not eat or drink anything on the way there. A report was called to the provider at the [facility] MAU.  Medical Decision Making:  29 year old female presents with a four-day history of vaginal bleeding which has been heavy today with cutting and cramping. Has been trying to get pregnant. On exam there is what appears to be tissue at the cervical  os and the os is open, suggestive of a miscarriage. Report was called to the provider at the MAU and patient told to go directly there for further evaluation.       Harden Mo, MD 05/24/14 [phone]

## 2014-05-24 NOTE — Discharge Instructions (Signed)
Menorrhagia  Menorrhagia is the medical term for when your menstrual periods are heavy or last longer than usual. With menorrhagia, every period you have may cause enough blood loss and cramping that you are unable to maintain your usual activities.  CAUSES   In some cases, the cause of heavy periods is unknown, but a number of conditions may cause menorrhagia. Common causes include:   A problem with the hormone-producing thyroid gland (hypothyroid).   Noncancerous growths in the uterus (polyps or fibroids).   An imbalance of the estrogen and progesterone hormones.   One of your ovaries not releasing an egg during one or more months.   Side effects of having an intrauterine device (IUD).   Side effects of some medicines, such as anti-inflammatory medicines or blood thinners.   A bleeding disorder that stops your blood from clotting normally.  SIGNS AND SYMPTOMS   During a normal period, bleeding lasts between 4 and 8 days. Signs that your periods are too heavy include:   You routinely have to change your pad or tampon every 1 or 2 hours because it is completely soaked.   You pass blood clots larger than 1 inch (2.5 cm) in size.   You have bleeding for more than 7 days.   You need to use pads and tampons at the same time because of heavy bleeding.   You need to wake up to change your pads or tampons during the night.   You have symptoms of anemia, such as tiredness, fatigue, or shortness of breath.  DIAGNOSIS   Your health care provider will perform a physical exam and ask you questions about your symptoms and menstrual history. Other tests may be ordered based on what the health care provider finds during the exam. These tests can include:   Blood tests. Blood tests are used to check if you are pregnant or have hormonal changes, a bleeding or thyroid disorder, low iron levels (anemia), or other problems.   Endometrial biopsy. Your health care provider takes a sample of tissue from the inside of your  uterus to be examined under a microscope.   Pelvic ultrasound. This test uses sound waves to make a picture of your uterus, ovaries, and vagina. The pictures can show if you have fibroids or other growths.   Hysteroscopy. For this test, your health care provider will use a small telescope to look inside your uterus.  Based on the results of your initial tests, your health care provider may recommend further testing.  TREATMENT   Treatment may not be needed. If it is needed, your health care provider may recommend treatment with one or more medicines first. If these do not reduce bleeding enough, a surgical treatment might be an option. The best treatment for you will depend on:    Whether you need to prevent pregnancy.   Your desire to have children in the future.   The cause and severity of your bleeding.   Your opinion and personal preference.   Medicines for menorrhagia may include:   Birth control methods that use hormones. These include the pill, skin patch, vaginal ring, shots that you get every 3 months, hormonal IUD, and implant. These treatments reduce bleeding during your menstrual period.   Medicines that thicken blood and slow bleeding.   Medicines that reduce swelling, such as ibuprofen.   Medicines that contain a synthetic hormone called progestin.    Medicines that make the ovaries stop working for a short time.     the lining of your uterus to reduce menstrual bleeding.  Operative hysteroscopy. This procedure uses a tiny tube with a light (hysteroscope) to view your uterine cavity and can help in the surgical removal of a polyp that may be causing heavy periods.  Endometrial ablation. Through various techniques, your health care  provider permanently destroys the entire lining of your uterus (endometrium). After endometrial ablation, most women have little or no menstrual flow. Endometrial ablation reduces your ability to become pregnant.  Endometrial resection. This surgical procedure uses an electrosurgical wire loop to remove the lining of the uterus. This procedure also reduces your ability to become pregnant.  Hysterectomy. Surgical removal of the uterus and cervix is a permanent procedure that stops menstrual periods. Pregnancy is not possible after a hysterectomy. This procedure requires anesthesia and hospitalization. HOME CARE INSTRUCTIONS   Only take over-the-counter or prescription medicines as directed by your health care provider. Take prescribed medicines exactly as directed. Do not change or switch medicines without consulting your health care provider.  Take any prescribed iron pills exactly as directed by your health care provider. Long-term heavy bleeding may result in low iron levels. Iron pills help replace the iron your body lost from heavy bleeding. Iron may cause constipation. If this becomes a problem, increase the bran, fruits, and roughage in your diet.  Do not take aspirin or medicines that contain aspirin 1 week before or during your menstrual period. Aspirin may make the bleeding worse.  If you need to change your sanitary pad or tampon more than once every 2 hours, stay in bed and rest as much as possible until the bleeding stops.  Eat well-balanced meals. Eat foods high in iron. Examples are leafy green vegetables, meat, liver, eggs, and whole grain breads and cereals. Do not try to lose weight until the abnormal bleeding has stopped and your blood iron level is back to normal. SEEK MEDICAL CARE IF:   You soak through a pad or tampon every 1 or 2 hours, and this happens every time you have a period.  You need to use pads and tampons at the same time because you are bleeding so much.  You  need to change your pad or tampon during the night.  You have a period that lasts for more than 8 days.  You pass clots bigger than 1 inch wide.  You have irregular periods that happen more or less often than once a month.  You feel dizzy or faint.  You feel very weak or tired.  You feel short of breath or feel your heart is beating too fast when you exercise.  You have nausea and vomiting or diarrhea while you are taking your medicine.  You have any problems that may be related to the medicine you are taking. SEEK IMMEDIATE MEDICAL CARE IF:   You soak through 4 or more pads or tampons in 2 hours.  You have any bleeding while you are pregnant. MAKE SURE YOU:   Understand these instructions.  Will watch your condition.  Will get help right away if you are not doing well or get worse. Document Released: 03/05/2005 Document Revised: 03/10/2013 Document Reviewed: 08/24/2012 [facility] Patient Information 2015 [place], [place]. This information is not intended to replace advice given to you by your health care provider. Make sure you discuss any questions you have with your health care provider. Polycystic Ovarian Syndrome Polycystic ovarian syndrome (PCOS) is a common hormonal disorder among women of reproductive age. Most women with PCOS  grow many small cysts on their ovaries. PCOS can cause problems with your periods and make it difficult to get pregnant. It can also cause an increased risk of miscarriage with pregnancy. If left untreated, PCOS can lead to serious health problems, such as diabetes and heart disease. CAUSES The cause of PCOS is not fully understood, but genetics may be a factor. SIGNS AND SYMPTOMS   Infrequent or no menstrual periods.   Inability to get pregnant (infertility) because of not ovulating.   Increased growth of hair on the face, chest, stomach, back, thumbs, thighs, or toes.   Acne, oily skin, or dandruff.   Pelvic pain.   Weight gain or  obesity, usually carrying extra weight around the waist.   Type 2 diabetes.   High cholesterol.   High blood pressure.   Female-pattern baldness or thinning hair.   Patches of thickened and dark brown or black skin on the neck, arms, breasts, or thighs.   Tiny excess flaps of skin (skin tags) in the armpits or neck area.   Excessive snoring and having breathing stop at times while asleep (sleep apnea).   Deepening of the voice.   Gestational diabetes when pregnant.  DIAGNOSIS  There is no single test to diagnose PCOS.   Your health care provider will:   Take a medical history.   Perform a pelvic exam.   Have ultrasonography done.   Check your female and female hormone levels.   Measure glucose or sugar levels in the blood.   Do other blood tests.   If you are producing too many female hormones, your health care provider will make sure it is from PCOS. At the physical exam, your health care provider will want to evaluate the areas of increased hair growth. Try to allow natural hair growth for a few days before the visit.   During a pelvic exam, the ovaries may be enlarged or swollen because of the increased number of small cysts. This can be seen more easily by using vaginal ultrasonography or screening to examine the ovaries and lining of the uterus (endometrium) for cysts. The uterine lining may become thicker if you have not been having a regular period.  TREATMENT  Because there is no cure for PCOS, it needs to be managed to prevent problems. Treatments are based on your symptoms. Treatment is also based on whether you want to have a baby or whether you need contraception.  Treatment may include:   Progesterone hormone to start a menstrual period.   Birth control pills to make you have regular menstrual periods.   Medicines to make you ovulate, if you want to get pregnant.   Medicines to control your insulin.   Medicine to control your blood  pressure.   Medicine and diet to control your high cholesterol and triglycerides in your blood.  Medicine to reduce excessive hair growth.  Surgery, making small holes in the ovary, to decrease the amount of female hormone production. This is done through a long, lighted tube (laparoscope) placed into the pelvis through a tiny incision in the lower abdomen.  HOME CARE INSTRUCTIONS  Only take over-the-counter or prescription medicine as directed by your health care provider.  Pay attention to the foods you eat and your activity levels. This can help reduce the effects of PCOS.  Keep your weight under control.  Eat foods that are low in carbohydrate and high in fiber.  Exercise regularly. SEEK MEDICAL CARE IF:  Your symptoms do not get  better with medicine.  You have new symptoms. Document Released: 06/29/2004 Document Revised: 12/24/2012 Document Reviewed: 08/21/2012 [facility] Patient Information 2015 [place], [place]. This information is not intended to replace advice given to you by your health care provider. Make sure you discuss any questions you have with your health care provider.

## 2014-05-24 NOTE — ED Notes (Signed)
Patient c/o abnormal bleeding onset last night. She reports she thought she was pregnant because she missed a period in February. Patient states she has been passing heavy clots and bleeding heavily all day. Patient is in NAD.

## 2014-05-24 NOTE — Discharge Instructions (Signed)
Miscarriage A miscarriage is the sudden loss of an unborn baby (fetus) before the 20th week of pregnancy. Most miscarriages happen in the first 3 months of pregnancy. Sometimes, it happens before a woman even knows she is pregnant. A miscarriage is also called a "spontaneous miscarriage" or "early pregnancy loss." Having a miscarriage can be an emotional experience. Talk with your caregiver about any questions you may have about miscarrying, the grieving process, and your future pregnancy plans. CAUSES   Problems with the fetal chromosomes that make it impossible for the baby to develop normally. Problems with the baby's genes or chromosomes are most often the result of errors that occur, by chance, as the embryo divides and grows. The problems are not inherited from the parents.  Infection of the cervix or uterus.   Hormone problems.   Problems with the cervix, such as having an incompetent cervix. This is when the tissue in the cervix is not strong enough to hold the pregnancy.   Problems with the uterus, such as an abnormally shaped uterus, uterine fibroids, or congenital abnormalities.   Certain medical conditions.   Smoking, drinking alcohol, or taking illegal drugs.   Trauma.  Often, the cause of a miscarriage is unknown.  SYMPTOMS   Vaginal bleeding or spotting, with or without cramps or pain.  Pain or cramping in the abdomen or lower back.  Passing fluid, tissue, or blood clots from the vagina. DIAGNOSIS  Your caregiver will perform a physical exam. You may also have an ultrasound to confirm the miscarriage. Blood or urine tests may also be ordered. TREATMENT   Sometimes, treatment is not necessary if you naturally pass all the fetal tissue that was in the uterus. If some of the fetus or placenta remains in the body (incomplete miscarriage), tissue left behind may become infected and must be removed. Usually, a dilation and curettage (D and C) procedure is performed.  During a D and C procedure, the cervix is widened (dilated) and any remaining fetal or placental tissue is gently removed from the uterus.  Antibiotic medicines are prescribed if there is an infection. Other medicines may be given to reduce the size of the uterus (contract) if there is a lot of bleeding.  If you have Rh negative blood and your baby was Rh positive, you will need a Rh immunoglobulin shot. This shot will protect any future baby from having Rh blood problems in future pregnancies. HOME CARE INSTRUCTIONS   Your caregiver may order bed rest or may allow you to continue light activity. Resume activity as directed by your caregiver.  Have someone help with home and family responsibilities during this time.   Keep track of the number of sanitary pads you use each day and how soaked (saturated) they are. Write down this information.   Do not use tampons. Do not douche or have sexual intercourse until approved by your caregiver.   Only take over-the-counter or prescription medicines for pain or discomfort as directed by your caregiver.   Do not take aspirin. Aspirin can cause bleeding.   Keep all follow-up appointments with your caregiver.   If you or your partner have problems with grieving, talk to your caregiver or seek counseling to help cope with the pregnancy loss. Allow enough time to grieve before trying to get pregnant again.  SEEK IMMEDIATE MEDICAL CARE IF:   You have severe cramps or pain in your back or abdomen.  You have a fever.  You pass large blood clots (walnut-sized   or larger) ortissue from your vagina. Save any tissue for your caregiver to inspect.   Your bleeding increases.   You have a thick, bad-smelling vaginal discharge.  You become lightheaded, weak, or you faint.   You have chills.  MAKE SURE YOU:  Understand these instructions.  Will watch your condition.  Will get help right away if you are not doing well or get  worse. Document Released: 08/29/2000 Document Revised: 06/30/2012 Document Reviewed: 04/24/2011 [employer] Patient Information 2015 [employer]. This information is not intended to replace advice given to you by your health care provider. Make sure you discuss any questions you have with your health care provider.  

## 2014-05-25 LAB — CERVICOVAGINAL ANCILLARY ONLY
Chlamydia: NEGATIVE
NEISSERIA GONORRHEA: NEGATIVE
WET PREP (BD AFFIRM): NEGATIVE
Wet Prep (BD Affirm): NEGATIVE
Wet Prep (BD Affirm): NEGATIVE

## 2014-05-26 NOTE — ED Notes (Signed)
Final reports of lab reviewed . All negative, no further action required

## 2014-07-09 ENCOUNTER — Ambulatory Visit
Admission: RE | Admit: 2014-07-09 | Discharge: 2014-07-09 | Disposition: A | Discharge status: Home / Self Care | Payer: 59 | Source: Ambulatory Visit | Attending: Family Medicine | Admitting: Family Medicine

## 2014-07-09 ENCOUNTER — Other Ambulatory Visit: Payer: Self-pay | Admitting: Family Medicine

## 2014-07-09 ENCOUNTER — Encounter: Payer: Self-pay | Admitting: Family Medicine

## 2014-07-09 ENCOUNTER — Ambulatory Visit (INDEPENDENT_AMBULATORY_CARE_PROVIDER_SITE_OTHER): Payer: 59 | Admitting: Family Medicine

## 2014-07-09 DIAGNOSIS — N6001 Solitary cyst of right breast: Secondary | ICD-10-CM | POA: Diagnosis not present

## 2014-07-09 DIAGNOSIS — M545 Low back pain, unspecified: Secondary | ICD-10-CM

## 2014-07-09 DIAGNOSIS — M25559 Pain in unspecified hip: Secondary | ICD-10-CM | POA: Diagnosis not present

## 2014-07-09 DIAGNOSIS — E282 Polycystic ovarian syndrome: Secondary | ICD-10-CM | POA: Diagnosis not present

## 2014-07-09 DIAGNOSIS — B353 Tinea pedis: Secondary | ICD-10-CM | POA: Diagnosis not present

## 2014-07-09 DIAGNOSIS — M25552 Pain in left hip: Principal | ICD-10-CM

## 2014-07-09 DIAGNOSIS — M25551 Pain in right hip: Secondary | ICD-10-CM

## 2014-07-09 DIAGNOSIS — L02439 Carbuncle of limb, unspecified: Secondary | ICD-10-CM | POA: Diagnosis not present

## 2014-07-09 DIAGNOSIS — L02429 Furuncle of limb, unspecified: Secondary | ICD-10-CM

## 2014-07-09 DIAGNOSIS — R7302 Impaired glucose tolerance (oral): Secondary | ICD-10-CM | POA: Diagnosis not present

## 2014-07-09 LAB — CBC WITH DIFFERENTIAL/PLATELET
Basophils Absolute: 0 10*3/uL (ref 0.0–0.1)
Basophils Relative: 0 % (ref 0–1)
Eosinophils Absolute: 0.2 10*3/uL (ref 0.0–0.7)
Eosinophils Relative: 3 % (ref 0–5)
HCT: 36.5 % (ref 36.0–46.0)
HEMOGLOBIN: 12.4 g/dL (ref 12.0–15.0)
LYMPHS PCT: 44 % (ref 12–46)
Lymphs Abs: 2.9 10*3/uL (ref 0.7–4.0)
MCH: 27.6 pg (ref 26.0–34.0)
MCHC: 34 g/dL (ref 30.0–36.0)
MCV: 81.3 fL (ref 78.0–100.0)
MPV: 9.1 fL (ref 8.6–12.4)
Monocytes Absolute: 0.3 10*3/uL (ref 0.1–1.0)
Monocytes Relative: 5 % (ref 3–12)
NEUTROS ABS: 3.1 10*3/uL (ref 1.7–7.7)
Neutrophils Relative %: 48 % (ref 43–77)
PLATELETS: 334 10*3/uL (ref 150–400)
RBC: 4.49 MIL/uL (ref 3.87–5.11)
RDW: 13.2 % (ref 11.5–15.5)
WBC: 6.5 10*3/uL (ref 4.0–10.5)

## 2014-07-09 LAB — COMPREHENSIVE METABOLIC PANEL
ALK PHOS: 48 U/L (ref 39–117)
ALT: 15 U/L (ref 0–35)
AST: 16 U/L (ref 0–37)
Albumin: 3.6 g/dL (ref 3.5–5.2)
BUN: 10 mg/dL (ref 6–23)
CO2: 23 meq/L (ref 19–32)
CREATININE: 0.66 mg/dL (ref 0.50–1.10)
Calcium: 9.1 mg/dL (ref 8.4–10.5)
Chloride: 103 mEq/L (ref 96–112)
Glucose, Bld: 88 mg/dL (ref 70–99)
POTASSIUM: 4 meq/L (ref 3.5–5.3)
SODIUM: 136 meq/L (ref 135–145)
Total Bilirubin: 0.3 mg/dL (ref 0.2–1.2)
Total Protein: 7.5 g/dL (ref 6.0–8.3)

## 2014-07-09 LAB — HEMOGLOBIN A1C
Hgb A1c MFr Bld: 6.3 % — ABNORMAL HIGH (ref <5.7)
Mean Plasma Glucose: 134 mg/dL — ABNORMAL HIGH (ref <117)

## 2014-07-09 MED ORDER — SULFAMETHOXAZOLE-TRIMETHOPRIM 800-160 MG PO TABS: 1.0000 | ORAL_TABLET | Freq: Two times a day (BID) | ORAL | Status: DC

## 2014-07-09 MED ORDER — TERBINAFINE HCL 250 MG PO TABS
250.0000 mg | ORAL_TABLET | Freq: Every day | ORAL | Status: DC
Start: 2014-07-09 — End: 2014-10-13

## 2014-07-09 NOTE — Patient Instructions (Addendum)
Take the antibiotics as prescribed for your knots Call if the one on your chest does not go down and ultrasound will be done If the one beneath axilla does not go down you will need to have it drained We will call with lab results GO get the xray of your Hips/pelvis done and lumbar spine For fungus take the terbinafine for fungus F/U 3 months

## 2014-07-11 DIAGNOSIS — B353 Tinea pedis: Secondary | ICD-10-CM | POA: Insufficient documentation

## 2014-07-11 DIAGNOSIS — L02429 Furuncle of limb, unspecified: Secondary | ICD-10-CM | POA: Insufficient documentation

## 2014-07-11 DIAGNOSIS — N6009 Solitary cyst of unspecified breast: Secondary | ICD-10-CM | POA: Insufficient documentation

## 2014-07-11 NOTE — Assessment & Plan Note (Signed)
Recheck A1C and plan to restart MTF

## 2014-07-11 NOTE — Assessment & Plan Note (Signed)
Treat with lamsil po x 2 weeks

## 2014-07-11 NOTE — Assessment & Plan Note (Signed)
Based on apperance possible sebaceous cyst or infection, as on antibiotic for axilla newly diagnosed will see if she responeds to antibiotics, if not I will obtain xr [place] of breast

## 2014-07-11 NOTE — Progress Notes (Signed)
Patient ID: Maria Maddox, female   DOB: Jul 17, 1985, 29 y.o.   MRN: [id number]   Subjective:    Patient ID: Maria Maddox, female    DOB: [birth year], 29 y.o.   MRN: [id number]  Patient presents for Lump to Breast; Knot to Under Arm; Hip Pain; and Foot Irritation   Pt here with multiple complaints:  Knot beneath her right axilla for months, states it gets smaller than bigger, no drainage, non tender, she does get boils   Knot on right breast noticed this week, NT, no drainage  Rash on left foot that came up after getting a pedicure, itchy, has used OTC anti-fungal with mild improvement  Persistant hip and back pain, takes naprosyn helps some but still gets pain when walking, no radiating pain, no injury, did not get xrays I ordered last year  PCOS- glucose intolerance- she stopped taking metformin, took on and off  Review Of Systems:  GEN- denies fatigue, fever, weight loss,weakness, recent illness HEENT- denies eye drainage, change in vision, nasal discharge, CVS- denies chest pain, palpitations RESP- denies SOB, cough, wheeze ABD- denies N/V, change in stools, abd pain GU- denies dysuria, hematuria, dribbling, incontinence MSK-+joint pain, muscle aches, injury Neuro- denies headache, dizziness, syncope, seizure activity       Objective:    BP 128/74 mmHg  Pulse 78  Temp(Src) 98.6 F (37 C) (Oral)  Resp 14  Ht 5\' 7"  (1.702 m)  Wt 272 lb (123.378 kg)  BMI 42.59 kg/m2  LMP 06/14/2014 (Approximate) GEN- NAD, alert and oriented x3,obese HEENT- PERRL, EOMI, non injected sclera, pink conjunctiva, MMM, oropharynx clear Neck- supple, no thyromegaly Breast- normal symmetry, no nipple inversion,no nipple drainage, Right lower breast around 7 o'clock, subcutaneous indurated lesion, pit in center,NT, non fluctance Nodes- no axillary nodes Skin- right axilla 2.5cm indurated area, no fluctance, NT,no erythema, left foot scaley skin heel, lateral aspect, few maculopapular lesions,  maceration between toes CVS- RRR, no murmur RESP-CTAB ABD-NABS,soft,NT,ND MSK- Fair ROM HIPS ? Habitus, normal ROM spine, spine NT,neg SLR EXT- No edema Pulses- Radial, DP- 2+        Assessment & Plan:      Problem List Items Addressed This Visit    PCOS (polycystic ovarian syndrome)   Morbid obesity - Primary   Glucose intolerance (impaired glucose tolerance)   Relevant Orders   CBC with Differential/Platelet (Completed)   Comprehensive metabolic panel (Completed)   Hemoglobin A1c (Completed)    Other Visit Diagnoses    Hip pain, unspecified laterality        Bilateral low back pain without sciatica        I think this mostly due to her habitus, both back and hip pain, will obtain xrays    Relevant Orders    DG Lumbar Spine Complete (Completed)       Note: This dictation was prepared with Dragon dictation along with smaller phrase technology. Any transcriptional errors that result from this process are unintentional.

## 2014-07-11 NOTE — Assessment & Plan Note (Signed)
Will treat with antibiotics, possible this is more of a hidradenitis, if this does not improve would refer to general surgery for drainage

## 2014-07-11 NOTE — Assessment & Plan Note (Signed)
I think weight loss is key to many of her medical problems, discussed need for diet and weight loss Weight down 5lbs over past year

## 2014-07-16 ENCOUNTER — Encounter: Payer: Self-pay | Admitting: *Deleted

## 2014-07-16 ENCOUNTER — Other Ambulatory Visit: Payer: Self-pay | Admitting: *Deleted

## 2014-07-16 MED ORDER — METFORMIN HCL 500 MG PO TABS: 500.0000 mg | ORAL_TABLET | Freq: Every day | ORAL | Status: DC

## 2014-10-13 ENCOUNTER — Encounter: Payer: Self-pay | Admitting: Family Medicine

## 2014-10-13 ENCOUNTER — Ambulatory Visit (INDEPENDENT_AMBULATORY_CARE_PROVIDER_SITE_OTHER): Payer: 59 | Admitting: Family Medicine

## 2014-10-13 VITALS — BP 122/74 | HR 72 | Temp 98.8°F | Resp 16

## 2014-10-13 DIAGNOSIS — J0100 Acute maxillary sinusitis, unspecified: Secondary | ICD-10-CM | POA: Diagnosis not present

## 2014-10-13 MED ORDER — FLUCONAZOLE 150 MG PO TABS: 150.0000 mg | ORAL_TABLET | Freq: Once | ORAL | Status: DC

## 2014-10-13 MED ORDER — AMOXICILLIN-POT CLAVULANATE 875-125 MG PO TABS
1.0000 | ORAL_TABLET | Freq: Two times a day (BID) | ORAL | Status: DC
Start: 2014-10-13 — End: 2014-12-10

## 2014-10-13 NOTE — Progress Notes (Signed)
Patient ID: Maria Maddox, female   DOB: [birth year], 29 y.o.   MRN: [id number]   Subjective:    Patient ID: Maria Maddox, female    DOB: 10-07-1985, 29 y.o.   MRN: [id number]  Patient presents for Illness  patient was sinus pressure > 1 week  initially had significant drainage sore throat for the first week. Positive sick contacts with her patient's. Now feels more congested and has severe pain bilateral sides of her face and as well as headache accompanying. She denies any significant cough. She thinks she may have had a low-grade fever yesterday. She has been taking a lot of over-the-counter medications including Claritinzyrtec Flonase but now her nose is very irritated from using nasal sprays and when she blows she has some blood streak mucous    Review Of Systems:  GEN- denies fatigue, fever, weight loss,weakness, recent illness HEENT- denies eye drainage, change in vision, +nasal discharge, CVS- denies chest pain, palpitations RESP- denies SOB, cough, wheeze ABD- denies N/V, change in stools, abd pain GU- denies dysuria, hematuria, dribbling, incontinence MSK- denies joint pain, muscle aches, injury Neuro- + headache, denies  dizziness, syncope, seizure activity       Objective:    BP 122/74 mmHg  Pulse 72  Temp(Src) 98.8 F (37.1 C) (Oral)  Resp 16  LMP 08/23/2014 (Approximate) GEN- NAD, alert and oriented x3 HEENT- Left pupil reactive , EOMI, non injected sclera, pink conjunctiva, MMM, oropharynx clear,+ maxillary and frontal sinus tenderness, +thick rhinorrhea, irritation of nasal mucosa and edema, TM clear, canals clear  Neck- Supple, nO LAD CVS- RRR, no murmur RESP-CTAB Pulses- Radial, - 2+        Assessment & Plan:      Problem List Items Addressed This Visit    None    Visit Diagnoses    Acute maxillary sinusitis, recurrence not specified    -  Primary    augmentin, d/c nasal sprays, continue claritin, mucinex Diflucan, due to yeast infection with  antibodies     Relevant Medications    amoxicillin-clavulanate (AUGMENTIN) 875-125 MG per tablet    fluconazole (DIFLUCAN) 150 MG tablet       Note: This dictation was prepared with Dragon dictation along with smaller phrase technology. Any transcriptional errors that result from this process are unintentional.

## 2014-10-13 NOTE — Patient Instructions (Signed)
Take augmentin twice a day  Mucinex  Claritin  Hold the nasal sprays F/U as previous

## 2014-12-10 ENCOUNTER — Other Ambulatory Visit (HOSPITAL_COMMUNITY)
Admission: RE | Admit: 2014-12-10 | Discharge: 2014-12-10 | Disposition: A | Discharge status: Home / Self Care | Payer: 59 | Source: Ambulatory Visit | Attending: Family Medicine | Admitting: Family Medicine

## 2014-12-10 ENCOUNTER — Emergency Department (HOSPITAL_COMMUNITY)
Admission: EM | Admit: 2014-12-10 | Discharge: 2014-12-10 | Disposition: A | Discharge status: Home / Self Care | Payer: 59 | Source: Home / Self Care | Attending: Family Medicine | Admitting: Family Medicine

## 2014-12-10 ENCOUNTER — Encounter (HOSPITAL_COMMUNITY): Payer: Self-pay | Admitting: Emergency Medicine

## 2014-12-10 DIAGNOSIS — B373 Candidiasis of vulva and vagina: Secondary | ICD-10-CM | POA: Diagnosis not present

## 2014-12-10 DIAGNOSIS — B3731 Acute candidiasis of vulva and vagina: Secondary | ICD-10-CM

## 2014-12-10 DIAGNOSIS — Z760 Encounter for issue of repeat prescription: Secondary | ICD-10-CM | POA: Diagnosis present

## 2014-12-10 LAB — POCT URINALYSIS DIP (DEVICE)
Bilirubin Urine: NEGATIVE
Glucose, UA: NEGATIVE mg/dL
Hgb urine dipstick: NEGATIVE
KETONES UR: NEGATIVE mg/dL
Nitrite: NEGATIVE
PROTEIN: NEGATIVE mg/dL
Urobilinogen, UA: 0.2 mg/dL (ref 0.0–1.0)
pH: 6 (ref 5.0–8.0)

## 2014-12-10 MED ORDER — FLUCONAZOLE 150 MG PO TABS: ORAL_TABLET | ORAL | Status: DC

## 2014-12-10 NOTE — ED Notes (Signed)
Reports vaginal irritation yesterday.  Patient used otc monistat and reports extreme burning.  Prior to this did not note any abnormal vagina discharge.  After monistat, noted burning with discharge

## 2014-12-10 NOTE — ED Provider Notes (Signed)
CSN: [id number]     Arrival date & time 12/10/14  1303 History   First MD Initiated Contact with Patient 12/10/14 1316     Chief Complaint  Patient presents with  . Vaginal Discharge   (Consider location/radiation/quality/duration/timing/severity/associated sxs/prior Treatment) HPI Comments: 29 year old morbidly obese female complaining of an irritation to the vulvovaginal tissues. She denies discharge. It was a minor irritation yesterday and she decided to treat herself with Monistat suppositories. A few hours after applying this treatment she developed severe burning feeling like that this area was on fire. She denies any discharge. Head denied urine symptoms prior to yesterday but after having the burning she states that with  urination she has dysuria described as pain at the meatus. No urinary frequency. She also has a history of PCO S    Past Medical History  Diagnosis Date  . Glaucoma     has had for 10 yrears  . Eczema   . Legally blind in right eye, as defined in [place]   . PCOS (polycystic ovarian syndrome)    Past Surgical History  Procedure Laterality Date  . Eye surgery      right side, numerous surgeries (now blind)   Family History  Problem Relation Age of Onset  . Arthritis Mother   . Depression Mother   . Diabetes Mother   . Hyperlipidemia Mother   . Arthritis Father   . Cancer Maternal Grandmother     breast  . Diabetes Paternal Grandmother   . Diabetes Paternal Grandfather   . Hearing loss Paternal Grandfather   . Cancer Maternal Aunt 76    Breast Cancer   Social History  Substance Use Topics  . Smoking status: Never Smoker   . Smokeless tobacco: Never Used  . Alcohol Use: No   OB History    No data available     Review of Systems  Constitutional: Negative.   Respiratory: Negative.   Genitourinary: Positive for vaginal pain. Negative for urgency, frequency, flank pain, decreased urine volume, vaginal discharge and pelvic pain.  Skin: Negative.    Neurological: Negative.   All other systems reviewed and are negative.   Allergies  Review of patient's allergies indicates no known allergies.  Home Medications   Prior to Admission medications   Medication Sig Start Date End Date Taking? Authorizing Kato Wieczorek  brimonidine (ALPHAGAN P) 0.1 % SOLN Place 1 drop into both eyes 2 (two) times daily.    Historical Hae Ahlers, MD  dorzolamide-timolol (COSOPT) 22.3-6.8 MG/ML ophthalmic solution Place 1 drop into the right eye daily.    Historical Diago Haik, MD  fluconazole (DIFLUCAN) 150 MG tablet 1 tab po x 1. May repeat in 72 hours if no improvement 12/10/14   Janne Napoleon, NP  fluticasone [facility]) 50 MCG/ACT nasal spray Place 2 sprays into both nostrils daily.    Historical Miriya Cloer, MD  metFORMIN (GLUCOPHAGE) 500 MG tablet Take 1 tablet (500 mg total) by mouth daily with breakfast. 07/16/14   Alycia Rossetti, MD   Meds Ordered and Administered this Visit  Medications - No data to display  BP 127/75 mmHg  Pulse 87  Temp(Src) 98.8 F (37.1 C) (Oral)  Resp 16  SpO2 99%  LMP 11/26/2014 No data found.   Physical Exam  Constitutional: She is oriented to person, place, and time. She appears well-developed and well-nourished. No distress.  Eyes: EOM are normal.  Neck: Normal range of motion. Neck supple.  Cardiovascular: Normal rate.   Pulmonary/Chest: Effort normal. No respiratory distress.  Genitourinary:  [place] RN present. Normal anatomical external female genitalia The introitus and labia minora are erythematous. There is a sticky white substance located within the introitus. No vaginal flow of blood or other discharge. Minor tenderness to palpation.  Musculoskeletal: She exhibits no edema.  Neurological: She is alert and oriented to person, place, and time. She exhibits normal muscle tone.  Skin: Skin is warm and dry.  Psychiatric: She has a normal mood and affect.  Nursing note and vitals reviewed.   ED Course   Procedures (including critical care time)  Labs Review Labs Reviewed  CERVICOVAGINAL ANCILLARY ONLY    Imaging Review No results found.   Visual Acuity Review  Right Eye Distance:   Left Eye Distance:   Bilateral Distance:    Right Eye Near:   Left Eye Near:    Bilateral Near:         MDM   1. Vulvovaginitis due to Candida    Diflucan as dir     Janne Napoleon, NP 12/10/14 1347

## 2014-12-10 NOTE — Discharge Instructions (Signed)

## 2014-12-13 LAB — CERVICOVAGINAL ANCILLARY ONLY: WET PREP (BD AFFIRM): NEGATIVE

## 2014-12-13 NOTE — ED Notes (Signed)
Final report of wet prep negative. Still waiting for GC chlamydia report

## 2014-12-16 NOTE — ED Notes (Signed)
Review of record shows no STD swab collected, only wet prep

## 2015-03-09 ENCOUNTER — Encounter: Payer: Self-pay | Admitting: Family Medicine

## 2015-03-09 ENCOUNTER — Ambulatory Visit (INDEPENDENT_AMBULATORY_CARE_PROVIDER_SITE_OTHER): Payer: 59 | Admitting: Family Medicine

## 2015-03-09 VITALS — BP 128/74 | HR 82 | Temp 98.7°F | Resp 16 | Ht 67.0 in | Wt 287.0 lb

## 2015-03-09 DIAGNOSIS — Z0000 Encounter for general adult medical examination without abnormal findings: Secondary | ICD-10-CM | POA: Diagnosis not present

## 2015-03-09 NOTE — Patient Instructions (Addendum)
Release of records- [facility]  F/U as needed

## 2015-03-09 NOTE — Progress Notes (Signed)
Patient ID: Maria Maddox, female   DOB: 06-10-1985, 29 y.o.   MRN: [id number]   Subjective:    Patient ID: Maria Maddox, female    DOB: 06/20/1985, 29 y.o.   MRN: [id number]  Patient presents for CPE  Pt here for CPE, no specific concerns. For past few months has been working her her GYN/Fertility specialist trying to get pregnant after multiple rounds of medications and injections she did not have luck and does not have finances for IVF. She and husband have decided to [facility] to [employer]. She has CPE form from DSS here today.   Immunizations UTD  No risk factors for TB   Current meds for Glaucoma only     Review Of Systems:  GEN- denies fatigue, fever, weight loss,weakness, recent illness HEENT- denies eye drainage, change in vision, nasal discharge, CVS- denies chest pain, palpitations RESP- denies SOB, cough, wheeze ABD- denies N/V, change in stools, abd pain GU- denies dysuria, hematuria, dribbling, incontinence MSK- denies joint pain, muscle aches, injury Neuro- denies headache, dizziness, syncope, seizure activity       Objective:    BP 128/74 mmHg  Pulse 82  Temp(Src) 98.7 F (37.1 C) (Oral)  Resp 16  Ht 5\' 7"  (1.702 m)  Wt 287 lb (130.182 kg)  BMI 44.94 kg/m2  LMP  (Approximate) GEN- NAD, alert and oriented x3,Obese  HEENT- PERRL, EOMI, non injected sclera, pink conjunctiva, MMM, oropharynx clear Neck- Supple, no thyromegaly CVS- RRR, no murmur RESP-CTAB ABD-NABS,soft,NT,ND Psych- normal affect and mood MSK-FROM upper and lower ext EXT- No edema Pulses- Radial - 2+        Assessment & Plan:      Problem List Items Addressed This Visit    None    Visit Diagnoses    Routine general medical examination at a health care facility    -  Primary    CPE done, Form completed for DSS, no restrictions on foster parenting. Immunizations UTD, obtain records from GYN for labs and PAP        Note: This dictation was prepared with Dragon dictation along with  smaller phrase technology. Any transcriptional errors that result from this process are unintentional.

## 2015-04-26 ENCOUNTER — Encounter: Payer: Self-pay | Admitting: Family Medicine

## 2015-04-26 ENCOUNTER — Ambulatory Visit (INDEPENDENT_AMBULATORY_CARE_PROVIDER_SITE_OTHER): Payer: 59 | Admitting: Family Medicine

## 2015-04-26 VITALS — BP 136/78 | HR 82 | Temp 98.5°F | Resp 16 | Ht 67.0 in | Wt 280.0 lb

## 2015-04-26 DIAGNOSIS — D489 Neoplasm of uncertain behavior, unspecified: Secondary | ICD-10-CM

## 2015-04-26 DIAGNOSIS — E739 Lactose intolerance, unspecified: Secondary | ICD-10-CM | POA: Diagnosis not present

## 2015-04-26 DIAGNOSIS — L919 Hypertrophic disorder of the skin, unspecified: Secondary | ICD-10-CM | POA: Diagnosis not present

## 2015-04-26 NOTE — Progress Notes (Signed)
Patient ID: Maria Maddox, female   DOB: 1985/09/26, 30 y.o.   MRN: [id number]   Subjective:    Patient ID: Maria Maddox, female    DOB: 28-Jun-1985, 30 y.o.   MRN: [id number]  Patient presents for Skin Tag to R Side of Neck  patient here with a skin tag to the right side of her neck. It has been growing in size over the past couple months. She also noticed some mild drainage out of it over the weekend. He gets caught on her clothing and her jewelry. She did have one skin tag removed off her nose in the past but this was benign.   she also notes that when she eats a lot of dairy products that she gets mild diarrhea after meals. She eats other foods she does not have any problems. She was told by her mother that she had issues with their products when she was younger. She denies any blood in the stool.  Review Of Systems:  GEN- denies fatigue, fever, weight loss,weakness, recent illness HEENT- denies eye drainage, change in vision, nasal discharge, CVS- denies chest pain, palpitations RESP- denies SOB, cough, wheeze ABD- denies N/V, +change in stools, abd pain Neuro- denies headache, dizziness, syncope, seizure activity       Objective:    BP 136/78 mmHg  Pulse 82  Temp(Src) 98.5 F (36.9 C) (Oral)  Resp 16  Ht 5\' 7"  (1.702 m)  Wt 280 lb (127.007 kg)  BMI 43.84 kg/m2 GEN- NAD, alert and oriented x3 Skin- Right neck- skin tag with erythematous bulbous lesion on end, clear drainage with palpation, no erythema at base of tag, no induration  CVS- RRR, no murmur RESP-CTAB ABD-NABS,soft,NT,ND  Procedure- Skin Tag Removal Procedure explained to patient questions answered benefits and risks discussed verbal consent obtained. Antiseptic-Betadine Anesthesia-lidocaine 1% with Epi  Tags clipped at base with scissors Minimal blood loss, Drysol touched to lesion on neck and axilla due to persistent oozing Patient tolerated procedure well Bandage applied Send for pathology          Assessment & Plan:      Problem List Items Addressed This Visit    None    Visit Diagnoses    Neoplasm of uncertain behavior    -  Primary    Appears to be tag at base but lesion on end with drainage concerning, send for pathology, no sign of infection after completey removed    Relevant Orders    Pathology Report    Lactose intolerance in adult        avoid dairy products, can try probiotics as well       Note: This dictation was prepared with Dragon dictation along with smaller phrase technology. Any transcriptional errors that result from this process are unintentional.

## 2015-04-28 LAB — PATHOLOGY

## 2015-08-22 ENCOUNTER — Encounter: Payer: Self-pay | Admitting: Family Medicine

## 2015-08-22 ENCOUNTER — Ambulatory Visit (INDEPENDENT_AMBULATORY_CARE_PROVIDER_SITE_OTHER): Payer: 59 | Admitting: Family Medicine

## 2015-08-22 VITALS — BP 128/74 | HR 82 | Temp 98.6°F | Resp 14 | Ht 66.0 in | Wt 285.0 lb

## 2015-08-22 DIAGNOSIS — L0501 Pilonidal cyst with abscess: Secondary | ICD-10-CM

## 2015-08-22 MED ORDER — HYDROCODONE-ACETAMINOPHEN 5-325 MG PO TABS: 1.0000 | ORAL_TABLET | Freq: Four times a day (QID) | ORAL | Status: DC | PRN

## 2015-08-22 MED ORDER — SULFAMETHOXAZOLE-TRIMETHOPRIM 800-160 MG PO TABS: 1.0000 | ORAL_TABLET | Freq: Two times a day (BID) | ORAL | Status: DC

## 2015-08-22 MED FILL — SULFAMETHOXAZOLE/TMP DS TAB: 800-160 | 7 days supply | Qty: 14 | Fill #0

## 2015-08-22 MED FILL — HYDROCODON-APAP 5-325: 5-325 | 3 days supply | Qty: 20 | Fill #0

## 2015-08-22 NOTE — Progress Notes (Signed)
Patient ID: Maria Maddox, female   DOB: Oct 24, 1985, 30 y.o.   MRN: [id number]   Subjective:    Patient ID: Maria Maddox, female    DOB: 09-03-1985, 30 y.o.   MRN: [id number]  Patient presents for Abscess Patient here with abscess of her right buttocks. She states she's had this recurrent sense childhood that she can remember. The last time she had it was opened in the emergency room. Responds easily came up last Wednesday she's tried some warm soaks but has pain with sitting down. She has not had any fever no new joint pain no chills. She's not had any drainage from the lesion.    Review Of Systems:  GEN- denies fatigue, fever, weight loss,weakness, recent illness HEENT- denies eye drainage, change in vision, nasal discharge, CVS- denies chest pain, palpitations RESP- denies SOB, cough, wheeze ABD- denies N/V, change in stools, abd pain GU- denies dysuria, hematuria, dribbling, incontinence MSK- denies joint pain, muscle aches, injury Neuro- denies headache, dizziness, syncope, seizure activity       Objective:    BP 128/74 mmHg  Pulse 82  Temp(Src) 98.6 F (37 C)  Resp 14  Ht 5\' 6"  (1.676 m)  Wt 285 lb (129.275 kg)  BMI 46.02 kg/m2 GEN- NAD, alert and oriented x3 Skin- gluteal cleft- 2 small pits seen, quarter size abscess with smal pustules at center on inner right clert, TTP, no erythema surrounding   Procedure- Incision and Drainage Procedure explained to patient questions answered benefits and risks discussed written consent obtained. Antiseptic-Betadine Anesthesia-lidocaine 1% Incision performed small amount of pus expressed Culture taken Minimal blood loss Patient tolerated procedure well Bandage applied      Assessment & Plan:      Problem List Items Addressed This Visit    None    Visit Diagnoses    Pilonidal cyst with abscess    -  Primary    Recurrent infected cyst, not much pus expressed today. Soaks, Bactrim, refer to general surgery while  inflammed, discuss surgical options due to recurrence. Norco given for pain    Relevant Orders    Ambulatory referral to General Surgery    WOUND CULTURE ([place])       Note: This dictation was prepared with Dragon dictation along with smaller phrase technology. Any transcriptional errors that result from this process are unintentional.

## 2015-08-22 NOTE — Patient Instructions (Signed)
Referral to general surgery for evaluation Take antibiotics  Take pain medication F/U as needed

## 2015-08-24 ENCOUNTER — Telehealth: Payer: Self-pay | Admitting: Family Medicine

## 2015-08-24 DIAGNOSIS — L0501 Pilonidal cyst with abscess: Secondary | ICD-10-CM | POA: Diagnosis not present

## 2015-08-24 MED ORDER — FLUCONAZOLE 150 MG PO TABS: 150.0000 mg | ORAL_TABLET | Freq: Once | ORAL | Status: DC

## 2015-08-24 MED FILL — FLUCONAZOLE 150 MG TABLET: 150 | 1 days supply | Qty: 1 | Fill #0

## 2015-08-24 NOTE — Telephone Encounter (Signed)
Prescription sent to pharmacy for Diflucan. Advised that if S/Sx do not resolve after dosage, OV will be required.  

## 2015-08-24 NOTE — Telephone Encounter (Signed)
Patient has yeast infection from antibiotic that she was prescribed, would ike to know if something can be called in for her  [place]

## 2015-08-25 LAB — WOUND CULTURE
GRAM STAIN: NONE SEEN
Gram Stain: NONE SEEN
ORGANISM ID, BACTERIA: NORMAL

## 2015-09-05 ENCOUNTER — Other Ambulatory Visit: Payer: Self-pay | Admitting: Surgery

## 2015-09-05 DIAGNOSIS — L0501 Pilonidal cyst with abscess: Secondary | ICD-10-CM | POA: Diagnosis not present

## 2015-09-14 ENCOUNTER — Ambulatory Visit (INDEPENDENT_AMBULATORY_CARE_PROVIDER_SITE_OTHER): Payer: 59 | Admitting: Family Medicine

## 2015-09-14 ENCOUNTER — Encounter: Payer: Self-pay | Admitting: Family Medicine

## 2015-09-14 VITALS — BP 130/80 | HR 76 | Temp 98.7°F | Resp 14 | Ht 65.0 in | Wt 280.0 lb

## 2015-09-14 DIAGNOSIS — N390 Urinary tract infection, site not specified: Secondary | ICD-10-CM

## 2015-09-14 DIAGNOSIS — B9689 Other specified bacterial agents as the cause of diseases classified elsewhere: Secondary | ICD-10-CM

## 2015-09-14 DIAGNOSIS — A499 Bacterial infection, unspecified: Secondary | ICD-10-CM | POA: Diagnosis not present

## 2015-09-14 DIAGNOSIS — N760 Acute vaginitis: Secondary | ICD-10-CM | POA: Diagnosis not present

## 2015-09-14 DIAGNOSIS — R300 Dysuria: Secondary | ICD-10-CM | POA: Diagnosis not present

## 2015-09-14 LAB — URINALYSIS, MICROSCOPIC ONLY
CASTS: NONE SEEN [LPF]
Crystals: NONE SEEN [HPF]
YEAST: NONE SEEN [HPF]

## 2015-09-14 LAB — URINALYSIS, ROUTINE W REFLEX MICROSCOPIC
Bilirubin Urine: NEGATIVE
GLUCOSE, UA: NEGATIVE
Ketones, ur: NEGATIVE
Nitrite: NEGATIVE
PH: 6 (ref 5.0–8.0)
Specific Gravity, Urine: 1.025 (ref 1.001–1.035)

## 2015-09-14 LAB — WET PREP FOR TRICH, YEAST, CLUE
TRICH WET PREP: NONE SEEN
Yeast Wet Prep HPF POC: NONE SEEN

## 2015-09-14 MED ORDER — CEFTRIAXONE SODIUM 1 G IJ SOLR
1.0000 g | Freq: Once | INTRAMUSCULAR | Status: AC
  Administered 2015-09-14: 1 g via INTRAMUSCULAR

## 2015-09-14 MED ORDER — FLUCONAZOLE 150 MG PO TABS: ORAL_TABLET | ORAL | Status: DC

## 2015-09-14 MED ORDER — METRONIDAZOLE 500 MG PO TABS: 500.0000 mg | ORAL_TABLET | Freq: Two times a day (BID) | ORAL | Status: DC

## 2015-09-14 MED FILL — metroNIDAZOLE 500 MG TABS: 500 | 7 days supply | Qty: 14 | Fill #0

## 2015-09-14 MED FILL — FLUCONAZOLE 150 MG TABLET: 150 | 4 days supply | Qty: 2 | Fill #0

## 2015-09-14 NOTE — Patient Instructions (Signed)
Rocephin injection given Flagyl as prescribed We will call with urine culture F/U as needed

## 2015-09-14 NOTE — Progress Notes (Signed)
Patient ID: Maria Maddox, female   DOB: September 03, 1985, 30 y.o.   MRN: [id number]   Subjective:    Patient ID: Maria Maddox, female    DOB: [birth year], 30 y.o.   MRN: [id number]  Patient presents for Vaginal Irritation  Pt her with dysuria for 5 days along with vaginal itching, discharge  treated for abscess on 6/5 with bactrim  She took diflucan about a week ago, with some improvement, but symptoms still present. No abd pain, no fever, no N/V     Review Of Systems:  GEN- denies fatigue, fever, weight loss,weakness, recent illness CVS- denies chest pain, palpitations RESP- denies SOB, cough, wheeze ABD- denies N/V, change in stools, abd pain GU- +dysuria, hematuria, dribbling, incontinence      Objective:    BP 130/80 mmHg  Pulse 76  Temp(Src) 98.7 F (37.1 C) (Oral)  Resp 14  Ht 5\' 5"  (1.651 m)  Wt 280 lb (127.007 kg)  BMI 46.59 kg/m2 GEN- NAD, alert and oriented x3 ABD-NABS,soft,NT,ND, no CVA tenderness GU- normal external genitalia, vaginal mucosa pink- irritated appearing at intrioitus and moist, cervix visualized no growth, no blood form os, +r discharge, no CMT, no ovarian masses, uterus normal size EXT- No edema Pulses- Radial 2+        Assessment & Plan:      Problem List Items Addressed This Visit    None    Visit Diagnoses    UTI (lower urinary tract infection)    -  Primary    Rocephin injection given, ensure not contaimination as she defintely has BV, recnetly on Bactrim. Culture sent treat based on results further, flagyl for BV    Relevant Medications    fluconazole (DIFLUCAN) 150 MG tablet    metroNIDAZOLE (FLAGYL) 500 MG tablet    cefTRIAXone (ROCEPHIN) injection 1 g (Completed)    Other Relevant Orders    Urinalysis, Routine w reflex microscopic (not at [facility]) (Completed)    Urine culture    BV (bacterial vaginosis)        Relevant Medications    fluconazole (DIFLUCAN) 150 MG tablet    metroNIDAZOLE (FLAGYL) 500 MG tablet    cefTRIAXone  (ROCEPHIN) injection 1 g (Completed)    Other Relevant Orders    Wet Prep for Trick, Yeast, Clue (Completed)       Note: This dictation was prepared with Dragon dictation along with smaller Company secretary. Any transcriptional errors that result from this process are unintentional.

## 2015-09-15 ENCOUNTER — Encounter: Payer: Self-pay | Admitting: Family Medicine

## 2015-09-16 LAB — URINE CULTURE

## 2015-10-19 ENCOUNTER — Encounter (HOSPITAL_BASED_OUTPATIENT_CLINIC_OR_DEPARTMENT_OTHER): Payer: Self-pay | Admitting: *Deleted

## 2015-10-19 NOTE — Progress Notes (Signed)
Bring all medications. Spoke with Dr. Jillyn Hidden about Urine pregnancy test - he said to offer to pt and will do if she would like one. Spoke with pt does not want urine pregnancy.

## 2015-10-25 NOTE — H&P (Signed)
Maria Maddox  Location: [facility] Patient #: [id number] DOB: [birth year] Married / Language: English / Race: Black or African American Female   History of Present Illness The patient is a 30 year old female who presents with a pilonidal cyst. Maria Maddox is a 30 year old patient who is referred to [place] by Dr. Buelah Manis with [facility] for a one-week history of pain to the pilonidal area. The patient gives a lifelong history of problems with recurrent pilonidal abscesses. She says most recently, she has one of these flareups about yearly. She usually has had incision and drainage as well as antibiotics but given the recurrent nature she has been referred to [place] for definitive treatment. She had an incision and drainage performed by Dr. Buelah Manis in the office on June 5. She had also been placed on antibiotics which she has not completed yet. At the present time, the patient says the area is better although she still feels some discomfort. She denies fever. She says there is a small amount of drainage.   Other Problems Other disease, cancer, significant illness  Diagnostic Studies History Colonoscopy never Mammogram never Pap Smear 1-5 years ago  Allergies No Known Drug Allergies06/09/2015  Medication History  Sulfamethoxazole (500MG  Tablet, Oral) Active. Medications Reconciled  Social History  Caffeine use Carbonated beverages, Tea. No alcohol use No drug use Tobacco use Never smoker.  Family History  Arthritis Father, Mother. Breast Cancer Family Members In General. Cerebrovascular Accident Father. Cervical Cancer Family Members In General. Depression Family Members In General, Mother. Diabetes Mellitus Family Members In General, Father, Mother. Hypertension Family Members In General, Father, Mother. Kidney Disease Family Members In General.  Pregnancy / Birth History   Age at menarche 70 years. Gravida 1 Irregular  periods Maternal age 96-30 Para 0    Review of SystemsPM) General Not Present- Appetite Loss, Chills, Fatigue, Fever, Night Sweats, Weight Gain and Weight Loss. Skin Present- Change in Wart/Mole. Not Present- Dryness, Hives, Jaundice, New Lesions, Non-Healing Wounds, Rash and Ulcer. HEENT Not Present- Earache, Hearing Loss, Hoarseness, Nose Bleed, Oral Ulcers, Ringing in the Ears, Seasonal Allergies, Sinus Pain, Sore Throat, Visual Disturbances, Wears glasses/contact lenses and Yellow Eyes. Respiratory Not Present- Bloody sputum, Chronic Cough, Difficulty Breathing, Snoring and Wheezing. Breast Not Present- Breast Mass, Breast Pain, Nipple Discharge and Skin Changes. Cardiovascular Not Present- Chest Pain, Difficulty Breathing Lying Down, Leg Cramps, Palpitations, Rapid Heart Rate, Shortness of Breath and Swelling of Extremities. Gastrointestinal Not Present- Abdominal Pain, Bloating, Bloody Stool, Change in Bowel Habits, Chronic diarrhea, Constipation, Difficulty Swallowing, Excessive gas, Gets full quickly at meals, Hemorrhoids, Indigestion, Nausea, Rectal Pain and Vomiting. Female Genitourinary Not Present- Frequency, Nocturia, Painful Urination, Pelvic Pain and Urgency. Musculoskeletal Not Present- Back Pain, Joint Pain, Joint Stiffness, Muscle Pain, Muscle Weakness and Swelling of Extremities. Neurological Not Present- Decreased Memory, Fainting, Headaches, Numbness, Seizures, Tingling, Tremor, Trouble walking and Weakness. Psychiatric Not Present- Anxiety, Bipolar, Change in Sleep Pattern, Depression, Fearful and Frequent crying. Endocrine Not Present- Cold Intolerance, Excessive Hunger, Hair Changes, Heat Intolerance, Hot flashes and New Diabetes. Hematology Not Present- Easy Bruising, Excessive bleeding, Gland problems, HIV and Persistent Infections.  Vitals   Weight: 283 lb Height: 66in Body Surface Area: 2.32 m Body Mass Index: 45.68 kg/m  Temp.: 97.22F(Temporal)   Pulse: 80 (Regular)  BP: 130/80 (Sitting, Left Arm, Standard)       Physical Exam  General Mental Status-Alert. General Appearance-Cooperative, Not in acute distress. Orientation-Oriented X4.  Integumentary General Characteristics Overall examination  of the patient's skin reveals - no rashes. Color - normal coloration of skin. Skin Moisture - normal skin moisture.  Head and Neck Head-normocephalic, atraumatic with no lesions or palpable masses.  Chest and Lung Exam Chest and lung exam reveals -quiet, even and easy respiratory effort with no use of accessory muscles.  Rectal Note: There is a approximately 4 cm diameter area of induration with a central opening in the pilonidal area consistent with a draining pilonidal abscess. I could not express any further drainage from the wound. It is mildly tender and there is no erythema. It is predominantly on the left-hand side.   Neurologic Neurologic evaluation reveals -normal attention span and ability to concentrate and able to name objects and repeat phrases. Appropriate fund of knowledge .  Musculoskeletal Global Assessment Gait and Station - normal gait and station and normal posture.    Assessment & Plan    PILONIDAL ABSCESS (L05.01)  Impression: I offered the patient incision and drainage versus continued antibiotics and conservative management. The patient would like to finish her antibiotic course and observe the area. We will refer her to Dr. Rush Farmer as she will likely need definitive management given her history of recurrences. I have also recommended that she watched the area for worsening symptoms and to contact us should they occur. She should take ibuprofen over-the-counter as needed for pain and inflammation and take sitz baths twice a day to soothe the area. Her questions were answered to her satisfaction. Current Plans Pt Education - [facility] Pilonidal Disease (AT) Instructed to keep follow-up  appointment as scheduled Maria Maddox 08/24/2015 3:23 PM Location: [facility] Patient #: [id number] DOB: 10-28-1985 Married / Language: English / Race: Black or African American Female   History of Present Illness Maria Maddox [place] PA C; 08/24/2015 3:42 PM) The patient is a 30 year old female who presents with a pilonidal cyst. Maria Maddox is a 30 year old patient who is referred to [place] by Dr. Buelah Manis with [facility] for a one-week history of pain to the pilonidal area. The patient gives a lifelong history of problems with recurrent pilonidal abscesses. She says most recently, she has one of these flareups about yearly. She usually has had incision and drainage as well as antibiotics but given the recurrent nature she has been referred to [place] for definitive treatment. She had an incision and drainage performed by Dr. Buelah Manis in the office on June 5. She had also been placed on antibiotics which she has not completed yet. At the present time, the patient says the area is better although she still feels some discomfort. She denies fever. She says there is a small amount of drainage.   Other Problems Maria Maddox, CMA; 08/24/2015 3:24 PM) Other disease, cancer, significant illness  Diagnostic Studies History Maria Maddox, CMA; 08/24/2015 3:24 PM) Colonoscopy never Mammogram never Pap Smear 1-5 years ago  Allergies Maria Maddox, CMA; 08/24/2015 3:24 PM) No Known Drug Allergies06/09/2015  Medication History Maria Maddox, CMA; 08/24/2015 3:24 PM) Sulfamethoxazole (500MG  Tablet, Oral) Active. Medications Reconciled  Social History Maria Maddox, [place]; 08/24/2015 3:24 PM) Caffeine use Carbonated beverages, Tea. No alcohol use No drug use Tobacco use Never smoker.  Family History Maria Maddox, [place]; 08/24/2015 3:24 PM) Arthritis Father, Mother. Breast Cancer Family Members In General. Cerebrovascular Accident Father. Cervical Cancer Family Members In  General. Depression Family Members In General, Mother. Diabetes Mellitus Family Members In General, Father, Mother. Hypertension Family Members In General, Father, Mother. Kidney Disease Family Members  In General.  Pregnancy / Birth History Maria Maddox, CMA; 08/24/2015 3:24 PM) Age at menarche 27 years. Gravida 1 Irregular periods Maternal age 56-30 Para 0    Review of Systems Maria Maddox CMA; 08/24/2015 3:24 PM) General Not Present- Appetite Loss, Chills, Fatigue, Fever, Night Sweats, Weight Gain and Weight Loss. Skin Present- Change in Wart/Mole. Not Present- Dryness, Hives, Jaundice, New Lesions, Non-Healing Wounds, Rash and Ulcer. HEENT Not Present- Earache, Hearing Loss, Hoarseness, Nose Bleed, Oral Ulcers, Ringing in the Ears, Seasonal Allergies, Sinus Pain, Sore Throat, Visual Disturbances, Wears glasses/contact lenses and Yellow Eyes. Respiratory Not Present- Bloody sputum, Chronic Cough, Difficulty Breathing, Snoring and Wheezing. Breast Not Present- Breast Mass, Breast Pain, Nipple Discharge and Skin Changes. Cardiovascular Not Present- Chest Pain, Difficulty Breathing Lying Down, Leg Cramps, Palpitations, Rapid Heart Rate, Shortness of Breath and Swelling of Extremities. Gastrointestinal Not Present- Abdominal Pain, Bloating, Bloody Stool, Change in Bowel Habits, Chronic diarrhea, Constipation, Difficulty Swallowing, Excessive gas, Gets full quickly at meals, Hemorrhoids, Indigestion, Nausea, Rectal Pain and Vomiting. Female Genitourinary Not Present- Frequency, Nocturia, Painful Urination, Pelvic Pain and Urgency. Musculoskeletal Not Present- Back Pain, Joint Pain, Joint Stiffness, Muscle Pain, Muscle Weakness and Swelling of Extremities. Neurological Not Present- Decreased Memory, Fainting, Headaches, Numbness, Seizures, Tingling, Tremor, Trouble walking and Weakness. Psychiatric Not Present- Anxiety, Bipolar, Change in Sleep Pattern, Depression, Fearful and Frequent  crying. Endocrine Not Present- Cold Intolerance, Excessive Hunger, Hair Changes, Heat Intolerance, Hot flashes and New Diabetes. Hematology Not Present- Easy Bruising, Excessive bleeding, Gland problems, HIV and Persistent Infections.  Vitals Maria Maddox CMA; 08/24/2015 3:25 PM) 08/24/2015 3:24 PM Weight: 283 lb Height: 66in Body Surface Area: 2.32 m Body Mass Index: 45.68 kg/m  Temp.: 97.61F(Temporal)  Pulse: 80 (Regular)  BP: 130/80 (Sitting, Left Arm, Standard)       Physical Exam (Andy Liepins PA C; 08/24/2015 3:40 PM) General Mental Status-Alert. General Appearance-Cooperative, Not in acute distress. Orientation-Oriented X4.  Integumentary General Characteristics Overall examination of the patient's skin reveals - no rashes. Color - normal coloration of skin. Skin Moisture - normal skin moisture.  Head and Neck Head-normocephalic, atraumatic with no lesions or palpable masses.  Chest and Lung Exam Chest and lung exam reveals -quiet, even and easy respiratory effort with no use of accessory muscles.  Rectal Note: There is a approximately 4 cm diameter area of induration with a central opening in the pilonidal area consistent with a draining pilonidal abscess. I could not express any further drainage from the wound. It is mildly tender and there is no erythema. It is predominantly on the left-hand side.   Neurologic Neurologic evaluation reveals -normal attention span and ability to concentrate and able to name objects and repeat phrases. Appropriate fund of knowledge .  Musculoskeletal Global Assessment Gait and Station - normal gait and station and normal posture.    Assessment & Plan   PILONIDAL ABSCESS (L05.01) Impression: I offered the patient incision and drainage versus continued antibiotics and conservative management. The patient would like to finish her antibiotic course and observe the area. We will refer her to Dr. Rush Farmer as she  will likely need definitive management given her history of recurrences. I have also recommended that she watched the area for worsening symptoms and to contact us should they occur. She should take ibuprofen over-the-counter as needed for pain and inflammation and take sitz baths twice a day to soothe the area. Her questions were answered to her satisfaction.   Addendum: PILONIDAL ABSCESS (L05.01) Impression: Pilonidal cystectomy  as recommended and she is eager to go ahead and proceed with surgery. I discussed the surgical procedure with her in detail. I discussed the risks of surgery which includes but is not limited to bleeding, infection, having a chronic open wound, recurrent, cardiopulmonary issues, etc. She understands and wishes to proceed with surgery which will be scheduled

## 2015-10-26 ENCOUNTER — Encounter (HOSPITAL_BASED_OUTPATIENT_CLINIC_OR_DEPARTMENT_OTHER)
Admission: RE | Disposition: A | Discharge status: Home / Self Care | Payer: Self-pay | Source: Ambulatory Visit | Attending: Surgery

## 2015-10-26 ENCOUNTER — Ambulatory Visit (HOSPITAL_BASED_OUTPATIENT_CLINIC_OR_DEPARTMENT_OTHER)
Admission: RE | Admit: 2015-10-26 | Discharge: 2015-10-26 | Disposition: A | Discharge status: Home / Self Care | Payer: 59 | Source: Ambulatory Visit | Attending: Surgery | Admitting: Surgery

## 2015-10-26 ENCOUNTER — Ambulatory Visit (HOSPITAL_BASED_OUTPATIENT_CLINIC_OR_DEPARTMENT_OTHER): Payer: 59 | Admitting: Anesthesiology

## 2015-10-26 ENCOUNTER — Encounter (HOSPITAL_BASED_OUTPATIENT_CLINIC_OR_DEPARTMENT_OTHER): Payer: Self-pay | Admitting: *Deleted

## 2015-10-26 DIAGNOSIS — L0501 Pilonidal cyst with abscess: Secondary | ICD-10-CM | POA: Insufficient documentation

## 2015-10-26 DIAGNOSIS — L0591 Pilonidal cyst without abscess: Secondary | ICD-10-CM | POA: Diagnosis not present

## 2015-10-26 HISTORY — PX: PILONIDAL CYST EXCISION: SHX744

## 2015-10-26 SURGERY — EXCISION, PILONIDAL CYST, EXTENSIVE
Anesthesia: General | Site: Back

## 2015-10-26 MED ORDER — PROPOFOL 10 MG/ML IV BOLUS
INTRAVENOUS | Status: DC | PRN
  Administered 2015-10-26: 50 mg via INTRAVENOUS
  Administered 2015-10-26: 300 mg via INTRAVENOUS
  Administered 2015-10-26: 50 mg via INTRAVENOUS

## 2015-10-26 MED ORDER — SODIUM CHLORIDE 0.9% FLUSH: 3.0000 mL | INTRAVENOUS | Status: DC | PRN

## 2015-10-26 MED ORDER — OXYCODONE HCL 5 MG PO TABS
ORAL_TABLET | ORAL | Status: AC
  Filled 2015-10-26: qty 1

## 2015-10-26 MED ORDER — OXYCODONE HCL 5 MG/5ML PO SOLN: 5.0000 mg | Freq: Once | ORAL | Status: AC | PRN

## 2015-10-26 MED ORDER — ACETAMINOPHEN 325 MG PO TABS: 650.0000 mg | ORAL_TABLET | ORAL | Status: DC | PRN

## 2015-10-26 MED ORDER — ONDANSETRON HCL 4 MG/2ML IJ SOLN: 4.0000 mg | Freq: Once | INTRAMUSCULAR | Status: DC | PRN

## 2015-10-26 MED ORDER — SODIUM CHLORIDE 0.9 % IV SOLN: 250.0000 mL | INTRAVENOUS | Status: DC | PRN

## 2015-10-26 MED ORDER — OXYCODONE-ACETAMINOPHEN 5-325 MG PO TABS: 1.0000 | ORAL_TABLET | ORAL | 0 refills | Status: DC | PRN

## 2015-10-26 MED ORDER — LIDOCAINE HCL (CARDIAC) 20 MG/ML IV SOLN
INTRAVENOUS | Status: DC | PRN
  Administered 2015-10-26: 100 mg via INTRAVENOUS

## 2015-10-26 MED ORDER — BUPIVACAINE-EPINEPHRINE 0.5% -1:200000 IJ SOLN
INTRAMUSCULAR | Status: DC | PRN
  Administered 2015-10-26: 30 mL

## 2015-10-26 MED ORDER — CHLORHEXIDINE GLUCONATE CLOTH 2 % EX PADS: 6.0000 | MEDICATED_PAD | Freq: Once | CUTANEOUS | Status: DC

## 2015-10-26 MED ORDER — GLYCOPYRROLATE 0.2 MG/ML IJ SOLN
0.2000 mg | Freq: Once | INTRAMUSCULAR | Status: AC | PRN
  Administered 2015-10-26: 0.2 mg via INTRAVENOUS

## 2015-10-26 MED ORDER — OXYCODONE HCL 5 MG PO TABS
5.0000 mg | ORAL_TABLET | Freq: Once | ORAL | Status: AC | PRN
  Administered 2015-10-26: 5 mg via ORAL

## 2015-10-26 MED ORDER — SUCCINYLCHOLINE CHLORIDE 20 MG/ML IJ SOLN
INTRAMUSCULAR | Status: DC | PRN
  Administered 2015-10-26: 140 mg via INTRAVENOUS

## 2015-10-26 MED ORDER — CEFAZOLIN SODIUM-DEXTROSE 2-4 GM/100ML-% IV SOLN
INTRAVENOUS | Status: AC
  Filled 2015-10-26: qty 100

## 2015-10-26 MED ORDER — DEXAMETHASONE SODIUM PHOSPHATE 10 MG/ML IJ SOLN
INTRAMUSCULAR | Status: AC
  Filled 2015-10-26: qty 1

## 2015-10-26 MED ORDER — DEXAMETHASONE SODIUM PHOSPHATE 4 MG/ML IJ SOLN
INTRAMUSCULAR | Status: DC | PRN
  Administered 2015-10-26: 10 mg via INTRAVENOUS

## 2015-10-26 MED ORDER — FENTANYL CITRATE (PF) 100 MCG/2ML IJ SOLN
INTRAMUSCULAR | Status: AC
  Filled 2015-10-26: qty 2

## 2015-10-26 MED ORDER — SUCCINYLCHOLINE CHLORIDE 200 MG/10ML IV SOSY
PREFILLED_SYRINGE | INTRAVENOUS | Status: AC
  Filled 2015-10-26: qty 10

## 2015-10-26 MED ORDER — LIDOCAINE 2% (20 MG/ML) 5 ML SYRINGE
INTRAMUSCULAR | Status: AC
  Filled 2015-10-26: qty 5

## 2015-10-26 MED ORDER — LACTATED RINGERS IV SOLN
INTRAVENOUS | Status: DC
  Administered 2015-10-26 (×2): via INTRAVENOUS

## 2015-10-26 MED ORDER — KETOROLAC TROMETHAMINE 30 MG/ML IJ SOLN
INTRAMUSCULAR | Status: DC | PRN
  Administered 2015-10-26: 30 mg via INTRAVENOUS

## 2015-10-26 MED ORDER — HYDROMORPHONE HCL 1 MG/ML IJ SOLN
0.2500 mg | INTRAMUSCULAR | Status: DC | PRN
  Administered 2015-10-26: 0.5 mg via INTRAVENOUS

## 2015-10-26 MED ORDER — ONDANSETRON HCL 4 MG/2ML IJ SOLN
INTRAMUSCULAR | Status: AC
  Filled 2015-10-26: qty 2

## 2015-10-26 MED ORDER — ONDANSETRON HCL 4 MG/2ML IJ SOLN
INTRAMUSCULAR | Status: DC | PRN
  Administered 2015-10-26: 4 mg via INTRAVENOUS

## 2015-10-26 MED ORDER — SCOPOLAMINE 1 MG/3DAYS TD PT72: 1.0000 | MEDICATED_PATCH | Freq: Once | TRANSDERMAL | Status: DC | PRN

## 2015-10-26 MED ORDER — FENTANYL CITRATE (PF) 100 MCG/2ML IJ SOLN
50.0000 ug | INTRAMUSCULAR | Status: DC | PRN
  Administered 2015-10-26: 100 ug via INTRAVENOUS

## 2015-10-26 MED ORDER — 0.9 % SODIUM CHLORIDE (POUR BTL) OPTIME
TOPICAL | Status: DC | PRN
  Administered 2015-10-26: 1000 mL

## 2015-10-26 MED ORDER — MEPERIDINE HCL 25 MG/ML IJ SOLN: 6.2500 mg | INTRAMUSCULAR | Status: DC | PRN

## 2015-10-26 MED ORDER — OXYCODONE HCL 5 MG PO TABS: 5.0000 mg | ORAL_TABLET | ORAL | Status: DC | PRN

## 2015-10-26 MED ORDER — KETOROLAC TROMETHAMINE 30 MG/ML IJ SOLN
INTRAMUSCULAR | Status: AC
  Filled 2015-10-26: qty 1

## 2015-10-26 MED ORDER — SODIUM CHLORIDE 0.9% FLUSH: 3.0000 mL | Freq: Two times a day (BID) | INTRAVENOUS | Status: DC

## 2015-10-26 MED ORDER — DOXYCYCLINE HYCLATE 100 MG PO TABS: 100.0000 mg | ORAL_TABLET | Freq: Two times a day (BID) | ORAL | 1 refills | Status: DC

## 2015-10-26 MED ORDER — MORPHINE SULFATE (PF) 2 MG/ML IV SOLN: 1.0000 mg | INTRAVENOUS | Status: DC | PRN

## 2015-10-26 MED ORDER — ACETAMINOPHEN 650 MG RE SUPP: 650.0000 mg | RECTAL | Status: DC | PRN

## 2015-10-26 MED ORDER — HYDROMORPHONE HCL 1 MG/ML IJ SOLN
INTRAMUSCULAR | Status: AC
  Filled 2015-10-26: qty 1

## 2015-10-26 MED ORDER — MIDAZOLAM HCL 2 MG/2ML IJ SOLN
INTRAMUSCULAR | Status: AC
  Filled 2015-10-26: qty 2

## 2015-10-26 MED ORDER — FLUCONAZOLE 150 MG PO TABS: ORAL_TABLET | ORAL | 2 refills | Status: DC

## 2015-10-26 MED ORDER — MIDAZOLAM HCL 2 MG/2ML IJ SOLN
1.0000 mg | INTRAMUSCULAR | Status: DC | PRN
  Administered 2015-10-26: 2 mg via INTRAVENOUS

## 2015-10-26 MED ORDER — CEFAZOLIN SODIUM-DEXTROSE 2-4 GM/100ML-% IV SOLN
2.0000 g | INTRAVENOUS | Status: AC
  Administered 2015-10-26: 2 g via INTRAVENOUS

## 2015-10-26 MED FILL — FLUCONAZOLE 150 MG TABLET: 150 | 3 days supply | Qty: 2 | Fill #0

## 2015-10-26 MED FILL — OXYCODONE/APAP 5-325: 5-325 | 3 days supply | Qty: 40 | Fill #0

## 2015-10-26 MED FILL — DOXYCYCLINE HYCLATE 100 MG: 100 | 7 days supply | Qty: 14 | Fill #0

## 2015-10-26 SURGICAL SUPPLY — 53 items
BENZOIN TINCTURE PRP APPL 2/3 (GAUZE/BANDAGES/DRESSINGS) IMPLANT
BLADE CLIPPER SURG (BLADE) IMPLANT
BLADE SURG 15 STRL LF DISP TIS (BLADE) ×1 IMPLANT
BLADE SURG 15 STRL SS (BLADE) ×1
BRIEF STRETCH FOR OB PAD LRG (UNDERPADS AND DIAPERS) ×2 IMPLANT
CANISTER SUCT 1200ML W/VALVE (MISCELLANEOUS) ×2 IMPLANT
CLEANER CAUTERY TIP 5X5 PAD (MISCELLANEOUS) IMPLANT
COVER BACK TABLE 60X90IN (DRAPES) ×2 IMPLANT
COVER MAYO STAND STRL (DRAPES) ×2 IMPLANT
DECANTER SPIKE VIAL GLASS SM (MISCELLANEOUS) IMPLANT
DRAIN CHANNEL 10F 3/8 F FF (DRAIN) IMPLANT
DRAIN PENROSE 1/4X12 LTX STRL (WOUND CARE) IMPLANT
DRAPE LAPAROTOMY T 102X78X121 (DRAPES) ×2 IMPLANT
DRAPE UTILITY XL STRL (DRAPES) ×2 IMPLANT
DRSG PAD ABDOMINAL 8X10 ST (GAUZE/BANDAGES/DRESSINGS) IMPLANT
ELECT REM PT RETURN 9FT ADLT (ELECTROSURGICAL) ×2
ELECTRODE REM PT RTRN 9FT ADLT (ELECTROSURGICAL) ×1 IMPLANT
EVACUATOR SILICONE 100CC (DRAIN) IMPLANT
GAUZE SPONGE 4X4 12PLY STRL (GAUZE/BANDAGES/DRESSINGS) ×2 IMPLANT
GLOVE BIO SURGEON STRL SZ8 (GLOVE) ×2 IMPLANT
GLOVE BIOGEL PI IND STRL 8 (GLOVE) ×1 IMPLANT
GLOVE BIOGEL PI INDICATOR 8 (GLOVE) ×1
GLOVE EXAM NITRILE MD LF STRL (GLOVE) ×2 IMPLANT
GLOVE SURG SIGNA 7.5 PF LTX (GLOVE) ×2 IMPLANT
GOWN STRL REUS W/ TWL LRG LVL3 (GOWN DISPOSABLE) ×1 IMPLANT
GOWN STRL REUS W/TWL LRG LVL3 (GOWN DISPOSABLE) ×1
NEEDLE HYPO 22GX1.5 SAFETY (NEEDLE) ×2 IMPLANT
NS IRRIG 1000ML POUR BTL (IV SOLUTION) ×2 IMPLANT
PACK BASIN DAY SURGERY FS (CUSTOM PROCEDURE TRAY) ×2 IMPLANT
PAD CLEANER CAUTERY TIP 5X5 (MISCELLANEOUS)
PENCIL BUTTON HOLSTER BLD 10FT (ELECTRODE) ×2 IMPLANT
SPONGE LAP 4X18 X RAY DECT (DISPOSABLE) ×2 IMPLANT
SUCTION FRAZIER HANDLE 10FR (MISCELLANEOUS)
SUCTION TUBE FRAZIER 10FR DISP (MISCELLANEOUS) IMPLANT
SUT CHROMIC 3 0 SH 27 (SUTURE) IMPLANT
SUT ETHILON 2 0 FS 18 (SUTURE) ×4 IMPLANT
SUT ETHILON 3 0 FSL (SUTURE) IMPLANT
SUT ETHILON 4 0 PS 2 18 (SUTURE) IMPLANT
SUT MNCRL AB 3-0 PS2 18 (SUTURE) IMPLANT
SUT VIC AB 2-0 CT1 27 (SUTURE) ×1
SUT VIC AB 2-0 CT1 TAPERPNT 27 (SUTURE) ×1 IMPLANT
SUT VIC AB 3-0 CT1 27 (SUTURE)
SUT VIC AB 3-0 CT1 27XBRD (SUTURE) IMPLANT
SUT VIC AB 4-0 SH 18 (SUTURE) IMPLANT
SUT VICRYL 4-0 PS2 18IN ABS (SUTURE) IMPLANT
SWAB COLLECTION DEVICE MRSA (MISCELLANEOUS) IMPLANT
SYR CONTROL 10ML LL (SYRINGE) ×2 IMPLANT
TAPE CLOTH 3X10 TAN LF (GAUZE/BANDAGES/DRESSINGS) ×2 IMPLANT
TOWEL OR 17X24 6PK STRL BLUE (TOWEL DISPOSABLE) ×2 IMPLANT
TOWEL OR NON WOVEN STRL DISP B (DISPOSABLE) ×2 IMPLANT
TRAY DSU PREP LF (CUSTOM PROCEDURE TRAY) ×2 IMPLANT
TUBE CONNECTING 20X1/4 (TUBING) ×2 IMPLANT
YANKAUER SUCT BULB TIP NO VENT (SUCTIONS) ×2 IMPLANT

## 2015-10-26 NOTE — Anesthesia Procedure Notes (Signed)
Procedure Name: Intubation Date/Time: 10/26/2015 8:28 AM Performed by: Lieutenant Diego Pre-anesthesia Checklist: Patient identified, Emergency Drugs available, Suction available and Patient being monitored Patient Re-evaluated:Patient Re-evaluated prior to inductionOxygen Delivery Method: Circle system utilized Preoxygenation: Pre-oxygenation with 100% oxygen Intubation Type: IV induction Ventilation: Mask ventilation without difficulty Laryngoscope size: glidescope. Grade View: Grade II Tube type: Oral Number of attempts: 2 Airway Equipment and Method: Oral airway,  Rigid stylet and Video-laryngoscopy Placement Confirmation: ETT inserted through vocal cords under direct vision,  positive ETCO2 and breath sounds checked- equal and bilateral Secured at: 22 cm Tube secured with: Tape Dental Injury: Teeth and Oropharynx as per pre-operative assessment and Bloody posterior oropharynx  Difficulty Due To: Difficulty was anticipated, Difficult Airway- due to large tongue and Difficult Airway- due to anterior larynx Future Recommendations: Recommend- induction with short-acting agent, and alternative techniques readily available Comments: Elective glide scope based on airway exam. Placed scope in oropharynx, good view, difficultly passing ett, sats 90%, removed bagged with 100% and airway. Placed glide scope second time grade 2 view, passed ett thru vocal cords, positive etCO2, =BBS.

## 2015-10-26 NOTE — Transfer of Care (Signed)
Immediate Anesthesia Transfer of Care Note  Patient: Austine A Decarolis  Procedure(s) Performed: Procedure(s): CYST EXCISION PILONIDAL (N/A)  Patient Location: PACU  Anesthesia Type:General  Level of Consciousness: sedated  Airway & Oxygen Therapy: Patient Spontanous Breathing and Patient connected to face mask oxygen  Post-op Assessment: Report given to RN and Post -op Vital signs reviewed and stable  Post vital signs: Reviewed and stable  Last Vitals:  Vitals:   10/26/15 0715  BP: (!) 128/58  Pulse: 91  Resp: 18  Temp: 36.8 C    Last Pain:  Vitals:   10/26/15 0715  TempSrc: Oral      Patients Stated Pain Goal: 0 ([id number])  Complications: No apparent anesthesia complications

## 2015-10-26 NOTE — Op Note (Signed)
NAMERYDER, LONSDALE                 ACCOUNT NO.:  [id number]  MEDICAL RECORD NO.:  [id number]  LOCATION:                                 FACILITY:  PHYSICIAN:  Coralie Keens, M.D. DATE OF BIRTH:  11-03-1985  DATE OF PROCEDURE:  10/26/2015 DATE OF DISCHARGE:                              OPERATIVE REPORT   PREOPERATIVE DIAGNOSIS:  Chronic pilonidal cyst.  POSTOPERATIVE DIAGNOSIS:  Chronic pilonidal cyst.  PROCEDURE:  Excision of chronic pilonidal cyst.  SURGEON:  Coralie Keens, MD.  ANESTHESIA:  General and 0.5% Marcaine.  ESTIMATED BLOOD LOSS:  Minimal.  PROCEDURE IN DETAIL:  The patient was brought to the operating room, identified as Maria Maddox.  General anesthesia was induced on the stretcher.  She was then placed in a prone position on the operating room table.  Her gluteal cleft and lower back were then prepped and draped in usual sterile fashion.  I made an elliptical incision at the gluteal cleft that included all the chronic ingrown hairs at the pilonidal cystic area.  I took this down into the subcutaneous tissue with electrocautery.  I then performed a wide excision of the chronic pilonidal tissue with the electrocautery going all the way down to the sacrum.  Once the tissue was removed, I achieved hemostasis with cautery.  I then irrigated the wound with a liter of normal saline.  I then anesthetized the wound circumferentially with Marcaine.  I then closed subcutaneous tissue with interrupted 2-0 Vicryl sutures and closed the skin with interrupted 2-0 nylon sutures.  Gauze and tape were then applied.  The patient tolerated the procedure well.  All sponge, needle, and instrument counts were correct at the end of the procedure. The patient was then extubated in the operating room and taken in a stable condition to recovery room.     Coralie Keens, M.D.   ______________________________ Coralie Keens, M.D.    DB/MEDQ  D:  10/26/2015  T:   10/26/2015  Job:  [id number]

## 2015-10-26 NOTE — Interval H&P Note (Signed)
History and Physical Interval Note: no change in H and P  10/26/2015 7:49 AM  Maria Maddox  has presented today for surgery, with the diagnosis of Chronic pilonidal cyst   The various methods of treatment have been discussed with the patient and family. After consideration of risks, benefits and other options for treatment, the patient has consented to  Procedure(s): CYST EXCISION PILONIDAL (N/A) as a surgical intervention .  The patient's history has been reviewed, patient examined, no change in status, stable for surgery.  I have reviewed the patient's chart and labs.  Questions were answered to the patient's satisfaction.     Margene Cherian A

## 2015-10-26 NOTE — Anesthesia Postprocedure Evaluation (Signed)
Anesthesia Post Note  Patient: Maria Maddox  Procedure(s) Performed: Procedure(s) (LRB): CYST EXCISION PILONIDAL (N/A)  Patient location during evaluation: PACU Anesthesia Type: General Level of consciousness: awake and alert Pain management: pain level controlled Vital Signs Assessment: post-procedure vital signs reviewed and stable Respiratory status: spontaneous breathing, nonlabored ventilation and respiratory function stable Cardiovascular status: blood pressure returned to baseline and stable Postop Assessment: no signs of nausea or vomiting Anesthetic complications: no    Last Vitals:  Vitals:   10/26/15 0945 10/26/15 1011  BP: (!) 117/58 134/69  Pulse: 93 93  Resp: 16 20  Temp:  36.9 C    Last Pain:  Vitals:   10/26/15 1011  TempSrc:   PainSc: 0-No pain                 Culver Feighner A

## 2015-10-26 NOTE — Anesthesia Preprocedure Evaluation (Signed)
Anesthesia Evaluation  Patient identified by MRN, date of birth, ID band Patient awake    Reviewed: Allergy & Precautions, NPO status , Patient's Chart, lab work & pertinent test results  Airway Mallampati: I  TM Distance: >3 FB Neck ROM: Full    Dental  (+) Teeth Intact, Dental Advisory Given   Pulmonary    breath sounds clear to auscultation       Cardiovascular  Rhythm:Regular Rate:Normal     Neuro/Psych    GI/Hepatic   Endo/Other  Morbid obesity  Renal/GU      Musculoskeletal   Abdominal   Peds  Hematology   Anesthesia Other Findings   Reproductive/Obstetrics                             Anesthesia Physical Anesthesia Plan  ASA: II  Anesthesia Plan: General   Post-op Pain Management:    Induction: Intravenous  Airway Management Planned: Oral ETT  Additional Equipment:   Intra-op Plan:   Post-operative Plan: Extubation in OR  Informed Consent: I have reviewed the patients History and Physical, chart, labs and discussed the procedure including the risks, benefits and alternatives for the proposed anesthesia with the patient or authorized representative who has indicated his/her understanding and acceptance.   Dental advisory given  Plan Discussed with: CRNA, Anesthesiologist and Surgeon  Anesthesia Plan Comments:         Anesthesia Quick Evaluation

## 2015-10-26 NOTE — Op Note (Signed)
CYST EXCISION PILONIDAL  Procedure Note  MADELL RATZEL 10/26/2015   Pre-op Diagnosis: Chronic pilonidal cyst      Post-op Diagnosis: same  Procedure(s): CYST EXCISION PILONIDAL  Surgeon(s): Coralie Keens, MD  Anesthesia: General  Staff:  Circulator: Maurene Capes, RN Scrub Person: Vickey Huger, RN  Estimated Blood Loss: Minimal                         Maria Maddox   Date: 10/26/2015  Time: 8:56 AM

## 2015-10-26 NOTE — Discharge Instructions (Signed)
Ok to shower starting tomorrow  Expect drainage from incision.  Cover with dry gauze  Ice pack and ibuprofen also for pain     Post Anesthesia Home Care Instructions  Activity: Get plenty of rest for the remainder of the day. A responsible adult should stay with you for 24 hours following the procedure.  For the next 24 hours, DO NOT: -Drive a car -Paediatric nurse -Drink alcoholic beverages -Take any medication unless instructed by your physician -Make any legal decisions or sign important papers.  Meals: Start with liquid foods such as gelatin or soup. Progress to regular foods as tolerated. Avoid greasy, spicy, heavy foods. If nausea and/or vomiting occur, drink only clear liquids until the nausea and/or vomiting subsides. Call your physician if vomiting continues.  Special Instructions/Symptoms: Your throat may feel dry or sore from the anesthesia or the breathing tube placed in your throat during surgery. If this causes discomfort, gargle with warm salt water. The discomfort should disappear within 24 hours.  If you had a scopolamine patch placed behind your ear for the management of post- operative nausea and/or vomiting:  1. The medication in the patch is effective for 72 hours, after which it should be removed.  Wrap patch in a tissue and discard in the trash. Wash hands thoroughly with soap and water. 2. You may remove the patch earlier than 72 hours if you experience unpleasant side effects which may include dry mouth, dizziness or visual disturbances. 3. Avoid touching the patch. Wash your hands with soap and water after contact with the patch.

## 2015-10-26 NOTE — Transfer of Care (Signed)
Immediate Anesthesia Transfer of Care Note  Patient: Evellyn A Lobban  Procedure(s) Performed: Procedure(s): CYST EXCISION PILONIDAL (N/A)  Patient Location: PACU  Anesthesia Type:General  Level of Consciousness: sedated  Airway & Oxygen Therapy: Patient Spontanous Breathing and Patient connected to face mask oxygen  Post-op Assessment: Report given to RN and Post -op Vital signs reviewed and stable  Post vital signs: Reviewed and stable  Last Vitals:  Vitals:   10/26/15 0715  BP: (!) 128/58  Pulse: 91  Resp: 18  Temp: 36.8 C    Last Pain:  Vitals:   10/26/15 0715  TempSrc: Oral      Patients Stated Pain Goal: 0 ([id number])  Complications: No apparent anesthesia complications

## 2015-10-27 ENCOUNTER — Encounter (HOSPITAL_BASED_OUTPATIENT_CLINIC_OR_DEPARTMENT_OTHER): Payer: Self-pay | Admitting: Surgery

## 2015-11-02 MED FILL — HYDROCODON-APAP 5-325: 5-325 | 3 days supply | Qty: 30 | Fill #0

## 2015-11-25 MED FILL — DOXYCYCLINE HYCLATE 100 MG: 100 | 7 days supply | Qty: 14 | Fill #0

## 2015-11-25 MED FILL — HYDROCODON-APAP 5-325: 5-325 | 3 days supply | Qty: 20 | Fill #0

## 2015-11-30 MED FILL — HYDROCODON-APAP 5-325: 5-325 | 7 days supply | Qty: 40 | Fill #0

## 2016-01-13 DIAGNOSIS — H44511 Absolute glaucoma, right eye: Secondary | ICD-10-CM | POA: Diagnosis not present

## 2016-01-13 MED FILL — BRIMONIDINE 0.2% EYE DROP: 0.2 | 90 days supply | Qty: 10 | Fill #0

## 2016-01-13 MED FILL — DORZOLAMIDE-TIMOLOL EYE DRP: 22.3-6.8 | 90 days supply | Qty: 10 | Fill #0

## 2016-05-17 ENCOUNTER — Encounter: Payer: Self-pay | Admitting: Family Medicine

## 2016-06-26 ENCOUNTER — Encounter: Payer: Self-pay | Admitting: Family Medicine

## 2016-07-11 ENCOUNTER — Emergency Department (HOSPITAL_COMMUNITY): Payer: 59

## 2016-07-11 ENCOUNTER — Encounter (HOSPITAL_COMMUNITY): Payer: Self-pay

## 2016-07-11 ENCOUNTER — Emergency Department (HOSPITAL_COMMUNITY)
Admission: EM | Admit: 2016-07-11 | Discharge: 2016-07-11 | Disposition: A | Discharge status: Home / Self Care | Payer: 59 | Attending: Emergency Medicine | Admitting: Emergency Medicine

## 2016-07-11 DIAGNOSIS — R0789 Other chest pain: Secondary | ICD-10-CM | POA: Insufficient documentation

## 2016-07-11 DIAGNOSIS — R51 Headache: Secondary | ICD-10-CM | POA: Insufficient documentation

## 2016-07-11 DIAGNOSIS — M545 Low back pain: Secondary | ICD-10-CM | POA: Diagnosis not present

## 2016-07-11 DIAGNOSIS — Y999 Unspecified external cause status: Secondary | ICD-10-CM | POA: Diagnosis not present

## 2016-07-11 DIAGNOSIS — S0990XA Unspecified injury of head, initial encounter: Secondary | ICD-10-CM | POA: Diagnosis not present

## 2016-07-11 DIAGNOSIS — Y939 Activity, unspecified: Secondary | ICD-10-CM | POA: Insufficient documentation

## 2016-07-11 DIAGNOSIS — Y92410 Unspecified street and highway as the place of occurrence of the external cause: Secondary | ICD-10-CM | POA: Insufficient documentation

## 2016-07-11 DIAGNOSIS — S299XXA Unspecified injury of thorax, initial encounter: Secondary | ICD-10-CM | POA: Diagnosis present

## 2016-07-11 DIAGNOSIS — R079 Chest pain, unspecified: Secondary | ICD-10-CM | POA: Diagnosis not present

## 2016-07-11 DIAGNOSIS — M542 Cervicalgia: Secondary | ICD-10-CM | POA: Diagnosis not present

## 2016-07-11 DIAGNOSIS — S199XXA Unspecified injury of neck, initial encounter: Secondary | ICD-10-CM | POA: Diagnosis not present

## 2016-07-11 LAB — POC URINE PREG, ED: Preg Test, Ur: NEGATIVE

## 2016-07-11 MED ORDER — IBUPROFEN 800 MG PO TABS: 800.0000 mg | ORAL_TABLET | Freq: Four times a day (QID) | ORAL | 0 refills | Status: DC | PRN

## 2016-07-11 MED ORDER — CYCLOBENZAPRINE HCL 10 MG PO TABS: 10.0000 mg | ORAL_TABLET | Freq: Three times a day (TID) | ORAL | 0 refills | Status: DC | PRN

## 2016-07-11 MED ORDER — IBUPROFEN 600 MG PO TABS: 600.0000 mg | ORAL_TABLET | Freq: Three times a day (TID) | ORAL | 0 refills | Status: DC | PRN

## 2016-07-11 MED ORDER — OXYCODONE-ACETAMINOPHEN 5-325 MG PO TABS
2.0000 | ORAL_TABLET | Freq: Once | ORAL | Status: AC
  Administered 2016-07-11: 2 via ORAL
  Filled 2016-07-11: qty 2

## 2016-07-11 MED ORDER — ONDANSETRON 4 MG PO TBDP
4.0000 mg | ORAL_TABLET | Freq: Once | ORAL | Status: AC
  Administered 2016-07-11: 4 mg via ORAL
  Filled 2016-07-11: qty 1

## 2016-07-11 MED ORDER — HYDROCODONE-ACETAMINOPHEN 5-325 MG PO TABS: 1.0000 | ORAL_TABLET | ORAL | 0 refills | Status: DC | PRN
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
## 2016-08-03 MED FILL — IBUPROFEN 600 MG TABLET: 600 | 6 days supply | Qty: 20 | Fill #0

## 2016-08-03 MED FILL — HYDROCODON-APAP 5-325: 5-325 | 2 days supply | Qty: 10 | Fill #0

## 2016-08-03 MED FILL — CYCLOBENZAPRINE 10 MG TAB: 10 | 7 days supply | Qty: 20 | Fill #0

## 2016-08-06 ENCOUNTER — Encounter (HOSPITAL_COMMUNITY): Payer: Self-pay | Admitting: *Deleted

## 2016-08-06 ENCOUNTER — Ambulatory Visit (HOSPITAL_COMMUNITY)
Admission: EM | Admit: 2016-08-06 | Discharge: 2016-08-06 | Disposition: A | Discharge status: Home / Self Care | Payer: 59 | Attending: Family Medicine | Admitting: Family Medicine

## 2016-08-06 DIAGNOSIS — J029 Acute pharyngitis, unspecified: Secondary | ICD-10-CM | POA: Diagnosis not present

## 2016-08-06 DIAGNOSIS — J00 Acute nasopharyngitis [common cold]: Secondary | ICD-10-CM

## 2016-08-06 DIAGNOSIS — R5383 Other fatigue: Secondary | ICD-10-CM | POA: Diagnosis not present

## 2016-08-06 MED ORDER — AMOXICILLIN 875 MG PO TABS: 875.0000 mg | ORAL_TABLET | Freq: Two times a day (BID) | ORAL | 0 refills | Status: DC

## 2016-08-06 MED ORDER — IPRATROPIUM BROMIDE 0.06 % NA SOLN: 2.0000 | Freq: Four times a day (QID) | NASAL | 0 refills | Status: DC

## 2016-08-06 MED ORDER — FLUCONAZOLE 150 MG PO TABS: 150.0000 mg | ORAL_TABLET | Freq: Every day | ORAL | 1 refills | Status: DC

## 2016-08-06 MED ORDER — LIDOCAINE VISCOUS 2 % MT SOLN: OROMUCOSAL | 0 refills | Status: DC

## 2016-08-06 NOTE — ED Provider Notes (Signed)
CSN: [id number]     Arrival date & time 08/06/16  1742 History   First MD Initiated Contact with Patient 08/06/16 1842     Chief Complaint  Patient presents with  . Sore Throat   (Consider location/radiation/quality/duration/timing/severity/associated sxs/prior Treatment) C/o sore throat, uri sx's, ear ache,  And fatigue   The history is provided by the patient.  Sore Throat  This is a new problem. The problem occurs constantly. The problem has not changed since onset.Nothing aggravates the symptoms.    Past Medical History:  Diagnosis Date  . Eczema   . Glaucoma    has had for 10 yrears  . Legally blind in right eye, as defined in [place]   . PCOS (polycystic ovarian syndrome)    Past Surgical History:  Procedure Laterality Date  . EYE SURGERY     right side, numerous surgeries (now blind)  . PILONIDAL CYST EXCISION N/A 10/26/2015   Procedure: CYST EXCISION PILONIDAL;  Surgeon: Coralie Keens, MD;  Location: [facility];  Service: General;  Laterality: N/A;   Family History  Problem Relation Age of Onset  . Arthritis Mother   . Depression Mother   . Diabetes Mother   . Hyperlipidemia Mother   . Arthritis Father   . Cancer Maternal Grandmother        breast  . Diabetes Paternal Grandmother   . Diabetes Paternal Grandfather   . Hearing loss Paternal Grandfather   . Cancer Maternal Aunt 53       Breast Cancer   Social History  Substance Use Topics  . Smoking status: Never Smoker  . Smokeless tobacco: Never Used  . Alcohol use No   OB History    No data available     Review of Systems  Constitutional: Negative.   HENT: Positive for rhinorrhea and sinus pressure.   Eyes: Negative.   Respiratory: Negative.   Cardiovascular: Negative.   Gastrointestinal: Negative.   Endocrine: Negative.   Genitourinary: Negative.   Musculoskeletal: Negative.   Allergic/Immunologic: Negative.   Neurological: Negative.   Hematological: Negative.    Psychiatric/Behavioral: Negative.     Allergies  Patient has no known allergies.  Home Medications   Prior to Admission medications   Medication Sig Start Date End Date Taking? Authorizing Provider  amoxicillin (AMOXIL) 875 MG tablet Take 1 tablet (875 mg total) by mouth 2 (two) times daily. 08/06/16   Lysbeth Penner, FNP  brimonidine (ALPHAGAN P) 0.1 % SOLN Place 1 drop into both eyes 2 (two) times daily.    [provider]  cyclobenzaprine (FLEXERIL) 10 MG tablet Take 1 tablet (10 mg total) by mouth 3 (three) times daily as needed for muscle spasms. 07/11/16   Duffy Bruce, MD  dorzolamide-timolol (COSOPT) 22.3-6.8 MG/ML ophthalmic solution Place 1 drop into the right eye daily.    [provider]  doxycycline (VIBRA-TABS) 100 MG tablet Take 1 tablet (100 mg total) by mouth 2 (two) times daily. 10/26/15   Coralie Keens, MD  fluconazole (DIFLUCAN) 150 MG tablet Take 1 tablet repeat in 3 days 10/26/15   Coralie Keens, MD  fluconazole (DIFLUCAN) 150 MG tablet Take 1 tablet (150 mg total) by mouth daily. 08/06/16   Lysbeth Penner, FNP  HYDROcodone-acetaminophen (NORCO/VICODIN) 5-325 MG tablet Take 1-2 tablets by mouth every 4 (four) hours as needed for moderate pain or severe pain. 07/11/16   Duffy Bruce, MD  ibuprofen (ADVIL,MOTRIN) 600 MG tablet Take 1 tablet (600 mg total) by mouth every  8 (eight) hours as needed. 07/11/16   Duffy Bruce, MD  ipratropium (ATROVENT) 0.06 % nasal spray Place 2 sprays into both nostrils 4 (four) times daily. 08/06/16   Lysbeth Penner, FNP  lidocaine (XYLOCAINE) 2 % solution Take 10 ml po q 4 hours prn throat pain 08/06/16   Lysbeth Penner, FNP  oxyCODONE-acetaminophen (ROXICET) 5-325 MG tablet Take 1-2 tablets by mouth every 4 (four) hours as needed for moderate pain or severe pain. 10/26/15   Coralie Keens, MD   Meds Ordered and Administered this Visit  Medications - No data to display  BP 130/80 (BP Location: Right  Arm)   Pulse 82   Temp 98.6 F (37 C) (Oral)   Resp 18   SpO2 99%  No data found.   Physical Exam  Constitutional: She appears well-developed and well-nourished.  HENT:  Head: Normocephalic and atraumatic.  Right Ear: External ear normal.  Left Ear: External ear normal.  Nose: Nose normal.  OPX - erythematous and swollen and tonsils 2 plus  Eyes: Conjunctivae and EOM are normal. Pupils are equal, round, and reactive to light.  Neck: Normal range of motion. Neck supple.  Cardiovascular: Normal rate, regular rhythm and normal heart sounds.   Pulmonary/Chest: Effort normal and breath sounds normal.  Abdominal: Soft. Bowel sounds are normal.  Lymphadenopathy:    She has cervical adenopathy.  Nursing note and vitals reviewed.   Urgent Care Course     Procedures (including critical care time)  Labs Review Labs Reviewed - No data to display  Imaging Review No results found.   Visual Acuity Review  Right Eye Distance:   Left Eye Distance:   Bilateral Distance:    Right Eye Near:   Left Eye Near:    Bilateral Near:         MDM   1. Pharyngitis, unspecified etiology   2. Acute nasopharyngitis    Amoxicillin 875mg  one po bid x 7 days #14 Lidocaine oral solution q 4 hours prn #143ml Diflucan 150mg  one po qd #2 Atrovent nasal spray  Push po fluids, rest, tylenol and motrin otc prn as directed for fever, arthralgias, and myalgias.  Follow up prn if sx's continue or persist.    Lysbeth Penner, [place] 08/06/16 [phone]

## 2016-08-06 NOTE — ED Triage Notes (Signed)
Pt  Reports   Several  Days  Of  sorethroat  With  Pain  When  She   S[place]ows   As   Well  As  Sinus  Congestion  And  r  Earache      Pt   Sitting  Upright  On the  Exam table  In no  Severe  Distress

## 2016-08-07 LAB — POCT RAPID STREP A: Streptococcus, Group A Screen (Direct): NEGATIVE

## 2016-08-07 MED FILL — IPRATROPIUM 0.06% SPRAY: 0.06 | 30 days supply | Qty: 15 | Fill #0

## 2016-08-07 MED FILL — LIDOCAINE 2% VISCOUS SOLN: 2 | 2 days supply | Qty: 100 | Fill #0

## 2016-08-07 MED FILL — FLUCONAZOLE 150 MG TABLET: 150 | 2 days supply | Qty: 2 | Fill #0

## 2016-08-07 MED FILL — AMOXICILLIN 875 MG TABLET: 875 | 7 days supply | Qty: 14 | Fill #0

## 2016-08-08 LAB — CULTURE, GROUP A STREP (THRC)

## 2016-08-10 ENCOUNTER — Encounter: Payer: Self-pay | Admitting: Family Medicine

## 2016-09-28 ENCOUNTER — Encounter: Payer: Self-pay | Admitting: Family Medicine

## 2016-09-28 ENCOUNTER — Ambulatory Visit (INDEPENDENT_AMBULATORY_CARE_PROVIDER_SITE_OTHER): Payer: 59 | Admitting: Family Medicine

## 2016-09-28 VITALS — BP 124/79 | HR 80 | Temp 97.6°F | Resp 18 | Ht 68.11 in | Wt 277.0 lb

## 2016-09-28 DIAGNOSIS — E119 Type 2 diabetes mellitus without complications: Secondary | ICD-10-CM | POA: Diagnosis not present

## 2016-09-28 DIAGNOSIS — E7439 Other disorders of intestinal carbohydrate absorption: Secondary | ICD-10-CM | POA: Diagnosis not present

## 2016-09-28 DIAGNOSIS — B373 Candidiasis of vulva and vagina: Secondary | ICD-10-CM | POA: Diagnosis not present

## 2016-09-28 DIAGNOSIS — R300 Dysuria: Secondary | ICD-10-CM

## 2016-09-28 DIAGNOSIS — E282 Polycystic ovarian syndrome: Secondary | ICD-10-CM | POA: Diagnosis not present

## 2016-09-28 DIAGNOSIS — N898 Other specified noninflammatory disorders of vagina: Secondary | ICD-10-CM

## 2016-09-28 DIAGNOSIS — L298 Other pruritus: Secondary | ICD-10-CM | POA: Diagnosis not present

## 2016-09-28 DIAGNOSIS — B3731 Acute candidiasis of vulva and vagina: Secondary | ICD-10-CM

## 2016-09-28 LAB — POCT WET + KOH PREP
Trich by wet prep: ABSENT
YEAST BY WET PREP: ABSENT
Yeast by KOH: ABSENT

## 2016-09-28 LAB — POCT GLYCOSYLATED HEMOGLOBIN (HGB A1C): Hemoglobin A1C: 10

## 2016-09-28 LAB — POCT URINALYSIS DIP (MANUAL ENTRY)
Bilirubin, UA: NEGATIVE
Ketones, POC UA: NEGATIVE mg/dL
Leukocytes, UA: NEGATIVE
Nitrite, UA: NEGATIVE
Protein Ur, POC: NEGATIVE mg/dL
SPEC GRAV UA: 1.025 (ref 1.010–1.025)
Urobilinogen, UA: 0.2 E.U./dL
pH, UA: 5.5 (ref 5.0–8.0)

## 2016-09-28 LAB — GLUCOSE, POCT (MANUAL RESULT ENTRY): POC GLUCOSE: 267 mg/dL — AB (ref 70–99)

## 2016-09-28 MED ORDER — METFORMIN HCL ER 500 MG PO TB24: 500.0000 mg | ORAL_TABLET | Freq: Two times a day (BID) | ORAL | 0 refills | Status: DC

## 2016-09-28 MED ORDER — FLUCONAZOLE 150 MG PO TABS: ORAL_TABLET | ORAL | 0 refills | Status: DC

## 2016-09-28 MED FILL — METFORMIN HCL ER 500 MG TAB: 500 | 90 days supply | Qty: 180 | Fill #0

## 2016-09-28 MED FILL — FLUCONAZOLE 150 MG TABLET: 150 | 4 days supply | Qty: 2 | Fill #0

## 2016-09-28 NOTE — Patient Instructions (Signed)
I recommend weight loss, exercise, and low-carbohydrate low-sugar food choices.   You should AVOID: regular sodas, sweetened tea, fruit juices.  You should LIMIT: breads, pastas, rice, potatoes, and desserts/sweets.    I would recommend limiting your total carbohydrate intake per meal to 45 grams; I would limit your total carbohydrate intake per snack to 30 grams.    I would also have a goal of 60 grams of protein intake per day; this would equal 10-15 grams of protein per meal and 5-10 grams of protein per snack.    Carbohydrate Counting for Diabetes Mellitus, Adult Carbohydrate counting is a method for keeping track of how many carbohydrates you eat. Eating carbohydrates naturally increases the amount of sugar (glucose) in the blood. Counting how many carbohydrates you eat helps keep your blood glucose within normal limits, which helps you manage your diabetes (diabetes mellitus). It is important to know how many carbohydrates you can safely have in each meal. This is different for every person. A diet and nutrition specialist (registered dietitian) can help you make a meal plan and calculate how many carbohydrates you should have at each meal and snack. Carbohydrates are found in the following foods:  Grains, such as breads and cereals.  Dried beans and soy products.  Starchy vegetables, such as potatoes, peas, and corn.  Fruit and fruit juices.  Milk and yogurt.  Sweets and snack foods, such as cake, cookies, candy, chips, and soft drinks.  How do I count carbohydrates? There are two ways to count carbohydrates in food. You can use either of the methods or a combination of both. Reading "Nutrition Facts" on packaged food The "Nutrition Facts" list is included on the labels of almost all packaged foods and beverages in the [place] It includes:  The serving size.  Information about nutrients in each serving, including the grams (g) of carbohydrate per serving.  To use the  "Nutrition Facts":  Decide how many servings you will have.  Multiply the number of servings by the number of carbohydrates per serving.  The resulting number is the total amount of carbohydrates that you will be having.  Learning standard serving sizes of other foods When you eat foods containing carbohydrates that are not packaged or do not include "Nutrition Facts" on the label, you need to measure the servings in order to count the amount of carbohydrates:  Measure the foods that you will eat with a food scale or measuring cup, if needed.  Decide how many standard-size servings you will eat.  Multiply the number of servings by 15. Most carbohydrate-rich foods have about 15 g of carbohydrates per serving. ? For example, if you eat 8 oz (170 g) of strawberries, you will have eaten 2 servings and 30 g of carbohydrates (2 servings x 15 g = 30 g).  For foods that have more than one food mixed, such as soups and casseroles, you must count the carbohydrates in each food that is included.  The following list contains standard serving sizes of common carbohydrate-rich foods. Each of these servings has about 15 g of carbohydrates:   hamburger bun or  English muffin.   oz (15 mL) syrup.   oz (14 g) jelly.  1 slice of bread.  1 six-inch tortilla.  3 oz (85 g) cooked rice or pasta.  4 oz (113 g) cooked dried beans.  4 oz (113 g) starchy vegetable, such as peas, corn, or potatoes.  4 oz (113 g) hot cereal.  4 oz (113  g) mashed potatoes or  of a large baked potato.  4 oz (113 g) canned or frozen fruit.  4 oz (120 mL) fruit juice.  4-6 crackers.  6 chicken nuggets.  6 oz (170 g) unsweetened dry cereal.  6 oz (170 g) plain fat-free yogurt or yogurt sweetened with artificial sweeteners.  8 oz (240 mL) milk.  8 oz (170 g) fresh fruit or one small piece of fruit.  24 oz (680 g) popped popcorn.  Example of carbohydrate counting Sample meal  3 oz (85 g) chicken  breast.  6 oz (170 g) brown rice.  4 oz (113 g) corn.  8 oz (240 mL) milk.  8 oz (170 g) strawberries with sugar-free whipped topping. Carbohydrate calculation 1. Identify the foods that contain carbohydrates: ? Rice. ? Corn. ? Milk. ? Strawberries. 2. Calculate how many servings you have of each food: ? 2 servings rice. ? 1 serving corn. ? 1 serving milk. ? 1 serving strawberries. 3. Multiply each number of servings by 15 g: ? 2 servings rice x 15 g = 30 g. ? 1 serving corn x 15 g = 15 g. ? 1 serving milk x 15 g = 15 g. ? 1 serving strawberries x 15 g = 15 g. 4. Add together all of the amounts to find the total grams of carbohydrates eaten: ? 30 g + 15 g + 15 g + 15 g = 75 g of carbohydrates total. This information is not intended to replace advice given to you by your health care provider. Make sure you discuss any questions you have with your health care provider. Document Released: 03/05/2005 Document Revised: 09/23/2015 Document Reviewed: 08/17/2015 Elsevier Interactive Patient Education  [employer].

## 2016-09-28 NOTE — Progress Notes (Signed)
Subjective:    Patient ID: Maria Maddox, female    DOB: 29-Apr-1985, 31 y.o.   MRN: [id number]  09/28/2016  Dysuria (since Saturday )   HPI This 31 y.o. female presents for evaluation of dysuria.   Always occurs after intercourse.  Always sensitive in vaginal area.  Gets yeast infection; itching.  No fever/chills/sweats. No abdominal pain.  No n/v.  No diarrhea/constipation.  No urinary frequency.  No hematuria.  Mild bleeding with wiping.  LMP 09/18/16; irregular menses due to PCOS.  No contraception.  Trying to get pregnant.  Not taking PNV.  No vaginal discharge;  +vaginal itching.  No redness.  Around the area; a litle itchy.   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  Lives in [place].    PCP: [facility]  Wt Readings from Last 3 Encounters:  09/28/16 277 lb (125.6 kg)  10/26/15 282 lb (127.9 kg)  09/14/15 280 lb (127 kg)   BP Readings from Last 3 Encounters:  09/28/16 124/79  08/06/16 130/80  07/11/16 111/79    There is no immunization history on file for this patient.  Review of Systems  Constitutional: Negative for chills, diaphoresis, fatigue and fever.  Eyes: Negative for visual disturbance.  Respiratory: Negative for cough and shortness of breath.   Cardiovascular: Negative for chest pain, palpitations and leg swelling.  Gastrointestinal: Negative for abdominal pain, constipation, diarrhea, nausea and vomiting.  Endocrine: Negative for cold intolerance, heat intolerance, polydipsia, polyphagia and polyuria.  Genitourinary: Positive for dysuria and urgency. Negative for decreased urine volume, flank pain, frequency, genital sores, hematuria, menstrual problem, pelvic pain, vaginal bleeding, vaginal discharge and vaginal pain.  Neurological: Negative for dizziness, tremors, seizures, syncope, facial asymmetry, speech difficulty, weakness, light-headedness, numbness and headaches.    Past Medical History:  Diagnosis Date  . Eczema   . Glaucoma    has had for 10 yrears  .  Legally blind in right eye, as defined in [place]   . PCOS (polycystic ovarian syndrome)    Past Surgical History:  Procedure Laterality Date  . EYE SURGERY     right side, numerous surgeries (now blind)  . PILONIDAL CYST EXCISION N/A 10/26/2015   Procedure: CYST EXCISION PILONIDAL;  Surgeon: Coralie Keens, MD;  Location: [facility];  Service: General;  Laterality: N/A;   No Known Allergies  Social History   Social History  . Marital status: Single    Spouse name: N/A  . Number of children: N/A  . Years of education: N/A   Occupational History  . Not on file.   Social History Main Topics  . Smoking status: Never Smoker  . Smokeless tobacco: Never Used  . Alcohol use No  . Drug use: No  . Sexual activity: Yes    Birth control/ protection: None   Other Topics Concern  . Not on file   Social History Narrative  . No narrative on file   Family History  Problem Relation Age of Onset  . Arthritis Mother   . Depression Mother   . Diabetes Mother   . Hyperlipidemia Mother   . Arthritis Father   . Cancer Maternal Grandmother        breast  . Diabetes Paternal Grandmother   . Diabetes Paternal Grandfather   . Hearing loss Paternal Grandfather   . Cancer Maternal Aunt 50       Breast Cancer       Objective:    BP 124/79   Pulse 80  Temp 97.6 F (36.4 C) (Oral)   Resp 18   Ht 5' 8.11" (1.73 m)   Wt 277 lb (125.6 kg)   LMP 09/18/2016 (Approximate)   SpO2 98%   BMI 41.98 kg/m  Physical Exam  Constitutional: She is oriented to person, place, and time. She appears well-developed and well-nourished. No distress.  HENT:  Head: Normocephalic and atraumatic.  Right Ear: External ear normal.  Left Ear: External ear normal.  Nose: Nose normal.  Mouth/Throat: Oropharynx is clear and moist.  Eyes: Pupils are equal, round, and reactive to light. Conjunctivae and EOM are normal.  Neck: Normal range of motion. Neck supple. Carotid bruit is not present. No  thyromegaly present.  Cardiovascular: Normal rate, regular rhythm, normal heart sounds and intact distal pulses.  Exam reveals no gallop and no friction rub.   No murmur heard. Pulmonary/Chest: Effort normal and breath sounds normal. She has no wheezes. She has no rales.  Abdominal: Soft. Bowel sounds are normal. She exhibits no distension and no mass. There is no tenderness. There is no rebound and no guarding.  Genitourinary: Uterus normal. There is no rash, tenderness, lesion or injury on the right labia. There is no rash, tenderness, lesion or injury on the left labia. Cervix exhibits no motion tenderness, no discharge and no friability. Right adnexum displays no mass, no tenderness and no fullness. Left adnexum displays no mass, no tenderness and no fullness. No bleeding in the vagina. No foreign body in the vagina. Vaginal discharge found.  Lymphadenopathy:    She has no cervical adenopathy.  Neurological: She is alert and oriented to person, place, and time. No cranial nerve deficit.  Skin: Skin is warm and dry. No rash noted. She is not diaphoretic. No erythema. No pallor.  Psychiatric: She has a normal mood and affect. Her behavior is normal.        Assessment & Plan:   1. Dysuria   2. Type 2 diabetes mellitus without complication, without long-term current use of insulin ([place])   3. PCOS (polycystic ovarian syndrome)   4. Morbid obesity ([place])   5. Vaginal itching   6. Vaginal candidiasis    -new onset dysuria due to candidiasis vaginal; send urine culture; rx for Diflucan. -new onset DMII: refer to diabetic education; rx fro Metformin XR two daily provided. - I recommend weight loss, exercise, and low-carbohydrate low-sugar food choices. You should AVOID: regular sodas, sweetened tea, fruit juices.  You should LIMIT: breads, pastas, rice, potatoes, and desserts/sweets.  I would recommend limiting your total carbohydrate intake per meal to 45 grams; I would limit your total  carbohydrate intake per snack to 30 grams.  I would also have a goal of 60 grams of protein intake per day; this would equal 10-15 grams of protein per meal and 5-10 grams of protein per snack. -recommend weight loss, exercise for 30-60 minutes five days per week; recommend 1200 kcal restriction per day with a minimum of 60 grams of protein per day. -start PNV since not preventing pregnancy   Orders Placed This Encounter  Procedures  . Urine Culture  . Comprehensive metabolic panel  . Ambulatory referral to diabetic education    Referral Priority:   Routine    Referral Type:   Consultation    Referral Reason:   Specialty Services Required    Number of Visits Requested:   1  . POCT urinalysis dipstick  . POCT glucose (manual entry)  . POCT glycosylated hemoglobin (Hb A1C)  .  POCT Wet + KOH Prep   Meds ordered this encounter  Medications  . metFORMIN (GLUCOPHAGE-XR) 500 MG 24 hr tablet    Sig: Take 1 tablet (500 mg total) by mouth 2 (two) times daily.    Dispense:  180 tablet    Refill:  0  . fluconazole (DIFLUCAN) 150 MG tablet    Sig: Take 1 tablet repeat in 3 days    Dispense:  2 tablet    Refill:  0    Return in about 4 weeks (around 10/26/2016) for recheck DIABETES.   Nguyen Todorov Elayne Guerin, M.D. Primary Care at [facility] [address] [place], [place]  [address] [phone] phone [phone] fax

## 2016-09-29 LAB — COMPREHENSIVE METABOLIC PANEL
ALT: 17 IU/L (ref 0–32)
AST: 13 IU/L (ref 0–40)
Albumin/Globulin Ratio: 1 — ABNORMAL LOW (ref 1.2–2.2)
Albumin: 3.8 g/dL (ref 3.5–5.5)
Alkaline Phosphatase: 69 IU/L (ref 39–117)
BUN/Creatinine Ratio: 10 (ref 9–23)
BUN: 7 mg/dL (ref 6–20)
CHLORIDE: 102 mmol/L (ref 96–106)
CO2: 22 mmol/L (ref 20–29)
Calcium: 9.2 mg/dL (ref 8.7–10.2)
Creatinine, Ser: 0.73 mg/dL (ref 0.57–1.00)
GFR calc non Af Amer: 111 mL/min/{1.73_m2} (ref >59)
GFR, EST AFRICAN AMERICAN: 128 mL/min/{1.73_m2} (ref >59)
Globulin, Total: 3.7 g/dL (ref 1.5–4.5)
Glucose: 257 mg/dL — ABNORMAL HIGH (ref 65–99)
POTASSIUM: 4.1 mmol/L (ref 3.5–5.2)
Sodium: 133 mmol/L — ABNORMAL LOW (ref 134–144)
TOTAL PROTEIN: 7.5 g/dL (ref 6.0–8.5)

## 2016-09-29 LAB — URINE CULTURE

## 2016-10-14 DIAGNOSIS — E1165 Type 2 diabetes mellitus with hyperglycemia: Secondary | ICD-10-CM | POA: Insufficient documentation

## 2016-10-14 DIAGNOSIS — E119 Type 2 diabetes mellitus without complications: Secondary | ICD-10-CM

## 2016-10-14 DIAGNOSIS — E1169 Type 2 diabetes mellitus with other specified complication: Secondary | ICD-10-CM | POA: Insufficient documentation

## 2016-10-31 ENCOUNTER — Ambulatory Visit: Payer: 59 | Admitting: Family Medicine

## 2017-04-24 MED FILL — DORZOLAMIDE-TIMOLOL EYE DRP: 22.3-6.8 | 90 days supply | Qty: 10 | Fill #0

## 2017-04-24 MED FILL — BRIMONIDINE 0.2% EYE DROP: 0.2 | 90 days supply | Qty: 10 | Fill #0

## 2017-05-09 ENCOUNTER — Telehealth: Payer: 59 | Admitting: Family

## 2017-05-09 DIAGNOSIS — B373 Candidiasis of vulva and vagina: Secondary | ICD-10-CM | POA: Diagnosis not present

## 2017-05-09 DIAGNOSIS — B3731 Acute candidiasis of vulva and vagina: Secondary | ICD-10-CM

## 2017-05-09 MED ORDER — FLUCONAZOLE 150 MG PO TABS: 150.0000 mg | ORAL_TABLET | Freq: Once | ORAL | 0 refills | Status: AC

## 2017-05-09 MED FILL — FLUCONAZOLE 150 MG TABLET: 150 | 1 days supply | Qty: 1 | Fill #0

## 2017-05-09 NOTE — Progress Notes (Signed)

## 2017-06-12 ENCOUNTER — Other Ambulatory Visit: Payer: Self-pay

## 2017-06-12 ENCOUNTER — Encounter: Payer: Self-pay | Admitting: Physician Assistant

## 2017-06-12 ENCOUNTER — Ambulatory Visit: Payer: 59 | Admitting: Physician Assistant

## 2017-06-12 VITALS — BP 139/84 | HR 80 | Temp 97.8°F | Resp 16 | Ht 67.0 in | Wt 278.0 lb

## 2017-06-12 DIAGNOSIS — E1165 Type 2 diabetes mellitus with hyperglycemia: Secondary | ICD-10-CM

## 2017-06-12 DIAGNOSIS — Z13220 Encounter for screening for lipoid disorders: Secondary | ICD-10-CM | POA: Diagnosis not present

## 2017-06-12 DIAGNOSIS — E119 Type 2 diabetes mellitus without complications: Secondary | ICD-10-CM | POA: Diagnosis not present

## 2017-06-12 LAB — POCT URINALYSIS DIP (MANUAL ENTRY)
Bilirubin, UA: NEGATIVE
Glucose, UA: NEGATIVE mg/dL
Ketones, POC UA: NEGATIVE mg/dL
Nitrite, UA: NEGATIVE
Protein Ur, POC: NEGATIVE mg/dL
Spec Grav, UA: 1.02 (ref 1.010–1.025)
Urobilinogen, UA: 0.2 U/dL
pH, UA: 6.5 (ref 5.0–8.0)

## 2017-06-12 LAB — LIPID PANEL
Chol/HDL Ratio: 4.6 ratio — ABNORMAL HIGH (ref 0.0–4.4)
Cholesterol, Total: 204 mg/dL — ABNORMAL HIGH (ref 100–199)
HDL: 44 mg/dL (ref >39)
LDL Calculated: 141 mg/dL — ABNORMAL HIGH (ref 0–99)
Triglycerides: 95 mg/dL (ref 0–149)
VLDL Cholesterol Cal: 19 mg/dL (ref 5–40)

## 2017-06-12 LAB — POCT GLYCOSYLATED HEMOGLOBIN (HGB A1C): Hemoglobin A1C: 11.6

## 2017-06-12 LAB — GLUCOSE, POCT (MANUAL RESULT ENTRY): POC Glucose: 200 mg/dl — AB (ref 70–99)

## 2017-06-12 MED ORDER — GLIMEPIRIDE 2 MG PO TABS: 2.0000 mg | ORAL_TABLET | Freq: Every day | ORAL | 3 refills | Status: DC

## 2017-06-12 MED FILL — GLIMEPIRIDE 2 MG TABLET: 2 | 30 days supply | Qty: 30 | Fill #0

## 2017-06-12 NOTE — Progress Notes (Signed)
 Maria Maddox  MRN: [id number] DOB: 02/09/1986  PCP: Taft, Kawanta F, MD  Subjective:  Pt is a 31 year old female PMH glaucoma, DM, obesity, who presents to clinic for recheck DM.   She had never been told she was prediabetic by previous PCP. Dx with DM 09/2016 after OV for yeast infection.  She is fasting this morning.   She had one episode 4 days ago of feeling whoozy and dizzy. Her bld sugar at that time was 400. "I haven't been doing was I'm suppose to." Bld sugars running in the 200's at home. Metformin and Metformin XR is causing loose stool and nausea. She has not taken DM medication in > 1 month. Last OV for this problem 09/2016 She is feeling extra drained, is peeing all the time, thirsty. She wakes up 2-3 x/night to urinate.  Denies abdominal pain.  She works as a CNA.   She is not eating a DM friendly diet.  She has been drinking water, has cut out sodas and juice.  She walking 2 miles every now and then.   Review of Systems  Constitutional: Positive for fatigue. Negative for diaphoresis.  Gastrointestinal: Positive for diarrhea. Negative for abdominal pain, nausea and vomiting.  Endocrine: Positive for polydipsia and polyuria. Negative for polyphagia.  Neurological: Positive for headaches. Negative for dizziness.    Patient Active Problem List   Diagnosis Date Noted  . Type 2 diabetes mellitus without complication, without long-term current use of insulin (HCC) 10/14/2016  . Boil, axilla 07/11/2014  . Bursitis, hip 08/21/2013  . Morbid obesity (HCC) 01/15/2013  . PCOS (polycystic ovarian syndrome) 01/15/2013    Current Outpatient Medications on File Prior to Visit  Medication Sig Dispense Refill  . brimonidine (ALPHAGAN P) 0.1 % SOLN Place 1 drop into both eyes 2 (two) times daily.    . dorzolamide-timolol (COSOPT) 22.3-6.8 MG/ML ophthalmic solution Place 1 drop into the right eye daily.    . metFORMIN (GLUCOPHAGE-XR) 500 MG 24 hr tablet Take 1 tablet (500 mg  total) by mouth 2 (two) times daily. 180 tablet 0   No current facility-administered medications on file prior to visit.     No Known Allergies   Objective:  BP 139/84   Pulse 80   Temp 97.8 F (36.6 C) (Oral)   Resp 16   Ht 5' 7" (1.702 m)   Wt 278 lb (126.1 kg)   LMP 05/25/2017   SpO2 100%   BMI 43.54 kg/m  Diabetic Foot Exam - Simple   Simple Foot Form Diabetic Foot exam was performed with the following findings:  Yes 06/12/2017  8:45 AM  Visual Inspection No deformities, no ulcerations, no other skin breakdown bilaterally:  Yes Sensation Testing Intact to touch and monofilament testing bilaterally:  Yes Pulse Check Posterior Tibialis and Dorsalis pulse intact bilaterally:  Yes Comments     Physical Exam  Constitutional: She is oriented to person, place, and time and well-developed, well-nourished, and in no distress. No distress.  Cardiovascular: Normal rate, regular rhythm and normal heart sounds.  Abdominal:  obese  Neurological: She is alert and oriented to person, place, and time. GCS score is 15.  Skin: Skin is warm and dry.  Psychiatric: Mood, memory, affect and judgment normal.  Vitals reviewed.  Results for orders placed or performed in visit on 06/12/17  POCT glycosylated hemoglobin (Hb A1C)  Result Value Ref Range   Hemoglobin A1C 11.6   POCT glucose (manual entry)  Result Value Ref   Range   POC Glucose 200 (A) 70 - 99 mg/dl  POCT urinalysis dipstick  Result Value Ref Range   Color, UA yellow yellow   Clarity, UA clear clear   Glucose, UA negative negative mg/dL   Bilirubin, UA negative negative   Ketones, POC UA negative negative mg/dL   Spec Grav, UA 1.020 1.010 - 1.025   Blood, UA large (A) negative   pH, UA 6.5 5.0 - 8.0   Protein Ur, POC negative negative mg/dL   Urobilinogen, UA 0.2 0.2 or 1.0 E.U./dL   Nitrite, UA Negative Negative   Leukocytes, UA Small (1+) (A) Negative    Assessment and Plan :  1. Uncontrolled type 2 diabetes  mellitus with hyperglycemia (HCC) - HM Diabetes Foot Exam - Microalbumin, urine - POCT glycosylated hemoglobin (Hb A1C) - CMP14+EGFR - POCT glucose (manual entry) - POCT urinalysis dipstick - Ambulatory referral to diabetic education - glimepiride (AMARYL) 2 MG tablet; Take 1 tablet (2 mg total) by mouth daily before breakfast.  Dispense: 30 tablet; Refill: 3 - Pt presents for DM check following symptoms of feeling poorly a few days ago. Today A1C is 11.6, sugar is 200, no ketones or sugar in her urine.  She has not been taking DM medications in > 1 month. C/o diarrhea and abdominal pain with Metformin and Meformin XR. Will try Amaryl today. RTC in 3-4 weeks for recheck. She plans to sched appt with ophthalmology soon. Referral placed for DM nutrition, as she never went last time. Encouraged improved diet and increased exercise.  2. Morbid obesity (HCC) 3. Screening, lipid - Lipid panel   Whitney McVey, PA-C  Primary Care at [facility] 06/12/2017 8:48 AM  

## 2017-06-12 NOTE — Patient Instructions (Addendum)
Your hemoglobin A1C is 11.6 today. This is way too high and likely causing your symptoms.  Work hard on improving your diet and increasing the amount you exercise. This is a key component in the management of diabetes.  Start taking Glimepiride 2 mg with breakfast.  Come back and see me in 3-4 weeks for recheck.  You will receive a phone call to schedule an appointment with diabetic nutritionist.   Diabetes Mellitus and Nutrition When you have diabetes (diabetes mellitus), it is very important to have healthy eating habits because your blood sugar (glucose) levels are greatly affected by what you eat and drink. Eating healthy foods in the appropriate amounts, at about the same times every day, can help you:  Control your blood glucose.  Lower your risk of heart disease.  Improve your blood pressure.  Reach or maintain a healthy weight.  Every person with diabetes is different, and each person has different needs for a meal plan. Your health care provider may recommend that you work with a diet and nutrition specialist (dietitian) to make a meal plan that is best for you. Your meal plan may vary depending on factors such as:  The calories you need.  The medicines you take.  Your weight.  Your blood glucose, blood pressure, and cholesterol levels.  Your activity level.  Other health conditions you have, such as heart or kidney disease.  How do carbohydrates affect me? Carbohydrates affect your blood glucose level more than any other type of food. Eating carbohydrates naturally increases the amount of glucose in your blood. Carbohydrate counting is a method for keeping track of how many carbohydrates you eat. Counting carbohydrates is important to keep your blood glucose at a healthy level, especially if you use insulin or take certain oral diabetes medicines. It is important to know how many carbohydrates you can safely have in each meal. This is different for every person. Your  dietitian can help you calculate how many carbohydrates you should have at each meal and for snack. Foods that contain carbohydrates include:  Bread, cereal, rice, pasta, and crackers.  Potatoes and corn.  Peas, beans, and lentils.  Milk and yogurt.  Fruit and juice.  Desserts, such as cakes, cookies, ice cream, and candy.  How does alcohol affect me? Alcohol can cause a sudden decrease in blood glucose (hypoglycemia), especially if you use insulin or take certain oral diabetes medicines. Hypoglycemia can be a life-threatening condition. Symptoms of hypoglycemia (sleepiness, dizziness, and confusion) are similar to symptoms of having too much alcohol. If your health care provider says that alcohol is safe for you, follow these guidelines:  Limit alcohol intake to no more than 1 drink per day for nonpregnant women and 2 drinks per day for men. One drink equals 12 oz of beer, 5 oz of wine, or 1 oz of hard liquor.  Do not drink on an empty stomach.  Keep yourself hydrated with water, diet soda, or unsweetened iced tea.  Keep in mind that regular soda, juice, and other mixers may contain a lot of sugar and must be counted as carbohydrates.  What are tips for following this plan? Reading food labels  Start by checking the serving size on the label. The amount of calories, carbohydrates, fats, and other nutrients listed on the label are based on one serving of the food. Many foods contain more than one serving per package.  Check the total grams (g) of carbohydrates in one serving. You can calculate the number  of servings of carbohydrates in one serving by dividing the total carbohydrates by 15. For example, if a food has 30 g of total carbohydrates, it would be equal to 2 servings of carbohydrates.  Check the number of grams (g) of saturated and trans fats in one serving. Choose foods that have low or no amount of these fats.  Check the number of milligrams (mg) of sodium in one  serving. Most people should limit total sodium intake to less than 2,300 mg per day.  Always check the nutrition information of foods labeled as "low-fat" or "nonfat". These foods may be higher in added sugar or refined carbohydrates and should be avoided.  Talk to your dietitian to identify your daily goals for nutrients listed on the label. Shopping  Avoid buying canned, premade, or processed foods. These foods tend to be high in fat, sodium, and added sugar.  Shop around the outside edge of the grocery store. This includes fresh fruits and vegetables, bulk grains, fresh meats, and fresh dairy. Cooking  Use low-heat cooking methods, such as baking, instead of high-heat cooking methods like deep frying.  Cook using healthy oils, such as olive, canola, or sunflower oil.  Avoid cooking with butter, cream, or high-fat meats. Meal planning  Eat meals and snacks regularly, preferably at the same times every day. Avoid going long periods of time without eating.  Eat foods high in fiber, such as fresh fruits, vegetables, beans, and whole grains. Talk to your dietitian about how many servings of carbohydrates you can eat at each meal.  Eat 4-6 ounces of lean protein each day, such as lean meat, chicken, fish, eggs, or tofu. 1 ounce is equal to 1 ounce of meat, chicken, or fish, 1 egg, or 1/4 cup of tofu.  Eat some foods each day that contain healthy fats, such as avocado, nuts, seeds, and fish. Lifestyle   Check your blood glucose regularly.  Exercise at least 30 minutes 5 or more days each week, or as told by your health care provider.  Take medicines as told by your health care provider.  Do not use any products that contain nicotine or tobacco, such as cigarettes and e-cigarettes. If you need help quitting, ask your health care provider.  Work with a Social worker or diabetes educator to identify strategies to manage stress and any emotional and social challenges. What are some  questions to ask my health care provider?  Do I need to meet with a diabetes educator?  Do I need to meet with a dietitian?  What number can I call if I have questions?  When are the best times to check my blood glucose? Where to find more information:  American Diabetes Association: diabetes.org/food-and-fitness/food  Academy of Nutrition and Dietetics: PokerClues.dk  [employer] of Diabetes and Digestive and Kidney Diseases (NIH): ContactWire.be Summary  A healthy meal plan will help you control your blood glucose and maintain a healthy lifestyle.  Working with a diet and nutrition specialist (dietitian) can help you make a meal plan that is best for you.  Keep in mind that carbohydrates and alcohol have immediate effects on your blood glucose levels. It is important to count carbohydrates and to use alcohol carefully. This information is not intended to replace advice given to you by your health care provider. Make sure you discuss any questions you have with your health care provider. Document Released: 11/30/2004 Document Revised: 04/09/2016 Document Reviewed: 04/09/2016 Elsevier Interactive Patient Education  2018 [employer].  IF you  received an x-ray today, you will receive an invoice from [facility]. Please contact [facility] at [phone] with questions or concerns regarding your invoice.   IF you received labwork today, you will receive an invoice from [facility]. Please contact [facility] at [phone] with questions or concerns regarding your invoice.   Our billing staff will not be able to assist you with questions regarding bills from these companies.  You will be contacted with the lab results as soon as they are available. The fastest way to get your results is to activate your My Chart account. Instructions are  located on the last page of this paperwork. If you have not heard from [place] regarding the results in 2 weeks, please contact this office.

## 2017-06-13 LAB — CMP14+EGFR
ALT: 16 IU/L (ref 0–32)
AST: 13 IU/L (ref 0–40)
Albumin/Globulin Ratio: 0.9 — ABNORMAL LOW (ref 1.2–2.2)
Albumin: 3.7 g/dL (ref 3.5–5.5)
Alkaline Phosphatase: 69 IU/L (ref 39–117)
BUN/Creatinine Ratio: 15 (ref 9–23)
BUN: 11 mg/dL (ref 6–20)
Bilirubin Total: 0.3 mg/dL (ref 0.0–1.2)
CO2: 21 mmol/L (ref 20–29)
Calcium: 9.3 mg/dL (ref 8.7–10.2)
Chloride: 101 mmol/L (ref 96–106)
Creatinine, Ser: 0.72 mg/dL (ref 0.57–1.00)
GFR calc Af Amer: 129 mL/min/{1.73_m2} (ref >59)
GFR calc non Af Amer: 112 mL/min/{1.73_m2} (ref >59)
Globulin, Total: 4.3 g/dL (ref 1.5–4.5)
Glucose: 202 mg/dL — ABNORMAL HIGH (ref 65–99)
Potassium: 4 mmol/L (ref 3.5–5.2)
Sodium: 135 mmol/L (ref 134–144)
Total Protein: 8 g/dL (ref 6.0–8.5)

## 2017-06-13 LAB — MICROALBUMIN, URINE: Microalbumin, Urine: 36.9 ug/mL

## 2017-06-20 ENCOUNTER — Encounter: Payer: Self-pay | Admitting: Physician Assistant

## 2017-06-21 ENCOUNTER — Other Ambulatory Visit: Payer: Self-pay | Admitting: Physician Assistant

## 2017-06-21 DIAGNOSIS — E119 Type 2 diabetes mellitus without complications: Secondary | ICD-10-CM

## 2017-06-21 MED ORDER — METFORMIN HCL ER 500 MG PO TB24: ORAL_TABLET | ORAL | 2 refills | Status: DC

## 2017-06-24 MED FILL — METFORMIN HCL ER 500 MG TAB: 500 | 45 days supply | Qty: 180 | Fill #0

## 2017-07-16 ENCOUNTER — Ambulatory Visit: Payer: 59 | Admitting: Physician Assistant

## 2017-07-24 ENCOUNTER — Ambulatory Visit: Payer: 59 | Admitting: Physician Assistant

## 2017-07-24 ENCOUNTER — Other Ambulatory Visit: Payer: Self-pay

## 2017-07-24 ENCOUNTER — Encounter: Payer: Self-pay | Admitting: Physician Assistant

## 2017-07-24 VITALS — BP 112/80 | HR 86 | Temp 97.6°F | Resp 16 | Ht 67.0 in | Wt 269.2 lb

## 2017-07-24 DIAGNOSIS — E119 Type 2 diabetes mellitus without complications: Secondary | ICD-10-CM

## 2017-07-24 LAB — POCT URINALYSIS DIP (MANUAL ENTRY)
Bilirubin, UA: NEGATIVE
Glucose, UA: NEGATIVE mg/dL
Ketones, POC UA: NEGATIVE mg/dL
Leukocytes, UA: NEGATIVE
Nitrite, UA: NEGATIVE
Protein Ur, POC: NEGATIVE mg/dL
Spec Grav, UA: 1.025 (ref 1.010–1.025)
Urobilinogen, UA: 0.2 E.U./dL
pH, UA: 5.5 (ref 5.0–8.0)

## 2017-07-24 LAB — POCT GLYCOSYLATED HEMOGLOBIN (HGB A1C): Hemoglobin A1C: 9.6

## 2017-07-24 NOTE — Progress Notes (Signed)
Maria Maddox  MRN: [id number] DOB: Jul 18, 1985  PCP: Maria Rossetti, MD  Subjective:  Pt is a pleasant 32 year old female PMH DM, obesity, PCOS, glaucoma who presents to clinic for DM check.  She is feeling great today. She is taking Metformin XR 500mg  bid. No medication side effects.  She alternates between taking it bid and qd - she takes this bc of the way she feels. "sometimes sugars gets too low". Low readings taken when she feels light headed are in the 90's.  She checks home sugars about twice/day. Usually between 92-130.  Diet - she has made great improvements with cutting out sugars, limiting greasy foods, eating more vegetables.  "I really wanted a cheeseburger the other day but my husband told me not to".   Exercise - treadmill. She got a fitbit and is watching her steps. She has lost about 10 lbs since her last OV.  DM nutritionist - not yet. Does not want this as she would like to continue working at this on her own.  Ophthalmology - has not been yet. She has eye doctor at [facility] for her glaucoma. Plans to go.   Review of Systems  Constitutional: Negative for chills, diaphoresis and fatigue.  Gastrointestinal: Negative for abdominal pain, diarrhea and vomiting.  Endocrine: Negative for polydipsia, polyphagia and polyuria.  Neurological: Positive for light-headedness. Negative for dizziness and headaches.    Patient Active Problem List   Diagnosis Date Noted  . Type 2 diabetes mellitus without complication, without long-term current use of insulin ([place]) 10/14/2016  . Boil, axilla 07/11/2014  . Bursitis, hip 08/21/2013  . Morbid obesity ([place]) 01/15/2013  . PCOS (polycystic ovarian syndrome) 01/15/2013    Current Outpatient Medications on File Prior to Visit  Medication Sig Dispense Refill  . brimonidine (ALPHAGAN P) 0.1 % SOLN Place 1 drop into both eyes 2 (two) times daily.    . dorzolamide-timolol (COSOPT) 22.3-6.8 MG/ML ophthalmic solution Place 1 drop into  the right eye daily.    . metFORMIN (GLUCOPHAGE-XR) 500 MG 24 hr tablet Week 1: take 1/2 tablet twice a day. Week 2: take 1 tablet in the morning, 1/2 tablet at night. Week 3: take 2 tablets twice a day. 180 tablet 2  . glimepiride (AMARYL) 2 MG tablet Take 1 tablet (2 mg total) by mouth daily before breakfast. (Patient not taking: Reported on 07/24/2017) 30 tablet 3   No current facility-administered medications on file prior to visit.     No Known Allergies   Objective:  BP 112/80 (BP Location: Left Arm, Patient Position: Sitting, Cuff Size: Large)   Pulse 86   Temp 97.6 F (36.4 C) (Oral)   Resp 16   Ht 5\' 7"  (1.702 m)   Wt 269 lb 3.2 oz (122.1 kg)   LMP 07/19/2017   SpO2 99%   BMI 42.16 kg/m   Physical Exam  Constitutional: She is oriented to person, place, and time. No distress.  Cardiovascular: Normal rate, regular rhythm and normal heart sounds.  Neurological: She is alert and oriented to person, place, and time.  Skin: Skin is warm and dry.  Psychiatric: Judgment normal.  Vitals reviewed.   Lab Results  Component Value Date   HGBA1C 11.6 06/12/2017   Results for orders placed or performed in visit on 07/24/17  POCT glycosylated hemoglobin (Hb A1C)  Result Value Ref Range   Hemoglobin A1C 9.6   POCT urinalysis dipstick  Result Value Ref Range   Color, UA  yellow yellow   Clarity, UA clear clear   Glucose, UA negative negative mg/dL   Bilirubin, UA negative negative   Ketones, POC UA negative negative mg/dL   Spec Grav, UA 1.025 1.010 - 1.025   Blood, UA moderate (A) negative   pH, UA 5.5 5.0 - 8.0   Protein Ur, POC negative negative mg/dL   Urobilinogen, UA 0.2 0.2 or 1.0 E.U./dL   Nitrite, UA Negative Negative   Leukocytes, UA Negative Negative    Assessment and Plan :  1. Type 2 diabetes mellitus without complication, without long-term current use of insulin (HCC) - POCT glycosylated hemoglobin (Hb A1C) - POCT urinalysis dipstick - pt is doing great.  She has made many lifestyle changes and has lost 10lbs in <2 months. A1C improved from 11.6 to 9.6. Advise pt con't checking daily sugars and living healthy lifestyle. Con't Metformin and RTC in 3 months for recheck. Plan to recheck lipids at that visit.   Mercer Pod, PA-C  Primary Care at [facility] 07/24/2017 8:18 AM

## 2017-07-24 NOTE — Patient Instructions (Addendum)
Great news! Your A1C is 9.6. (this is down from 11.6 two months ago) Keep up the great work! (see below for general info about the A1C)  If you need any tips about nutrition/diet/exercise [email or web address] is a Microbiologist.   Make an appointment with [facility] for a diabetes eye exams.   Come back and see me in 3 months for recheck.   Hemoglobin A1c Test Some of the sugar (glucose) that circulates in your blood sticks or binds to blood proteins. Hemoglobin (Hb or Hgb) is one type of blood protein that glucose binds to. It also carries oxygen in the red blood cells (RBCs). When glucose binds to Hb, the glucose-coated Hb is called glycated Hb. Once Hb is glycated, it remains that way for the life of the RBC. This is about 120 days. Rather than testing your blood glucose level on one single day, the hemoglobin A1c (HbA1c) test measures the average amount of glycated hemoglobin and, therefore, the average amount of glucose in your blood during the 3-4 months just before the test is done. The HbA1c test is used to monitor long-term control of blood sugar in people who have diabetes mellitus. The HbA1c test can also be used in addition to or in combination with fasting blood glucose level and oral glucose tolerance tests. Range of Normal Values Ranges for normal values may vary among different labs and hospitals. You should always check with your health care provider after having lab work or other tests done to discuss the meaning of your test results and whether your values are considered within normal limits. The ranges for normal HbA1c test results are as follows:  Adult or child without diabetes: 4-5.9%.  Adult or child with diabetes and good blood glucose control: less than 6.5%.  Meaning of Results Outside Normal Value Ranges Abnormally high HbA1c values are most commonly an indication of prediabetes mellitus and diabetes mellitus:  An HbA1c result of 5.7-6.4% is considered  diagnostic of prediabetes mellitus.  An HbA1c result of 6.5% or higher on two separate occasions is considered diagnostic of diabetes mellitus.   Diabetes Mellitus and Exercise Exercising regularly is important for your overall health, especially when you have diabetes (diabetes mellitus). Exercising is not only about losing weight. It has many health benefits, such as increasing muscle strength and bone density and reducing body fat and stress. This leads to improved fitness, flexibility, and endurance, all of which result in better overall health. Exercise has additional benefits for people with diabetes, including:  Reducing appetite.  Helping to lower and control blood glucose.  Lowering blood pressure.  Helping to control amounts of fatty substances (lipids) in the blood, such as cholesterol and triglycerides.  Helping the body to respond better to insulin (improving insulin sensitivity).  Reducing how much insulin the body needs.  Decreasing the risk for heart disease by: ? Lowering cholesterol and triglyceride levels. ? Increasing the levels of good cholesterol. ? Lowering blood glucose levels.  What is my activity plan? Your health care provider or certified diabetes educator can help you make a plan for the type and frequency of exercise (activity plan) that works for you. Make sure that you:  Do at least 150 minutes of moderate-intensity or vigorous-intensity exercise each week. This could be brisk walking, biking, or water aerobics. ? Do stretching and strength exercises, such as yoga or weightlifting, at least 2 times a week. ? Spread out your activity over at least 3 days of the week.  Get some form of physical activity every day. ? Do not go more than 2 days in a row without some kind of physical activity. ? Avoid being inactive for more than 90 minutes at a time. Take frequent breaks to walk or stretch.  Choose a type of exercise or activity that you enjoy, and set  realistic goals.  Start slowly, and gradually increase the intensity of your exercise over time.  What do I need to know about managing my diabetes?  Check your blood glucose before and after exercising. ? If your blood glucose is higher than 240 mg/dL (13.3 mmol/L) before you exercise, check your urine for ketones. If you have ketones in your urine, do not exercise until your blood glucose returns to normal.  Know the symptoms of low blood glucose (hypoglycemia) and how to treat it. Your risk for hypoglycemia increases during and after exercise. Common symptoms of hypoglycemia can include: ? Hunger. ? Anxiety. ? Sweating and feeling clammy. ? Confusion. ? Dizziness or feeling light-headed. ? Increased heart rate or palpitations. ? Blurry vision. ? Tingling or numbness around the mouth, lips, or tongue. ? Tremors or shakes. ? Irritability.  Keep a rapid-acting carbohydrate snack available before, during, and after exercise to help prevent or treat hypoglycemia.  Avoid injecting insulin into areas of the body that are going to be exercised. For example, avoid injecting insulin into: ? The arms, when playing tennis. ? The legs, when jogging.  Keep records of your exercise habits. Doing this can help you and your health care provider adjust your diabetes management plan as needed. Write down: ? Food that you eat before and after you exercise. ? Blood glucose levels before and after you exercise. ? The type and amount of exercise you have done. ? When your insulin is expected to peak, if you use insulin. Avoid exercising at times when your insulin is peaking.  When you start a new exercise or activity, work with your health care provider to make sure the activity is safe for you, and to adjust your insulin, medicines, or food intake as needed.  Drink plenty of water while you exercise to prevent dehydration or heat stroke. Drink enough fluid to keep your urine clear or pale  yellow. This information is not intended to replace advice given to you by your health care provider. Make sure you discuss any questions you have with your health care provider. Document Released: 05/26/2003 Document Revised: 09/23/2015 Document Reviewed: 08/15/2015 Elsevier Interactive Patient Education  [employer].  Thank you for coming in today. I hope you feel we met your needs.  Feel free to call PCP if you have any questions or further requests.  Please consider signing up for MyChart if you do not already have it, as this is a great way to communicate with me.  Best,  Whitney McVey, PA-C  IF you received an x-ray today, you will receive an invoice from [facility]. Please contact [facility] at [phone] with questions or concerns regarding your invoice.   IF you received labwork today, you will receive an invoice from [facility]. Please contact [facility] at [phone] with questions or concerns regarding your invoice.   Our billing staff will not be able to assist you with questions regarding bills from these companies.  You will be contacted with the lab results as soon as they are available. The fastest way to get your results is to activate your My Chart account. Instructions are located on the last  page of this paperwork. If you have not heard from [place] regarding the results in 2 weeks, please contact this office.

## 2017-08-02 ENCOUNTER — Encounter: Payer: Self-pay | Admitting: Family Medicine

## 2017-08-07 ENCOUNTER — Encounter: Payer: Self-pay | Admitting: Family Medicine

## 2017-10-23 ENCOUNTER — Ambulatory Visit: Payer: 59 | Admitting: Physician Assistant

## 2017-11-22 MED FILL — METFORMIN HCL ER 500 MG TAB: 500 | 90 days supply | Qty: 90 | Fill #1

## 2017-12-02 ENCOUNTER — Encounter: Payer: 59 | Admitting: Physician Assistant

## 2018-02-11 MED FILL — metFORMIN HCL ER 500 MG TB2: 500 | 90 days supply | Qty: 90 | Fill #2

## 2018-04-11 ENCOUNTER — Ambulatory Visit: Payer: 59 | Admitting: Family Medicine

## 2018-10-22 DIAGNOSIS — E282 Polycystic ovarian syndrome: Secondary | ICD-10-CM | POA: Diagnosis not present

## 2018-10-22 DIAGNOSIS — N920 Excessive and frequent menstruation with regular cycle: Secondary | ICD-10-CM | POA: Diagnosis not present

## 2018-10-22 DIAGNOSIS — N926 Irregular menstruation, unspecified: Secondary | ICD-10-CM | POA: Diagnosis not present

## 2018-10-27 ENCOUNTER — Encounter: Payer: Self-pay | Admitting: Emergency Medicine

## 2018-10-27 ENCOUNTER — Ambulatory Visit: Payer: 59 | Admitting: Emergency Medicine

## 2018-10-27 ENCOUNTER — Other Ambulatory Visit: Payer: Self-pay

## 2018-10-27 VITALS — BP 121/70 | HR 90 | Temp 98.4°F | Resp 16 | Ht 67.0 in | Wt 276.8 lb

## 2018-10-27 DIAGNOSIS — Z8742 Personal history of other diseases of the female genital tract: Secondary | ICD-10-CM

## 2018-10-27 DIAGNOSIS — E282 Polycystic ovarian syndrome: Secondary | ICD-10-CM | POA: Diagnosis not present

## 2018-10-27 DIAGNOSIS — E1165 Type 2 diabetes mellitus with hyperglycemia: Secondary | ICD-10-CM

## 2018-10-27 DIAGNOSIS — E119 Type 2 diabetes mellitus without complications: Secondary | ICD-10-CM | POA: Diagnosis not present

## 2018-10-27 LAB — POCT GLYCOSYLATED HEMOGLOBIN (HGB A1C): Hemoglobin A1C: 9.2 % — AB (ref 4.0–5.6)

## 2018-10-27 LAB — GLUCOSE, POCT (MANUAL RESULT ENTRY): POC Glucose: 128 mg/dl — AB (ref 70–99)

## 2018-10-27 MED ORDER — METFORMIN HCL 500 MG PO TABS: 500.0000 mg | ORAL_TABLET | Freq: Two times a day (BID) | ORAL | 3 refills | Status: DC

## 2018-10-27 MED ORDER — OZEMPIC (0.25 OR 0.5 MG/DOSE) 2 MG/1.5ML ~~LOC~~ SOPN: 0.2500 mg | PEN_INJECTOR | SUBCUTANEOUS | 5 refills | Status: DC

## 2018-10-27 NOTE — Progress Notes (Addendum)
Lab Results  Component Value Date   HGBA1C 9.6 07/24/2017   Maria Maddox 33 y.o.   Chief Complaint  Patient presents with   Diabetes   Medication Refill    Metformin SR    HISTORY OF PRESENT ILLNESS: This is a 33 y.o. female with history of diabetes here to establish care with me.  Used to see PA McVeigh.  Has a history of diabetes taking metformin SR twice a day.  At home glucose have been high. Also has a history of PCOS and history of heavy periods.  Sees GYN regularly. 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 No other significant history.  No complaints or medical concerns today.  HPI   Prior to Admission medications   Medication Sig Start Date End Date Taking? Authorizing Provider  brimonidine (ALPHAGAN P) 0.1 % SOLN Place 1 drop into both eyes 2 (two) times daily.   Yes [provider]  dorzolamide-timolol (COSOPT) 22.3-6.8 MG/ML ophthalmic solution Place 1 drop into the right eye daily.   Yes [provider]  medroxyPROGESTERone (PROVERA) 10 MG tablet Take 10 mg by mouth daily.   Yes [provider]  metFORMIN (GLUCOPHAGE-XR) 500 MG 24 hr tablet Week 1: take 1/2 tablet twice a day. Week 2: take 1 tablet in the morning, 1/2 tablet at night. Week 3: take 2 tablets twice a day. 06/21/17  Yes McVey, Gelene Mink, PA-C    No Known Allergies  Patient Active Problem List   Diagnosis Date Noted   Type 2 diabetes mellitus without complication, without long-term current use of insulin ([place]) 10/14/2016   Morbid obesity ([place]) 01/15/2013   PCOS (polycystic ovarian syndrome) 01/15/2013    Past Medical History:  Diagnosis Date   Eczema    Glaucoma    has had for 10 yrears   Legally blind in right eye, as defined in [place]    PCOS (polycystic ovarian syndrome)     Past Surgical History:  Procedure Laterality Date   EYE SURGERY     right side, numerous surgeries (now blind)   PILONIDAL CYST  EXCISION N/A 10/26/2015   Procedure: CYST EXCISION PILONIDAL;  Surgeon: Coralie Keens, MD;  Location: [facility];  Service: General;  Laterality: N/A;    Social History   Socioeconomic History   Marital status: Married    Spouse name: Not on file   Number of children: Not on file   Years of education: Not on file   Highest education level: Not on file  Occupational History   Not on file  Social Needs   Financial resource strain: Not on file   Food insecurity    Worry: Not on file    Inability: Not on file   Transportation needs    Medical: Not on file    Non-medical: Not on file  Tobacco Use   Smoking status: Never Smoker   Smokeless tobacco: Never Used  Substance and Sexual Activity   Alcohol use: No   Drug use: No   Sexual activity: Yes    Birth control/protection: None  Lifestyle   Physical activity    Days per week: Not on file    Minutes per session: Not on file   Stress: Not on file  Relationships   Social connections    Talks on phone: Not on file    Gets together: Not on file    Attends religious service: Not on file  Active member of club or organization: Not on file    Attends meetings of clubs or organizations: Not on file    Relationship status: Not on file   Intimate partner violence    Fear of current or ex partner: Not on file    Emotionally abused: Not on file    Physically abused: Not on file    Forced sexual activity: Not on file  Other Topics Concern   Not on file  Social History Narrative   Not on file    Family History  Problem Relation Age of Onset   Arthritis Mother    Depression Mother    Diabetes Mother    Hyperlipidemia Mother    Arthritis Father    Cancer Maternal Grandmother        breast   Diabetes Paternal Grandmother    Diabetes Paternal Grandfather    Hearing loss Paternal Grandfather    Cancer Maternal Aunt 70       Breast Cancer     Review of Systems    Constitutional: Negative.  Negative for chills and fever.  HENT: Negative.  Negative for congestion and sore throat.   Eyes: Negative.   Respiratory: Negative.  Negative for cough and shortness of breath.   Cardiovascular: Negative.  Negative for chest pain and palpitations.  Gastrointestinal: Negative.  Negative for abdominal pain, diarrhea, nausea and vomiting.  Genitourinary: Negative.  Negative for dysuria.       Heavy menstrual periods  Musculoskeletal: Negative.  Negative for myalgias and neck pain.  Skin: Negative.  Negative for rash.  Neurological: Negative.  Negative for dizziness and headaches.  Endo/Heme/Allergies: Negative.   All other systems reviewed and are negative.  Vitals:   10/27/18 1108  BP: 121/70  Pulse: 90  Resp: 16  Temp: 98.4 F (36.9 C)  SpO2: 98%     Physical Exam Vitals signs reviewed.  Constitutional:      Appearance: Normal appearance. She is obese.  HENT:     Head: Normocephalic and atraumatic.  Eyes:     Extraocular Movements: Extraocular movements intact.     Conjunctiva/sclera: Conjunctivae normal.     Comments: Whited out right cornea.  Legally blind from this eye.  Neck:     Musculoskeletal: Normal range of motion and neck supple.  Cardiovascular:     Rate and Rhythm: Normal rate and regular rhythm.     Heart sounds: Normal heart sounds.  Pulmonary:     Effort: Pulmonary effort is normal.     Breath sounds: Normal breath sounds.  Abdominal:     Palpations: Abdomen is soft.     Tenderness: There is no abdominal tenderness.  Musculoskeletal: Normal range of motion.  Skin:    General: Skin is warm and dry.     Capillary Refill: Capillary refill takes less than 2 seconds.  Neurological:     General: No focal deficit present.     Mental Status: She is alert and oriented to person, place, and time.  Psychiatric:        Mood and Affect: Mood normal.        Behavior: Behavior normal.    Results for orders placed or performed in  visit on 10/27/18 (from the past 24 hour(s))  POCT glucose (manual entry)     Status: Abnormal   Collection Time: 10/27/18 11:54 AM  Result Value Ref Range   POC Glucose 128 (A) 70 - 99 mg/dl  POCT glycosylated hemoglobin (Hb A1C)  Status: Abnormal   Collection Time: 10/27/18 11:56 AM  Result Value Ref Range   Hemoglobin A1C 9.2 (A) 4.0 - 5.6 %   HbA1c POC (<> result, manual entry)     HbA1c, POC (prediabetic range)     HbA1c, POC (controlled diabetic range)       ASSESSMENT & PLAN: Uncontrolled diabetes mellitus ([place]) Uncontrolled diabetes with hemoglobin A1c of 9.2.  We will continue metformin 500 mg twice a day and add Ozempic 0.25 mg once a week for 4 weeks and then increase to 0.50 mg/week.  Diet and exercise encouraged.  Follow-up in 4 weeks.   Maria Maddox was seen today for diabetes and medication refill.  Diagnoses and all orders for this visit:  Type 2 diabetes mellitus with hyperglycemia, without long-term current use of insulin (HCC) -     CBC with Differential/Platelet -     Comprehensive metabolic panel -     Lipid panel -     HM Diabetes Foot Exam -     POCT glucose (manual entry) -     POCT glycosylated hemoglobin (Hb A1C) -     Microalbumin, urine -     metFORMIN (GLUCOPHAGE) 500 MG tablet; Take 1 tablet (500 mg total) by mouth 2 (two) times daily with a meal. -     Semaglutide,0.25 or 0.5MG /DOS, (OZEMPIC, 0.25 OR 0.5 MG/DOSE,) 2 MG/1.5ML SOPN; Inject 0.25 mg into the skin once a week. After 4 weeks, take 0.5mg  weekly.  PCOS (polycystic ovarian syndrome)  History of heavy vaginal bleeding -     Iron -     Ferritin    Patient Instructions       If you have lab work done today you will be contacted with your lab results within the next 2 weeks.  If you have not heard from [place] then please contact us. The fastest way to get your results is to register for My Chart.   IF you received an x-ray today, you will receive an invoice from [facility].  Please contact [facility] at [phone] with questions or concerns regarding your invoice.   IF you received labwork today, you will receive an invoice from [facility]. Please contact [facility] at [phone] with questions or concerns regarding your invoice.   Our billing staff will not be able to assist you with questions regarding bills from these companies.  You will be contacted with the lab results as soon as they are available. The fastest way to get your results is to activate your My Chart account. Instructions are located on the last page of this paperwork. If you have not heard from [place] regarding the results in 2 weeks, please contact this office.     Diabetes Mellitus and Nutrition, Adult When you have diabetes (diabetes mellitus), it is very important to have healthy eating habits because your blood sugar (glucose) levels are greatly affected by what you eat and drink. Eating healthy foods in the appropriate amounts, at about the same times every day, can help you:  Control your blood glucose.  Lower your risk of heart disease.  Improve your blood pressure.  Reach or maintain a healthy weight. Every person with diabetes is different, and each person has different needs for a meal plan. Your health care provider may recommend that you work with a diet and nutrition specialist (dietitian) to make a meal plan that is best for you. Your meal plan may vary depending on factors such as:  The calories you need.  The medicines you take.  Your weight.  Your blood glucose, blood pressure, and cholesterol levels.  Your activity level.  Other health conditions you have, such as heart or kidney disease. How do carbohydrates affect me? Carbohydrates, also called carbs, affect your blood glucose level more than any other type of food. Eating carbs naturally raises the amount of glucose in your blood. Carb counting is a method for keeping track of how many carbs you eat.  Counting carbs is important to keep your blood glucose at a healthy level, especially if you use insulin or take certain oral diabetes medicines. It is important to know how many carbs you can safely have in each meal. This is different for every person. Your dietitian can help you calculate how many carbs you should have at each meal and for each snack. Foods that contain carbs include:  Bread, cereal, rice, pasta, and crackers.  Potatoes and corn.  Peas, beans, and lentils.  Milk and yogurt.  Fruit and juice.  Desserts, such as cakes, cookies, ice cream, and candy. How does alcohol affect me? Alcohol can cause a sudden decrease in blood glucose (hypoglycemia), especially if you use insulin or take certain oral diabetes medicines. Hypoglycemia can be a life-threatening condition. Symptoms of hypoglycemia (sleepiness, dizziness, and confusion) are similar to symptoms of having too much alcohol. If your health care provider says that alcohol is safe for you, follow these guidelines:  Limit alcohol intake to no more than 1 drink per day for nonpregnant women and 2 drinks per day for men. One drink equals 12 oz of beer, 5 oz of wine, or 1 oz of hard liquor.  Do not drink on an empty stomach.  Keep yourself hydrated with water, diet soda, or unsweetened iced tea.  Keep in mind that regular soda, juice, and other mixers may contain a lot of sugar and must be counted as carbs. What are tips for following this plan?  Reading food labels  Start by checking the serving size on the "Nutrition Facts" label of packaged foods and drinks. The amount of calories, carbs, fats, and other nutrients listed on the label is based on one serving of the item. Many items contain more than one serving per package.  Check the total grams (g) of carbs in one serving. You can calculate the number of servings of carbs in one serving by dividing the total carbs by 15. For example, if a food has 30 g of total  carbs, it would be equal to 2 servings of carbs.  Check the number of grams (g) of saturated and trans fats in one serving. Choose foods that have low or no amount of these fats.  Check the number of milligrams (mg) of salt (sodium) in one serving. Most people should limit total sodium intake to less than 2,300 mg per day.  Always check the nutrition information of foods labeled as "low-fat" or "nonfat". These foods may be higher in added sugar or refined carbs and should be avoided.  Talk to your dietitian to identify your daily goals for nutrients listed on the label. Shopping  Avoid buying canned, premade, or processed foods. These foods tend to be high in fat, sodium, and added sugar.  Shop around the outside edge of the grocery store. This includes fresh fruits and vegetables, bulk grains, fresh meats, and fresh dairy. Cooking  Use low-heat cooking methods, such as baking, instead of high-heat cooking methods like deep frying.  Cook using healthy oils, such  as olive, canola, or sunflower oil.  Avoid cooking with butter, cream, or high-fat meats. Meal planning  Eat meals and snacks regularly, preferably at the same times every day. Avoid going long periods of time without eating.  Eat foods high in fiber, such as fresh fruits, vegetables, beans, and whole grains. Talk to your dietitian about how many servings of carbs you can eat at each meal.  Eat 4-6 ounces (oz) of lean protein each day, such as lean meat, chicken, fish, eggs, or tofu. One oz of lean protein is equal to: ? 1 oz of meat, chicken, or fish. ? 1 egg. ?  cup of tofu.  Eat some foods each day that contain healthy fats, such as avocado, nuts, seeds, and fish. Lifestyle  Check your blood glucose regularly.  Exercise regularly as told by your health care provider. This may include: ? 150 minutes of moderate-intensity or vigorous-intensity exercise each week. This could be brisk walking, biking, or water  aerobics. ? Stretching and doing strength exercises, such as yoga or weightlifting, at least 2 times a week.  Take medicines as told by your health care provider.  Do not use any products that contain nicotine or tobacco, such as cigarettes and e-cigarettes. If you need help quitting, ask your health care provider.  Work with a Social worker or diabetes educator to identify strategies to manage stress and any emotional and social challenges. Questions to ask a health care provider  Do I need to meet with a diabetes educator?  Do I need to meet with a dietitian?  What number can I call if I have questions?  When are the best times to check my blood glucose? Where to find more information:  American Diabetes Association: diabetes.org  Academy of Nutrition and Dietetics: www.eatright.[employer] of Diabetes and Digestive and Kidney Diseases (NIH): [employer] Summary  A healthy meal plan will help you control your blood glucose and maintain a healthy lifestyle.  Working with a diet and nutrition specialist (dietitian) can help you make a meal plan that is best for you.  Keep in mind that carbohydrates (carbs) and alcohol have immediate effects on your blood glucose levels. It is important to count carbs and to use alcohol carefully. This information is not intended to replace advice given to you by your health care provider. Make sure you discuss any questions you have with your health care provider. Document Released: 11/30/2004 Document Revised: 02/15/2017 Document Reviewed: 04/09/2016 Elsevier Patient Education  2020 Else[employer] Inc.      Agustina Caroli, MD [facility]

## 2018-10-27 NOTE — Patient Instructions (Addendum)
   If you have lab work done today you will be contacted with your lab results within the next 2 weeks.  If you have not heard from us then please contact us. The fastest way to get your results is to register for My Chart.   IF you received an x-ray today, you will receive an invoice from [facility]. Please contact [facility] at [phone] with questions or concerns regarding your invoice.   IF you received labwork today, you will receive an invoice from [facility]. Please contact [facility] at [phone] with questions or concerns regarding your invoice.   Our billing staff will not be able to assist you with questions regarding bills from these companies.  You will be contacted with the lab results as soon as they are available. The fastest way to get your results is to activate your My Chart account. Instructions are located on the last page of this paperwork. If you have not heard from us regarding the results in 2 weeks, please contact this office.     Diabetes Mellitus and Nutrition, Adult When you have diabetes (diabetes mellitus), it is very important to have healthy eating habits because your blood sugar (glucose) levels are greatly affected by what you eat and drink. Eating healthy foods in the appropriate amounts, at about the same times every day, can help you:  Control your blood glucose.  Lower your risk of heart disease.  Improve your blood pressure.  Reach or maintain a healthy weight. Every person with diabetes is different, and each person has different needs for a meal plan. Your health care provider may recommend that you work with a diet and nutrition specialist (dietitian) to make a meal plan that is best for you. Your meal plan may vary depending on factors such as:  The calories you need.  The medicines you take.  Your weight.  Your blood glucose, blood pressure, and cholesterol levels.  Your activity level.  Other health conditions  you have, such as heart or kidney disease. How do carbohydrates affect me? Carbohydrates, also called carbs, affect your blood glucose level more than any other type of food. Eating carbs naturally raises the amount of glucose in your blood. Carb counting is a method for keeping track of how many carbs you eat. Counting carbs is important to keep your blood glucose at a healthy level, especially if you use insulin or take certain oral diabetes medicines. It is important to know how many carbs you can safely have in each meal. This is different for every person. Your dietitian can help you calculate how many carbs you should have at each meal and for each snack. Foods that contain carbs include:  Bread, cereal, rice, pasta, and crackers.  Potatoes and corn.  Peas, beans, and lentils.  Milk and yogurt.  Fruit and juice.  Desserts, such as cakes, cookies, ice cream, and candy. How does alcohol affect me? Alcohol can cause a sudden decrease in blood glucose (hypoglycemia), especially if you use insulin or take certain oral diabetes medicines. Hypoglycemia can be a life-threatening condition. Symptoms of hypoglycemia (sleepiness, dizziness, and confusion) are similar to symptoms of having too much alcohol. If your health care provider says that alcohol is safe for you, follow these guidelines:  Limit alcohol intake to no more than 1 drink per day for nonpregnant women and 2 drinks per day for men. One drink equals 12 oz of beer, 5 oz of wine, or 1 oz of hard liquor.    Do not drink on an empty stomach.  Keep yourself hydrated with water, diet soda, or unsweetened iced tea.  Keep in mind that regular soda, juice, and other mixers may contain a lot of sugar and must be counted as carbs. What are tips for following this plan?  Reading food labels  Start by checking the serving size on the "Nutrition Facts" label of packaged foods and drinks. The amount of calories, carbs, fats, and other  nutrients listed on the label is based on one serving of the item. Many items contain more than one serving per package.  Check the total grams (g) of carbs in one serving. You can calculate the number of servings of carbs in one serving by dividing the total carbs by 15. For example, if a food has 30 g of total carbs, it would be equal to 2 servings of carbs.  Check the number of grams (g) of saturated and trans fats in one serving. Choose foods that have low or no amount of these fats.  Check the number of milligrams (mg) of salt (sodium) in one serving. Most people should limit total sodium intake to less than 2,300 mg per day.  Always check the nutrition information of foods labeled as "low-fat" or "nonfat". These foods may be higher in added sugar or refined carbs and should be avoided.  Talk to your dietitian to identify your daily goals for nutrients listed on the label. Shopping  Avoid buying canned, premade, or processed foods. These foods tend to be high in fat, sodium, and added sugar.  Shop around the outside edge of the grocery store. This includes fresh fruits and vegetables, bulk grains, fresh meats, and fresh dairy. Cooking  Use low-heat cooking methods, such as baking, instead of high-heat cooking methods like deep frying.  Cook using healthy oils, such as olive, canola, or sunflower oil.  Avoid cooking with butter, cream, or high-fat meats. Meal planning  Eat meals and snacks regularly, preferably at the same times every day. Avoid going long periods of time without eating.  Eat foods high in fiber, such as fresh fruits, vegetables, beans, and whole grains. Talk to your dietitian about how many servings of carbs you can eat at each meal.  Eat 4-6 ounces (oz) of lean protein each day, such as lean meat, chicken, fish, eggs, or tofu. One oz of lean protein is equal to: ? 1 oz of meat, chicken, or fish. ? 1 egg. ?  cup of tofu.  Eat some foods each day that contain  healthy fats, such as avocado, nuts, seeds, and fish. Lifestyle  Check your blood glucose regularly.  Exercise regularly as told by your health care provider. This may include: ? 150 minutes of moderate-intensity or vigorous-intensity exercise each week. This could be brisk walking, biking, or water aerobics. ? Stretching and doing strength exercises, such as yoga or weightlifting, at least 2 times a week.  Take medicines as told by your health care provider.  Do not use any products that contain nicotine or tobacco, such as cigarettes and e-cigarettes. If you need help quitting, ask your health care provider.  Work with a counselor or diabetes educator to identify strategies to manage stress and any emotional and social challenges. Questions to ask a health care provider  Do I need to meet with a diabetes educator?  Do I need to meet with a dietitian?  What number can I call if I have questions?  When are the best times to   check my blood glucose? Where to find more information:  American Diabetes Association: diabetes.org  Academy of Nutrition and Dietetics: www.eatright.org  National Institute of Diabetes and Digestive and Kidney Diseases (NIH): www.niddk.nih.gov Summary  A healthy meal plan will help you control your blood glucose and maintain a healthy lifestyle.  Working with a diet and nutrition specialist (dietitian) can help you make a meal plan that is best for you.  Keep in mind that carbohydrates (carbs) and alcohol have immediate effects on your blood glucose levels. It is important to count carbs and to use alcohol carefully. This information is not intended to replace advice given to you by your health care provider. Make sure you discuss any questions you have with your health care provider. Document Released: 11/30/2004 Document Revised: 02/15/2017 Document Reviewed: 04/09/2016 Elsevier Patient Education  2020 [employer]  

## 2018-10-27 NOTE — Assessment & Plan Note (Signed)
Uncontrolled diabetes with hemoglobin A1c of 9.2.  We will continue metformin 500 mg twice a day and add Ozempic 0.25 mg once a week for 4 weeks and then increase to 0.50 mg/week.  Diet and exercise encouraged.  Follow-up in 4 weeks.

## 2018-10-28 ENCOUNTER — Encounter: Payer: Self-pay | Admitting: Emergency Medicine

## 2018-10-28 ENCOUNTER — Other Ambulatory Visit: Payer: Self-pay | Admitting: Emergency Medicine

## 2018-10-28 LAB — CBC WITH DIFFERENTIAL/PLATELET
Basophils Absolute: 0 10*3/uL (ref 0.0–0.2)
Basos: 0 %
EOS (ABSOLUTE): 0.2 10*3/uL (ref 0.0–0.4)
Eos: 3 %
Hematocrit: 33.1 % — ABNORMAL LOW (ref 34.0–46.6)
Hemoglobin: 11.1 g/dL (ref 11.1–15.9)
Immature Grans (Abs): 0 10*3/uL (ref 0.0–0.1)
Immature Granulocytes: 1 %
Lymphocytes Absolute: 3.1 10*3/uL (ref 0.7–3.1)
Lymphs: 47 %
MCH: 28.2 pg (ref 26.6–33.0)
MCHC: 33.5 g/dL (ref 31.5–35.7)
MCV: 84 fL (ref 79–97)
Monocytes Absolute: 0.3 10*3/uL (ref 0.1–0.9)
Monocytes: 4 %
Neutrophils Absolute: 3 10*3/uL (ref 1.4–7.0)
Neutrophils: 45 %
Platelets: 372 10*3/uL (ref 150–450)
RBC: 3.94 x10E6/uL (ref 3.77–5.28)
RDW: 12.9 % (ref 11.7–15.4)
WBC: 6.6 10*3/uL (ref 3.4–10.8)

## 2018-10-28 LAB — LIPID PANEL
Chol/HDL Ratio: 4.5 ratio — ABNORMAL HIGH (ref 0.0–4.4)
Cholesterol, Total: 199 mg/dL (ref 100–199)
HDL: 44 mg/dL (ref >39)
LDL Calculated: 137 mg/dL — ABNORMAL HIGH (ref 0–99)
Triglycerides: 92 mg/dL (ref 0–149)
VLDL Cholesterol Cal: 18 mg/dL (ref 5–40)

## 2018-10-28 LAB — COMPREHENSIVE METABOLIC PANEL
ALT: 13 IU/L (ref 0–32)
AST: 12 IU/L (ref 0–40)
Albumin/Globulin Ratio: 1 — ABNORMAL LOW (ref 1.2–2.2)
Albumin: 3.8 g/dL (ref 3.8–4.8)
Alkaline Phosphatase: 55 IU/L (ref 39–117)
BUN/Creatinine Ratio: 8 — ABNORMAL LOW (ref 9–23)
BUN: 6 mg/dL (ref 6–20)
Bilirubin Total: 0.2 mg/dL (ref 0.0–1.2)
CO2: 23 mmol/L (ref 20–29)
Calcium: 9 mg/dL (ref 8.7–10.2)
Chloride: 105 mmol/L (ref 96–106)
Creatinine, Ser: 0.78 mg/dL (ref 0.57–1.00)
GFR calc Af Amer: 116 mL/min/{1.73_m2} (ref >59)
GFR calc non Af Amer: 101 mL/min/{1.73_m2} (ref >59)
Globulin, Total: 3.7 g/dL (ref 1.5–4.5)
Glucose: 127 mg/dL — ABNORMAL HIGH (ref 65–99)
Potassium: 4.2 mmol/L (ref 3.5–5.2)
Sodium: 140 mmol/L (ref 134–144)
Total Protein: 7.5 g/dL (ref 6.0–8.5)

## 2018-10-28 LAB — IRON: Iron: 51 ug/dL (ref 27–159)

## 2018-10-28 LAB — MICROALBUMIN, URINE: Microalbumin, Urine: 42.5 ug/mL

## 2018-10-28 LAB — FERRITIN: Ferritin: 26 ng/mL (ref 15–150)

## 2018-10-28 MED ORDER — ROSUVASTATIN CALCIUM 10 MG PO TABS: 10.0000 mg | ORAL_TABLET | Freq: Every day | ORAL | 3 refills | Status: DC

## 2018-11-12 DIAGNOSIS — Z6841 Body Mass Index (BMI) 40.0 and over, adult: Secondary | ICD-10-CM | POA: Diagnosis not present

## 2018-11-12 DIAGNOSIS — E119 Type 2 diabetes mellitus without complications: Secondary | ICD-10-CM | POA: Diagnosis not present

## 2018-11-12 DIAGNOSIS — E282 Polycystic ovarian syndrome: Secondary | ICD-10-CM | POA: Diagnosis not present

## 2018-11-12 DIAGNOSIS — Z01419 Encounter for gynecological examination (general) (routine) without abnormal findings: Secondary | ICD-10-CM | POA: Diagnosis not present

## 2018-11-12 DIAGNOSIS — N856 Intrauterine synechiae: Secondary | ICD-10-CM | POA: Diagnosis not present

## 2018-11-12 DIAGNOSIS — Z1151 Encounter for screening for human papillomavirus (HPV): Secondary | ICD-10-CM | POA: Diagnosis not present

## 2018-11-12 DIAGNOSIS — E669 Obesity, unspecified: Secondary | ICD-10-CM | POA: Diagnosis not present

## 2018-11-12 DIAGNOSIS — N920 Excessive and frequent menstruation with regular cycle: Secondary | ICD-10-CM | POA: Diagnosis not present

## 2018-11-14 LAB — HM PAP SMEAR: HM Pap smear: NEGATIVE

## 2018-11-21 DIAGNOSIS — E119 Type 2 diabetes mellitus without complications: Secondary | ICD-10-CM | POA: Diagnosis not present

## 2018-11-21 DIAGNOSIS — H40051 Ocular hypertension, right eye: Secondary | ICD-10-CM | POA: Diagnosis not present

## 2018-11-21 DIAGNOSIS — H4031X3 Glaucoma secondary to eye trauma, right eye, severe stage: Secondary | ICD-10-CM | POA: Diagnosis not present

## 2018-11-21 DIAGNOSIS — H35012 Changes in retinal vascular appearance, left eye: Secondary | ICD-10-CM | POA: Diagnosis not present

## 2018-11-21 DIAGNOSIS — Z961 Presence of intraocular lens: Secondary | ICD-10-CM | POA: Diagnosis not present

## 2018-11-21 LAB — HM DIABETES EYE EXAM

## 2018-11-21 MED FILL — BRIMONIDINE 0.2% EYE DROP: 0.2 | 90 days supply | Qty: 10 | Fill #0

## 2018-11-21 MED FILL — ATROPINE 1% EYE DROPS: 1 | 90 days supply | Qty: 5 | Fill #0

## 2018-11-26 ENCOUNTER — Encounter: Payer: Self-pay | Admitting: Emergency Medicine

## 2018-11-26 ENCOUNTER — Other Ambulatory Visit: Payer: Self-pay

## 2018-11-26 ENCOUNTER — Ambulatory Visit: Payer: 59 | Admitting: Emergency Medicine

## 2018-11-26 ENCOUNTER — Encounter: Payer: Self-pay | Admitting: *Deleted

## 2018-11-26 VITALS — BP 114/77 | HR 86 | Temp 98.6°F | Resp 16 | Ht 67.0 in | Wt 275.0 lb

## 2018-11-26 DIAGNOSIS — E1169 Type 2 diabetes mellitus with other specified complication: Secondary | ICD-10-CM

## 2018-11-26 DIAGNOSIS — E1165 Type 2 diabetes mellitus with hyperglycemia: Secondary | ICD-10-CM | POA: Diagnosis not present

## 2018-11-26 DIAGNOSIS — E785 Hyperlipidemia, unspecified: Secondary | ICD-10-CM

## 2018-11-26 MED FILL — TIMOLOL 0.25% EYE DROPS: 0.25 | 50 days supply | Qty: 10 | Fill #0

## 2018-11-26 NOTE — Assessment & Plan Note (Signed)
Doing well.  Tolerating Ozempic well.  Will increase dose to 0.5 mg weekly.  Continue metformin.  Continue statin with Crestor 10 mg daily.  Continue diet and exercise as discussed.  Follow-up in 3 months.

## 2018-11-26 NOTE — Patient Instructions (Addendum)
   If you have lab work done today you will be contacted with your lab results within the next 2 weeks.  If you have not heard from us then please contact us. The fastest way to get your results is to register for My Chart.   IF you received an x-ray today, you will receive an invoice from [facility]. Please contact [facility] at [phone] with questions or concerns regarding your invoice.   IF you received labwork today, you will receive an invoice from [facility]. Please contact [facility] at [phone] with questions or concerns regarding your invoice.   Our billing staff will not be able to assist you with questions regarding bills from these companies.  You will be contacted with the lab results as soon as they are available. The fastest way to get your results is to activate your My Chart account. Instructions are located on the last page of this paperwork. If you have not heard from us regarding the results in 2 weeks, please contact this office.     Diabetes Mellitus and Nutrition, Adult When you have diabetes (diabetes mellitus), it is very important to have healthy eating habits because your blood sugar (glucose) levels are greatly affected by what you eat and drink. Eating healthy foods in the appropriate amounts, at about the same times every day, can help you:  Control your blood glucose.  Lower your risk of heart disease.  Improve your blood pressure.  Reach or maintain a healthy weight. Every person with diabetes is different, and each person has different needs for a meal plan. Your health care provider may recommend that you work with a diet and nutrition specialist (dietitian) to make a meal plan that is best for you. Your meal plan may vary depending on factors such as:  The calories you need.  The medicines you take.  Your weight.  Your blood glucose, blood pressure, and cholesterol levels.  Your activity level.  Other health conditions  you have, such as heart or kidney disease. How do carbohydrates affect me? Carbohydrates, also called carbs, affect your blood glucose level more than any other type of food. Eating carbs naturally raises the amount of glucose in your blood. Carb counting is a method for keeping track of how many carbs you eat. Counting carbs is important to keep your blood glucose at a healthy level, especially if you use insulin or take certain oral diabetes medicines. It is important to know how many carbs you can safely have in each meal. This is different for every person. Your dietitian can help you calculate how many carbs you should have at each meal and for each snack. Foods that contain carbs include:  Bread, cereal, rice, pasta, and crackers.  Potatoes and corn.  Peas, beans, and lentils.  Milk and yogurt.  Fruit and juice.  Desserts, such as cakes, cookies, ice cream, and candy. How does alcohol affect me? Alcohol can cause a sudden decrease in blood glucose (hypoglycemia), especially if you use insulin or take certain oral diabetes medicines. Hypoglycemia can be a life-threatening condition. Symptoms of hypoglycemia (sleepiness, dizziness, and confusion) are similar to symptoms of having too much alcohol. If your health care provider says that alcohol is safe for you, follow these guidelines:  Limit alcohol intake to no more than 1 drink per day for nonpregnant women and 2 drinks per day for men. One drink equals 12 oz of beer, 5 oz of wine, or 1 oz of hard liquor.    Do not drink on an empty stomach.  Keep yourself hydrated with water, diet soda, or unsweetened iced tea.  Keep in mind that regular soda, juice, and other mixers may contain a lot of sugar and must be counted as carbs. What are tips for following this plan?  Reading food labels  Start by checking the serving size on the "Nutrition Facts" label of packaged foods and drinks. The amount of calories, carbs, fats, and other  nutrients listed on the label is based on one serving of the item. Many items contain more than one serving per package.  Check the total grams (g) of carbs in one serving. You can calculate the number of servings of carbs in one serving by dividing the total carbs by 15. For example, if a food has 30 g of total carbs, it would be equal to 2 servings of carbs.  Check the number of grams (g) of saturated and trans fats in one serving. Choose foods that have low or no amount of these fats.  Check the number of milligrams (mg) of salt (sodium) in one serving. Most people should limit total sodium intake to less than 2,300 mg per day.  Always check the nutrition information of foods labeled as "low-fat" or "nonfat". These foods may be higher in added sugar or refined carbs and should be avoided.  Talk to your dietitian to identify your daily goals for nutrients listed on the label. Shopping  Avoid buying canned, premade, or processed foods. These foods tend to be high in fat, sodium, and added sugar.  Shop around the outside edge of the grocery store. This includes fresh fruits and vegetables, bulk grains, fresh meats, and fresh dairy. Cooking  Use low-heat cooking methods, such as baking, instead of high-heat cooking methods like deep frying.  Cook using healthy oils, such as olive, canola, or sunflower oil.  Avoid cooking with butter, cream, or high-fat meats. Meal planning  Eat meals and snacks regularly, preferably at the same times every day. Avoid going long periods of time without eating.  Eat foods high in fiber, such as fresh fruits, vegetables, beans, and whole grains. Talk to your dietitian about how many servings of carbs you can eat at each meal.  Eat 4-6 ounces (oz) of lean protein each day, such as lean meat, chicken, fish, eggs, or tofu. One oz of lean protein is equal to: ? 1 oz of meat, chicken, or fish. ? 1 egg. ?  cup of tofu.  Eat some foods each day that contain  healthy fats, such as avocado, nuts, seeds, and fish. Lifestyle  Check your blood glucose regularly.  Exercise regularly as told by your health care provider. This may include: ? 150 minutes of moderate-intensity or vigorous-intensity exercise each week. This could be brisk walking, biking, or water aerobics. ? Stretching and doing strength exercises, such as yoga or weightlifting, at least 2 times a week.  Take medicines as told by your health care provider.  Do not use any products that contain nicotine or tobacco, such as cigarettes and e-cigarettes. If you need help quitting, ask your health care provider.  Work with a counselor or diabetes educator to identify strategies to manage stress and any emotional and social challenges. Questions to ask a health care provider  Do I need to meet with a diabetes educator?  Do I need to meet with a dietitian?  What number can I call if I have questions?  When are the best times to   check my blood glucose? Where to find more information:  American Diabetes Association: diabetes.org  Academy of Nutrition and Dietetics: www.eatright.org  National Institute of Diabetes and Digestive and Kidney Diseases (NIH): www.niddk.nih.gov Summary  A healthy meal plan will help you control your blood glucose and maintain a healthy lifestyle.  Working with a diet and nutrition specialist (dietitian) can help you make a meal plan that is best for you.  Keep in mind that carbohydrates (carbs) and alcohol have immediate effects on your blood glucose levels. It is important to count carbs and to use alcohol carefully. This information is not intended to replace advice given to you by your health care provider. Make sure you discuss any questions you have with your health care provider. Document Released: 11/30/2004 Document Revised: 02/15/2017 Document Reviewed: 04/09/2016 Elsevier Patient Education  2020 [employer].  

## 2018-11-26 NOTE — Progress Notes (Signed)
Lab Results  Component Value Date   HGBA1C 9.2 (A) 10/27/2018   Lab Results  Component Value Date   CHOL 199 10/27/2018   HDL 44 10/27/2018   LDLCALC 137 (H) 10/27/2018   TRIG 92 10/27/2018   CHOLHDL 4.5 (H) 10/27/2018   BP Readings from Last 3 Encounters:  11/26/18 114/77  10/27/18 121/70  07/24/17 112/80   Maria Maddox 33 y.o.   Chief Complaint  Patient presents with  . Diabetes    4 WEEK FOLLOW UP    HISTORY OF PRESENT ILLNESS: This is a 33 y.o. female with history of diabetes here for follow-up.  Doing well.  Has no complaints or medical concerns. 1.  Diabetes: Takes metformin 500 mg twice a day and recently started on Ozempic once a week. 2.  Dyslipidemia: On Crestor 10 mg daily. 10/28/2018 visit here with me: This is a 33 y.o. female with history of diabetes here to establish care with me.  Used to see PA McVeigh.  Has a history of diabetes taking metformin SR twice a day.  At home glucose have been high. Also has a history of PCOS and history of heavy periods.  Sees GYN regularly. 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 No other significant history.  No complaints or medical concerns today HPI   Prior to Admission medications   Medication Sig Start Date End Date Taking? Authorizing Provider  brimonidine (ALPHAGAN P) 0.1 % SOLN Place 1 drop into both eyes 2 (two) times daily.   Yes [provider]  dorzolamide-timolol (COSOPT) 22.3-6.8 MG/ML ophthalmic solution Place 1 drop into the right eye daily.   Yes [provider]  medroxyPROGESTERone (PROVERA) 10 MG tablet Take 10 mg by mouth daily.   Yes [provider]  metFORMIN (GLUCOPHAGE) 500 MG tablet Take 1 tablet (500 mg total) by mouth 2 (two) times daily with a meal. 10/27/18  Yes Angella Montas, Ines Bloomer, MD  rosuvastatin (CRESTOR) 10 MG tablet Take 1 tablet (10 mg total) by mouth daily. 10/28/18  Yes Shawntee Mainwaring, Ines Bloomer, MD  Semaglutide,0.25 or  0.5MG /DOS, (OZEMPIC, 0.25 OR 0.5 MG/DOSE,) 2 MG/1.5ML SOPN Inject 0.25 mg into the skin once a week. After 4 weeks, take 0.5mg  weekly. 10/27/18  Yes SagardiaInes Bloomer, MD    No Known Allergies  Patient Active Problem List   Diagnosis Date Noted  . Uncontrolled diabetes mellitus ([place]) 10/14/2016  . Morbid obesity ([place]) 01/15/2013  . PCOS (polycystic ovarian syndrome) 01/15/2013    Past Medical History:  Diagnosis Date  . Eczema   . Glaucoma    has had for 10 yrears  . Legally blind in right eye, as defined in [place]   . PCOS (polycystic ovarian syndrome)     Past Surgical History:  Procedure Laterality Date  . EYE SURGERY     right side, numerous surgeries (now blind)  . PILONIDAL CYST EXCISION N/A 10/26/2015   Procedure: CYST EXCISION PILONIDAL;  Surgeon: Coralie Keens, MD;  Location: [facility];  Service: General;  Laterality: N/A;    Social History   Socioeconomic History  . Marital status: Married    Spouse name: Not on file  . Number of children: Not on file  . Years of education: Not on file  . Highest education level: Not on file  Occupational History  . Not on file  Social Needs  . Financial resource strain: Not on file  . Food insecurity  Worry: Not on file    Inability: Not on file  . Transportation needs    Medical: Not on file    Non-medical: Not on file  Tobacco Use  . Smoking status: Never Smoker  . Smokeless tobacco: Never Used  Substance and Sexual Activity  . Alcohol use: No  . Drug use: No  . Sexual activity: Yes    Birth control/protection: None  Lifestyle  . Physical activity    Days per week: Not on file    Minutes per session: Not on file  . Stress: Not on file  Relationships  . Social Herbalist on phone: Not on file    Gets together: Not on file    Attends religious service: Not on file    Active member of club or organization: Not on file    Attends meetings of clubs or organizations: Not on file     Relationship status: Not on file  . Intimate partner violence    Fear of current or ex partner: Not on file    Emotionally abused: Not on file    Physically abused: Not on file    Forced sexual activity: Not on file  Other Topics Concern  . Not on file  Social History Narrative  . Not on file    Family History  Problem Relation Age of Onset  . Arthritis Mother   . Depression Mother   . Diabetes Mother   . Hyperlipidemia Mother   . Arthritis Father   . Cancer Maternal Grandmother        breast  . Diabetes Paternal Grandmother   . Diabetes Paternal Grandfather   . Hearing loss Paternal Grandfather   . Cancer Maternal Aunt 50       Breast Cancer     Review of Systems  Constitutional: Negative.  Negative for chills and fever.  HENT: Negative.  Negative for congestion, sinus pain and sore throat.   Eyes: Negative.  Negative for blurred vision and double vision.  Respiratory: Negative.  Negative for cough and shortness of breath.   Cardiovascular: Negative.  Negative for chest pain and palpitations.  Gastrointestinal: Negative.  Negative for abdominal pain, diarrhea, nausea and vomiting.  Genitourinary: Negative.   Musculoskeletal: Negative.   Skin: Negative.  Negative for rash.  Neurological: Negative.  Negative for dizziness and headaches.  Endo/Heme/Allergies: Negative.   All other systems reviewed and are negative.   Today's Vitals   11/26/18 0856  BP: 114/77  Pulse: 86  Resp: 16  Temp: 98.6 F (37 C)  TempSrc: Oral  SpO2: 99%  Weight: 275 lb (124.7 kg)  Height: 5\' 7"  (1.702 m)   Body mass index is 43.07 kg/m.  Physical Exam Vitals signs reviewed.  Constitutional:      Appearance: Normal appearance.  HENT:     Head: Normocephalic.  Eyes:     Extraocular Movements: Extraocular movements intact.     Pupils: Pupils are equal, round, and reactive to light.  Neck:     Musculoskeletal: Normal range of motion and neck supple.  Cardiovascular:     Rate and  Rhythm: Normal rate and regular rhythm.     Heart sounds: Normal heart sounds.  Pulmonary:     Effort: Pulmonary effort is normal.     Breath sounds: Normal breath sounds.  Musculoskeletal: Normal range of motion.  Skin:    General: Skin is warm and dry.     Capillary Refill: Capillary refill takes less than  2 seconds.  Neurological:     General: No focal deficit present.     Mental Status: She is alert and oriented to person, place, and time.  Psychiatric:        Mood and Affect: Mood normal.        Behavior: Behavior normal.      ASSESSMENT & PLAN: Uncontrolled diabetes mellitus ([place]) Doing well.  Tolerating Ozempic well.  Will increase dose to 0.5 mg weekly.  Continue metformin.  Continue statin with Crestor 10 mg daily.  Continue diet and exercise as discussed.  Follow-up in 3 months.   Maria Maddox was seen today for diabetes.  Diagnoses and all orders for this visit:  Type 2 diabetes mellitus with hyperglycemia, without long-term current use of insulin ([facility]) -     Cancel: POCT glucose (manual entry) -     Cancel: POCT glycosylated hemoglobin (Hb A1C)  Uncontrolled type 2 diabetes mellitus with hyperglycemia ([place])  Dyslipidemia associated with type 2 diabetes mellitus ([place])    Patient Instructions       If you have lab work done today you will be contacted with your lab results within the next 2 weeks.  If you have not heard from [place] then please contact us. The fastest way to get your results is to register for My Chart.   IF you received an x-ray today, you will receive an invoice from [facility]. Please contact [facility] at [phone] with questions or concerns regarding your invoice.   IF you received labwork today, you will receive an invoice from [facility]. Please contact [facility] at [phone] with questions or concerns regarding your invoice.   Our billing staff will not be able to assist you with questions regarding bills from these  companies.  You will be contacted with the lab results as soon as they are available. The fastest way to get your results is to activate your My Chart account. Instructions are located on the last page of this paperwork. If you have not heard from [place] regarding the results in 2 weeks, please contact this office.     Diabetes Mellitus and Nutrition, Adult When you have diabetes (diabetes mellitus), it is very important to have healthy eating habits because your blood sugar (glucose) levels are greatly affected by what you eat and drink. Eating healthy foods in the appropriate amounts, at about the same times every day, can help you:  Control your blood glucose.  Lower your risk of heart disease.  Improve your blood pressure.  Reach or maintain a healthy weight. Every person with diabetes is different, and each person has different needs for a meal plan. Your health care provider may recommend that you work with a diet and nutrition specialist (dietitian) to make a meal plan that is best for you. Your meal plan may vary depending on factors such as:  The calories you need.  The medicines you take.  Your weight.  Your blood glucose, blood pressure, and cholesterol levels.  Your activity level.  Other health conditions you have, such as heart or kidney disease. How do carbohydrates affect me? Carbohydrates, also called carbs, affect your blood glucose level more than any other type of food. Eating carbs naturally raises the amount of glucose in your blood. Carb counting is a method for keeping track of how many carbs you eat. Counting carbs is important to keep your blood glucose at a healthy level, especially if you use insulin or take certain oral diabetes medicines. It is  important to know how many carbs you can safely have in each meal. This is different for every person. Your dietitian can help you calculate how many carbs you should have at each meal and for each snack. Foods that  contain carbs include:  Bread, cereal, rice, pasta, and crackers.  Potatoes and corn.  Peas, beans, and lentils.  Milk and yogurt.  Fruit and juice.  Desserts, such as cakes, cookies, ice cream, and candy. How does alcohol affect me? Alcohol can cause a sudden decrease in blood glucose (hypoglycemia), especially if you use insulin or take certain oral diabetes medicines. Hypoglycemia can be a life-threatening condition. Symptoms of hypoglycemia (sleepiness, dizziness, and confusion) are similar to symptoms of having too much alcohol. If your health care provider says that alcohol is safe for you, follow these guidelines:  Limit alcohol intake to no more than 1 drink per day for nonpregnant women and 2 drinks per day for men. One drink equals 12 oz of beer, 5 oz of wine, or 1 oz of hard liquor.  Do not drink on an empty stomach.  Keep yourself hydrated with water, diet soda, or unsweetened iced tea.  Keep in mind that regular soda, juice, and other mixers may contain a lot of sugar and must be counted as carbs. What are tips for following this plan?  Reading food labels  Start by checking the serving size on the "Nutrition Facts" label of packaged foods and drinks. The amount of calories, carbs, fats, and other nutrients listed on the label is based on one serving of the item. Many items contain more than one serving per package.  Check the total grams (g) of carbs in one serving. You can calculate the number of servings of carbs in one serving by dividing the total carbs by 15. For example, if a food has 30 g of total carbs, it would be equal to 2 servings of carbs.  Check the number of grams (g) of saturated and trans fats in one serving. Choose foods that have low or no amount of these fats.  Check the number of milligrams (mg) of salt (sodium) in one serving. Most people should limit total sodium intake to less than 2,300 mg per day.  Always check the nutrition information of  foods labeled as "low-fat" or "nonfat". These foods may be higher in added sugar or refined carbs and should be avoided.  Talk to your dietitian to identify your daily goals for nutrients listed on the label. Shopping  Avoid buying canned, premade, or processed foods. These foods tend to be high in fat, sodium, and added sugar.  Shop around the outside edge of the grocery store. This includes fresh fruits and vegetables, bulk grains, fresh meats, and fresh dairy. Cooking  Use low-heat cooking methods, such as baking, instead of high-heat cooking methods like deep frying.  Cook using healthy oils, such as olive, canola, or sunflower oil.  Avoid cooking with butter, cream, or high-fat meats. Meal planning  Eat meals and snacks regularly, preferably at the same times every day. Avoid going long periods of time without eating.  Eat foods high in fiber, such as fresh fruits, vegetables, beans, and whole grains. Talk to your dietitian about how many servings of carbs you can eat at each meal.  Eat 4-6 ounces (oz) of lean protein each day, such as lean meat, chicken, fish, eggs, or tofu. One oz of lean protein is equal to: ? 1 oz of meat, chicken, or fish. ?  1 egg. ?  cup of tofu.  Eat some foods each day that contain healthy fats, such as avocado, nuts, seeds, and fish. Lifestyle  Check your blood glucose regularly.  Exercise regularly as told by your health care provider. This may include: ? 150 minutes of moderate-intensity or vigorous-intensity exercise each week. This could be brisk walking, biking, or water aerobics. ? Stretching and doing strength exercises, such as yoga or weightlifting, at least 2 times a week.  Take medicines as told by your health care provider.  Do not use any products that contain nicotine or tobacco, such as cigarettes and e-cigarettes. If you need help quitting, ask your health care provider.  Work with a Social worker or diabetes educator to identify  strategies to manage stress and any emotional and social challenges. Questions to ask a health care provider  Do I need to meet with a diabetes educator?  Do I need to meet with a dietitian?  What number can I call if I have questions?  When are the best times to check my blood glucose? Where to find more information:  American Diabetes Association: diabetes.org  Academy of Nutrition and Dietetics: www.eatright.[employer] of Diabetes and Digestive and Kidney Diseases (NIH): [employer] Summary  A healthy meal plan will help you control your blood glucose and maintain a healthy lifestyle.  Working with a diet and nutrition specialist (dietitian) can help you make a meal plan that is best for you.  Keep in mind that carbohydrates (carbs) and alcohol have immediate effects on your blood glucose levels. It is important to count carbs and to use alcohol carefully. This information is not intended to replace advice given to you by your health care provider. Make sure you discuss any questions you have with your health care provider. Document Released: 11/30/2004 Document Revised: 02/15/2017 Document Reviewed: 04/09/2016 Elsevier Patient Education  2020 Elsevier Inc.     Agustina Caroli, MD [facility]

## 2018-12-01 MED FILL — OZEMPIC 0.25 OR 0.5 MG/DOSE: 2 | 28 days supply | Qty: 2 | Fill #0

## 2018-12-15 MED FILL — metFORMIN HCL 500 MG TABS: 500 | 90 days supply | Qty: 180 | Fill #0

## 2018-12-15 MED FILL — MEDROXYPROGESTERONE 10 MG T: 10 | 90 days supply | Qty: 30 | Fill #0

## 2019-01-12 MED FILL — OZEMPIC 0.25 OR 0.5 MG/DOSE: 2 | 28 days supply | Qty: 2 | Fill #1

## 2019-01-19 ENCOUNTER — Encounter: Payer: Self-pay | Admitting: Emergency Medicine

## 2019-01-19 ENCOUNTER — Other Ambulatory Visit: Payer: Self-pay | Admitting: Emergency Medicine

## 2019-01-19 DIAGNOSIS — E1165 Type 2 diabetes mellitus with hyperglycemia: Secondary | ICD-10-CM

## 2019-02-10 MED FILL — OZEMPIC 0.25 OR 0.5 MG/DOSE: 2 | 28 days supply | Qty: 2 | Fill #2

## 2019-02-17 ENCOUNTER — Other Ambulatory Visit: Payer: Self-pay

## 2019-02-17 DIAGNOSIS — Z20822 Contact with and (suspected) exposure to covid-19: Secondary | ICD-10-CM

## 2019-02-19 LAB — NOVEL CORONAVIRUS, NAA: SARS-CoV-2, NAA: NOT DETECTED

## 2019-02-25 ENCOUNTER — Other Ambulatory Visit: Payer: Self-pay

## 2019-02-25 ENCOUNTER — Ambulatory Visit: Payer: 59 | Admitting: Emergency Medicine

## 2019-02-25 ENCOUNTER — Encounter: Payer: Self-pay | Admitting: Emergency Medicine

## 2019-02-25 VITALS — BP 121/75 | HR 83 | Temp 98.4°F | Resp 16 | Ht 67.0 in | Wt 272.2 lb

## 2019-02-25 DIAGNOSIS — E1169 Type 2 diabetes mellitus with other specified complication: Secondary | ICD-10-CM | POA: Diagnosis not present

## 2019-02-25 DIAGNOSIS — Z20822 Contact with and (suspected) exposure to covid-19: Secondary | ICD-10-CM

## 2019-02-25 DIAGNOSIS — E785 Hyperlipidemia, unspecified: Secondary | ICD-10-CM | POA: Diagnosis not present

## 2019-02-25 DIAGNOSIS — E119 Type 2 diabetes mellitus without complications: Secondary | ICD-10-CM

## 2019-02-25 LAB — POCT GLYCOSYLATED HEMOGLOBIN (HGB A1C): Hemoglobin A1C: 7.8 % — AB (ref 4.0–5.6)

## 2019-02-25 LAB — GLUCOSE, POCT (MANUAL RESULT ENTRY): POC Glucose: 111 mg/dl — AB (ref 70–99)

## 2019-02-25 MED ORDER — ROSUVASTATIN CALCIUM 5 MG PO TABS: 5.0000 mg | ORAL_TABLET | Freq: Every day | ORAL | 3 refills | Status: DC

## 2019-02-25 NOTE — Assessment & Plan Note (Signed)
Diabetes improving with hemoglobin A1c less than last time at 7.8.  We will continue present medications and follow-up in 3 months.

## 2019-02-25 NOTE — Progress Notes (Signed)
Maria Maddox 33 y.o.   Chief Complaint  Patient presents with   Diabetes    follow up 3 month   Ear Problem    LEFT -x 1 month per patient hears the pulse beating    HISTORY OF PRESENT ILLNESS: This is a 33 y.o. female with history of diabetes here for 33-month follow-up.  Blood glucose at home between 90 and 100.  Presently taking Metformin and Ozempic 0.5 mg weekly.  Tolerating it well.  No side effects. Was taking Crestor 10 mg daily but stopped due to muscle aches. Complaining of intermittent left ear awareness of pulse beating.  Denies pain. No other complaints or medical concerns today.  HPI   Prior to Admission medications   Medication Sig Start Date End Date Taking? Authorizing Provider  atropine 1 % ophthalmic solution as needed.   Yes [provider]  brimonidine (ALPHAGAN P) 0.1 % SOLN Place 1 drop into both eyes 2 (two) times daily.   Yes [provider]  dorzolamide-timolol (COSOPT) 22.3-6.8 MG/ML ophthalmic solution Place 1 drop into the right eye daily.   Yes [provider]  metFORMIN (GLUCOPHAGE) 500 MG tablet TAKE 1 TABLET(500 MG) BY MOUTH TWICE DAILY WITH A MEAL 01/19/19  Yes Jenissa Tyrell, Ines Bloomer, MD  Semaglutide,0.25 or 0.5MG /DOS, (OZEMPIC, 0.25 OR 0.5 MG/DOSE,) 2 MG/1.5ML SOPN Inject 0.25 mg into the skin once a week. After 4 weeks, take 0.5mg  weekly. 10/27/18  Yes Avyay Coger, Ines Bloomer, MD  medroxyPROGESTERone (PROVERA) 10 MG tablet Take 10 mg by mouth daily.    [provider]  rosuvastatin (CRESTOR) 10 MG tablet Take 1 tablet (10 mg total) by mouth daily. Patient not taking: Reported on 02/25/2019 10/28/18   Horald Pollen, MD    Allergies  Allergen Reactions   Crestor [Rosuvastatin Calcium] Other (See Comments)    Per patient body pain    Patient Active Problem List   Diagnosis Date Noted   Dyslipidemia associated with type 2 diabetes mellitus ([place]) 10/14/2016   Morbid obesity ([place]) 01/15/2013   PCOS  (polycystic ovarian syndrome) 01/15/2013    Past Medical History:  Diagnosis Date   Eczema    Glaucoma    has had for 10 yrears   Legally blind in right eye, as defined in [place]    PCOS (polycystic ovarian syndrome)     Past Surgical History:  Procedure Laterality Date   EYE SURGERY     right side, numerous surgeries (now blind)   PILONIDAL CYST EXCISION N/A 10/26/2015   Procedure: CYST EXCISION PILONIDAL;  Surgeon: Coralie Keens, MD;  Location: [facility];  Service: General;  Laterality: N/A;    Social History   Socioeconomic History   Marital status: Married    Spouse name: Not on file   Number of children: Not on file   Years of education: Not on file   Highest education level: Not on file  Occupational History   Not on file  Social Needs   Financial resource strain: Not on file   Food insecurity    Worry: Not on file    Inability: Not on file   Transportation needs    Medical: Not on file    Non-medical: Not on file  Tobacco Use   Smoking status: Never Smoker   Smokeless tobacco: Never Used  Substance and Sexual Activity   Alcohol use: No   Drug use: No   Sexual activity: Yes    Birth control/protection: None  Lifestyle  Physical activity    Days per week: Not on file    Minutes per session: Not on file   Stress: Not on file  Relationships   Social connections    Talks on phone: Not on file    Gets together: Not on file    Attends religious service: Not on file    Active member of club or organization: Not on file    Attends meetings of clubs or organizations: Not on file    Relationship status: Not on file   Intimate partner violence    Fear of current or ex partner: Not on file    Emotionally abused: Not on file    Physically abused: Not on file    Forced sexual activity: Not on file  Other Topics Concern   Not on file  Social History Narrative   Not on file    Family History  Problem Relation Age of  Onset   Arthritis Mother    Depression Mother    Diabetes Mother    Hyperlipidemia Mother    Arthritis Father    Cancer Maternal Grandmother        breast   Diabetes Paternal Grandmother    Diabetes Paternal Grandfather    Hearing loss Paternal Grandfather    Cancer Maternal Aunt 65       Breast Cancer     Review of Systems  Constitutional: Negative.  Negative for chills and fever.  HENT: Negative.  Negative for congestion, ear discharge, ear pain, hearing loss, sore throat and tinnitus.   Eyes:       Blind from right eye due to trauma when she was a kid.  Respiratory: Negative.  Negative for cough and shortness of breath.   Cardiovascular: Negative.  Negative for chest pain and palpitations.  Gastrointestinal: Negative.  Negative for abdominal pain, diarrhea, nausea and vomiting.  Genitourinary: Negative.  Negative for dysuria and hematuria.  Musculoskeletal: Negative for myalgias.  Skin: Negative.  Negative for rash.  Neurological: Negative.  Negative for dizziness and headaches.  All other systems reviewed and are negative.  Vitals:   02/25/19 0835  BP: 121/75  Pulse: 83  Resp: 16  Temp: 98.4 F (36.9 C)  SpO2: 98%     Physical Exam Vitals signs reviewed.  Constitutional:      Appearance: Normal appearance.  HENT:     Head: Normocephalic.     Left Ear: Tympanic membrane, ear canal and external ear normal.  Eyes:     Comments: Blind from right eye, whited out. Left eye within normal limits  Neck:     Musculoskeletal: Normal range of motion and neck supple.  Cardiovascular:     Rate and Rhythm: Normal rate and regular rhythm.     Pulses: Normal pulses.     Heart sounds: Normal heart sounds.  Pulmonary:     Effort: Pulmonary effort is normal.     Breath sounds: Normal breath sounds.  Musculoskeletal: Normal range of motion.  Lymphadenopathy:     Cervical: No cervical adenopathy.  Skin:    General: Skin is warm and dry.     Capillary Refill:  Capillary refill takes less than 2 seconds.  Neurological:     General: No focal deficit present.     Mental Status: She is alert and oriented to person, place, and time.  Psychiatric:        Mood and Affect: Mood normal.        Behavior: Behavior normal.  Results for orders placed or performed in visit on 02/25/19 (from the past 24 hour(s))  POCT glucose (manual entry)     Status: Abnormal   Collection Time: 02/25/19  9:38 AM  Result Value Ref Range   POC Glucose 111 (A) 70 - 99 mg/dl  POCT glycosylated hemoglobin (Hb A1C)     Status: Abnormal   Collection Time: 02/25/19  9:38 AM  Result Value Ref Range   Hemoglobin A1C 7.8 (A) 4.0 - 5.6 %   HbA1c POC (<> result, manual entry)     HbA1c, POC (prediabetic range)     HbA1c, POC (controlled diabetic range)       ASSESSMENT & PLAN: Dyslipidemia associated with type 2 diabetes mellitus ([place]) Diabetes improving with hemoglobin A1c less than last time at 7.8.  We will continue present medications and follow-up in 3 months.  Maria Maddox was seen today for diabetes and ear problem.  Diagnoses and all orders for this visit:  Dyslipidemia associated with type 2 diabetes mellitus ([place]) -     Lipid panel -     Comprehensive metabolic panel -     rosuvastatin (CRESTOR) 5 MG tablet; Take 1 tablet (5 mg total) by mouth daily.  Type 2 diabetes mellitus without complication, without long-term current use of insulin ([facility]) -     POCT glucose (manual entry) -     POCT glycosylated hemoglobin (Hb A1C)    Patient Instructions       If you have lab work done today you will be contacted with your lab results within the next 2 weeks.  If you have not heard from [place] then please contact us. The fastest way to get your results is to register for My Chart.   IF you received an x-ray today, you will receive an invoice from [facility]. Please contact [facility] at [phone] with questions or concerns regarding your invoice.    IF you received labwork today, you will receive an invoice from [facility]. Please contact [facility] at [phone] with questions or concerns regarding your invoice.   Our billing staff will not be able to assist you with questions regarding bills from these companies.  You will be contacted with the lab results as soon as they are available. The fastest way to get your results is to activate your My Chart account. Instructions are located on the last page of this paperwork. If you have not heard from [place] regarding the results in 2 weeks, please contact this office.     Diabetes Mellitus and Nutrition, Adult When you have diabetes (diabetes mellitus), it is very important to have healthy eating habits because your blood sugar (glucose) levels are greatly affected by what you eat and drink. Eating healthy foods in the appropriate amounts, at about the same times every day, can help you:  Control your blood glucose.  Lower your risk of heart disease.  Improve your blood pressure.  Reach or maintain a healthy weight. Every person with diabetes is different, and each person has different needs for a meal plan. Your health care provider may recommend that you work with a diet and nutrition specialist (dietitian) to make a meal plan that is best for you. Your meal plan may vary depending on factors such as:  The calories you need.  The medicines you take.  Your weight.  Your blood glucose, blood pressure, and cholesterol levels.  Your activity level.  Other health conditions you have, such as heart or kidney disease. How  do carbohydrates affect me? Carbohydrates, also called carbs, affect your blood glucose level more than any other type of food. Eating carbs naturally raises the amount of glucose in your blood. Carb counting is a method for keeping track of how many carbs you eat. Counting carbs is important to keep your blood glucose at a healthy level, especially if you use insulin or  take certain oral diabetes medicines. It is important to know how many carbs you can safely have in each meal. This is different for every person. Your dietitian can help you calculate how many carbs you should have at each meal and for each snack. Foods that contain carbs include:  Bread, cereal, rice, pasta, and crackers.  Potatoes and corn.  Peas, beans, and lentils.  Milk and yogurt.  Fruit and juice.  Desserts, such as cakes, cookies, ice cream, and candy. How does alcohol affect me? Alcohol can cause a sudden decrease in blood glucose (hypoglycemia), especially if you use insulin or take certain oral diabetes medicines. Hypoglycemia can be a life-threatening condition. Symptoms of hypoglycemia (sleepiness, dizziness, and confusion) are similar to symptoms of having too much alcohol. If your health care provider says that alcohol is safe for you, follow these guidelines:  Limit alcohol intake to no more than 1 drink per day for nonpregnant women and 2 drinks per day for men. One drink equals 12 oz of beer, 5 oz of wine, or 1 oz of hard liquor.  Do not drink on an empty stomach.  Keep yourself hydrated with water, diet soda, or unsweetened iced tea.  Keep in mind that regular soda, juice, and other mixers may contain a lot of sugar and must be counted as carbs. What are tips for following this plan?  Reading food labels  Start by checking the serving size on the "Nutrition Facts" label of packaged foods and drinks. The amount of calories, carbs, fats, and other nutrients listed on the label is based on one serving of the item. Many items contain more than one serving per package.  Check the total grams (g) of carbs in one serving. You can calculate the number of servings of carbs in one serving by dividing the total carbs by 15. For example, if a food has 30 g of total carbs, it would be equal to 2 servings of carbs.  Check the number of grams (g) of saturated and trans fats in  one serving. Choose foods that have low or no amount of these fats.  Check the number of milligrams (mg) of salt (sodium) in one serving. Most people should limit total sodium intake to less than 2,300 mg per day.  Always check the nutrition information of foods labeled as "low-fat" or "nonfat". These foods may be higher in added sugar or refined carbs and should be avoided.  Talk to your dietitian to identify your daily goals for nutrients listed on the label. Shopping  Avoid buying canned, premade, or processed foods. These foods tend to be high in fat, sodium, and added sugar.  Shop around the outside edge of the grocery store. This includes fresh fruits and vegetables, bulk grains, fresh meats, and fresh dairy. Cooking  Use low-heat cooking methods, such as baking, instead of high-heat cooking methods like deep frying.  Cook using healthy oils, such as olive, canola, or sunflower oil.  Avoid cooking with butter, cream, or high-fat meats. Meal planning  Eat meals and snacks regularly, preferably at the same times every day. Avoid going long  periods of time without eating.  Eat foods high in fiber, such as fresh fruits, vegetables, beans, and whole grains. Talk to your dietitian about how many servings of carbs you can eat at each meal.  Eat 4-6 ounces (oz) of lean protein each day, such as lean meat, chicken, fish, eggs, or tofu. One oz of lean protein is equal to: ? 1 oz of meat, chicken, or fish. ? 1 egg. ?  cup of tofu.  Eat some foods each day that contain healthy fats, such as avocado, nuts, seeds, and fish. Lifestyle  Check your blood glucose regularly.  Exercise regularly as told by your health care provider. This may include: ? 150 minutes of moderate-intensity or vigorous-intensity exercise each week. This could be brisk walking, biking, or water aerobics. ? Stretching and doing strength exercises, such as yoga or weightlifting, at least 2 times a week.  Take  medicines as told by your health care provider.  Do not use any products that contain nicotine or tobacco, such as cigarettes and e-cigarettes. If you need help quitting, ask your health care provider.  Work with a Social worker or diabetes educator to identify strategies to manage stress and any emotional and social challenges. Questions to ask a health care provider  Do I need to meet with a diabetes educator?  Do I need to meet with a dietitian?  What number can I call if I have questions?  When are the best times to check my blood glucose? Where to find more information:  American Diabetes Association: diabetes.org  Academy of Nutrition and Dietetics: www.eatright.[employer] of Diabetes and Digestive and Kidney Diseases (NIH): [employer] Summary  A healthy meal plan will help you control your blood glucose and maintain a healthy lifestyle.  Working with a diet and nutrition specialist (dietitian) can help you make a meal plan that is best for you.  Keep in mind that carbohydrates (carbs) and alcohol have immediate effects on your blood glucose levels. It is important to count carbs and to use alcohol carefully. This information is not intended to replace advice given to you by your health care provider. Make sure you discuss any questions you have with your health care provider. Document Released: 11/30/2004 Document Revised: 02/15/2017 Document Reviewed: 04/09/2016 Elsevier Patient Education  2020 Elsevier Inc.      Agustina Caroli, MD [facility]

## 2019-02-25 NOTE — Patient Instructions (Addendum)
   If you have lab work done today you will be contacted with your lab results within the next 2 weeks.  If you have not heard from us then please contact us. The fastest way to get your results is to register for My Chart.   IF you received an x-ray today, you will receive an invoice from [facility]. Please contact [facility] at [phone] with questions or concerns regarding your invoice.   IF you received labwork today, you will receive an invoice from [facility]. Please contact [facility] at [phone] with questions or concerns regarding your invoice.   Our billing staff will not be able to assist you with questions regarding bills from these companies.  You will be contacted with the lab results as soon as they are available. The fastest way to get your results is to activate your My Chart account. Instructions are located on the last page of this paperwork. If you have not heard from us regarding the results in 2 weeks, please contact this office.     Diabetes Mellitus and Nutrition, Adult When you have diabetes (diabetes mellitus), it is very important to have healthy eating habits because your blood sugar (glucose) levels are greatly affected by what you eat and drink. Eating healthy foods in the appropriate amounts, at about the same times every day, can help you:  Control your blood glucose.  Lower your risk of heart disease.  Improve your blood pressure.  Reach or maintain a healthy weight. Every person with diabetes is different, and each person has different needs for a meal plan. Your health care provider may recommend that you work with a diet and nutrition specialist (dietitian) to make a meal plan that is best for you. Your meal plan may vary depending on factors such as:  The calories you need.  The medicines you take.  Your weight.  Your blood glucose, blood pressure, and cholesterol levels.  Your activity level.  Other health conditions  you have, such as heart or kidney disease. How do carbohydrates affect me? Carbohydrates, also called carbs, affect your blood glucose level more than any other type of food. Eating carbs naturally raises the amount of glucose in your blood. Carb counting is a method for keeping track of how many carbs you eat. Counting carbs is important to keep your blood glucose at a healthy level, especially if you use insulin or take certain oral diabetes medicines. It is important to know how many carbs you can safely have in each meal. This is different for every person. Your dietitian can help you calculate how many carbs you should have at each meal and for each snack. Foods that contain carbs include:  Bread, cereal, rice, pasta, and crackers.  Potatoes and corn.  Peas, beans, and lentils.  Milk and yogurt.  Fruit and juice.  Desserts, such as cakes, cookies, ice cream, and candy. How does alcohol affect me? Alcohol can cause a sudden decrease in blood glucose (hypoglycemia), especially if you use insulin or take certain oral diabetes medicines. Hypoglycemia can be a life-threatening condition. Symptoms of hypoglycemia (sleepiness, dizziness, and confusion) are similar to symptoms of having too much alcohol. If your health care provider says that alcohol is safe for you, follow these guidelines:  Limit alcohol intake to no more than 1 drink per day for nonpregnant women and 2 drinks per day for men. One drink equals 12 oz of beer, 5 oz of wine, or 1 oz of hard liquor.    Do not drink on an empty stomach.  Keep yourself hydrated with water, diet soda, or unsweetened iced tea.  Keep in mind that regular soda, juice, and other mixers may contain a lot of sugar and must be counted as carbs. What are tips for following this plan?  Reading food labels  Start by checking the serving size on the "Nutrition Facts" label of packaged foods and drinks. The amount of calories, carbs, fats, and other  nutrients listed on the label is based on one serving of the item. Many items contain more than one serving per package.  Check the total grams (g) of carbs in one serving. You can calculate the number of servings of carbs in one serving by dividing the total carbs by 15. For example, if a food has 30 g of total carbs, it would be equal to 2 servings of carbs.  Check the number of grams (g) of saturated and trans fats in one serving. Choose foods that have low or no amount of these fats.  Check the number of milligrams (mg) of salt (sodium) in one serving. Most people should limit total sodium intake to less than 2,300 mg per day.  Always check the nutrition information of foods labeled as "low-fat" or "nonfat". These foods may be higher in added sugar or refined carbs and should be avoided.  Talk to your dietitian to identify your daily goals for nutrients listed on the label. Shopping  Avoid buying canned, premade, or processed foods. These foods tend to be high in fat, sodium, and added sugar.  Shop around the outside edge of the grocery store. This includes fresh fruits and vegetables, bulk grains, fresh meats, and fresh dairy. Cooking  Use low-heat cooking methods, such as baking, instead of high-heat cooking methods like deep frying.  Cook using healthy oils, such as olive, canola, or sunflower oil.  Avoid cooking with butter, cream, or high-fat meats. Meal planning  Eat meals and snacks regularly, preferably at the same times every day. Avoid going long periods of time without eating.  Eat foods high in fiber, such as fresh fruits, vegetables, beans, and whole grains. Talk to your dietitian about how many servings of carbs you can eat at each meal.  Eat 4-6 ounces (oz) of lean protein each day, such as lean meat, chicken, fish, eggs, or tofu. One oz of lean protein is equal to: ? 1 oz of meat, chicken, or fish. ? 1 egg. ?  cup of tofu.  Eat some foods each day that contain  healthy fats, such as avocado, nuts, seeds, and fish. Lifestyle  Check your blood glucose regularly.  Exercise regularly as told by your health care provider. This may include: ? 150 minutes of moderate-intensity or vigorous-intensity exercise each week. This could be brisk walking, biking, or water aerobics. ? Stretching and doing strength exercises, such as yoga or weightlifting, at least 2 times a week.  Take medicines as told by your health care provider.  Do not use any products that contain nicotine or tobacco, such as cigarettes and e-cigarettes. If you need help quitting, ask your health care provider.  Work with a counselor or diabetes educator to identify strategies to manage stress and any emotional and social challenges. Questions to ask a health care provider  Do I need to meet with a diabetes educator?  Do I need to meet with a dietitian?  What number can I call if I have questions?  When are the best times to   check my blood glucose? Where to find more information:  American Diabetes Association: diabetes.org  Academy of Nutrition and Dietetics: www.eatright.org  National Institute of Diabetes and Digestive and Kidney Diseases (NIH): www.niddk.nih.gov Summary  A healthy meal plan will help you control your blood glucose and maintain a healthy lifestyle.  Working with a diet and nutrition specialist (dietitian) can help you make a meal plan that is best for you.  Keep in mind that carbohydrates (carbs) and alcohol have immediate effects on your blood glucose levels. It is important to count carbs and to use alcohol carefully. This information is not intended to replace advice given to you by your health care provider. Make sure you discuss any questions you have with your health care provider. Document Released: 11/30/2004 Document Revised: 02/15/2017 Document Reviewed: 04/09/2016 Elsevier Patient Education  2020 Elsevier Inc.  

## 2019-02-26 ENCOUNTER — Encounter: Payer: Self-pay | Admitting: Emergency Medicine

## 2019-02-26 LAB — COMPREHENSIVE METABOLIC PANEL
ALT: 11 IU/L (ref 0–32)
AST: 15 IU/L (ref 0–40)
Albumin/Globulin Ratio: 1 — ABNORMAL LOW (ref 1.2–2.2)
Albumin: 4.1 g/dL (ref 3.8–4.8)
Alkaline Phosphatase: 71 IU/L (ref 39–117)
BUN/Creatinine Ratio: 10 (ref 9–23)
BUN: 7 mg/dL (ref 6–20)
Bilirubin Total: 0.2 mg/dL (ref 0.0–1.2)
CO2: 18 mmol/L — ABNORMAL LOW (ref 20–29)
Calcium: 9.5 mg/dL (ref 8.7–10.2)
Chloride: 102 mmol/L (ref 96–106)
Creatinine, Ser: 0.7 mg/dL (ref 0.57–1.00)
GFR calc Af Amer: 132 mL/min/{1.73_m2} (ref >59)
GFR calc non Af Amer: 114 mL/min/{1.73_m2} (ref >59)
Globulin, Total: 4.3 g/dL (ref 1.5–4.5)
Glucose: 112 mg/dL — ABNORMAL HIGH (ref 65–99)
Potassium: 4.1 mmol/L (ref 3.5–5.2)
Sodium: 137 mmol/L (ref 134–144)
Total Protein: 8.4 g/dL (ref 6.0–8.5)

## 2019-02-26 LAB — LIPID PANEL
Chol/HDL Ratio: 4 ratio (ref 0.0–4.4)
Cholesterol, Total: 210 mg/dL — ABNORMAL HIGH (ref 100–199)
HDL: 52 mg/dL (ref >39)
LDL Chol Calc (NIH): 146 mg/dL — ABNORMAL HIGH (ref 0–99)
Triglycerides: 68 mg/dL (ref 0–149)
VLDL Cholesterol Cal: 12 mg/dL (ref 5–40)

## 2019-02-27 LAB — NOVEL CORONAVIRUS, NAA: SARS-CoV-2, NAA: NOT DETECTED

## 2019-03-18 MED FILL — OZEMPIC 0.25 OR 0.5 MG/DOSE: 2 | 28 days supply | Qty: 2 | Fill #3

## 2019-03-23 ENCOUNTER — Other Ambulatory Visit: Payer: Self-pay | Admitting: Emergency Medicine

## 2019-03-23 ENCOUNTER — Encounter: Payer: Self-pay | Admitting: Emergency Medicine

## 2019-03-23 DIAGNOSIS — E785 Hyperlipidemia, unspecified: Secondary | ICD-10-CM

## 2019-03-23 DIAGNOSIS — E1169 Type 2 diabetes mellitus with other specified complication: Secondary | ICD-10-CM

## 2019-03-23 MED ORDER — EZETIMIBE 10 MG PO TABS: 10.0000 mg | ORAL_TABLET | Freq: Every day | ORAL | 3 refills | Status: DC

## 2019-03-23 NOTE — Telephone Encounter (Signed)
Stop Crestor, intolerant to statins. Will start Zetia 10 mg daily instead.  Prescription sent to pharmacy of record.  Thanks.

## 2019-04-07 MED FILL — metFORMIN HCL 500 MG TABS: 500 | 90 days supply | Qty: 180 | Fill #0

## 2019-04-07 MED FILL — EZETIMIBE 10 MG TABS: 10 | 90 days supply | Qty: 90 | Fill #0

## 2019-04-17 MED FILL — OZEMPIC 0.25 OR 0.5 MG/DOSE: 2 | 28 days supply | Qty: 2 | Fill #4

## 2019-04-23 MED FILL — TIMOLOL 0.25% EYE DROPS: 0.25 | 50 days supply | Qty: 10 | Fill #1

## 2019-04-23 MED FILL — BRIMONIDINE 0.2% EYE DROP: 0.2 | 90 days supply | Qty: 10 | Fill #1

## 2019-05-27 ENCOUNTER — Other Ambulatory Visit: Payer: Self-pay

## 2019-05-27 ENCOUNTER — Encounter: Payer: Self-pay | Admitting: *Deleted

## 2019-05-27 ENCOUNTER — Ambulatory Visit: Payer: 59 | Admitting: Emergency Medicine

## 2019-05-27 ENCOUNTER — Encounter: Payer: Self-pay | Admitting: Emergency Medicine

## 2019-05-27 VITALS — BP 117/77 | HR 79 | Temp 98.3°F | Ht 67.0 in | Wt 271.0 lb

## 2019-05-27 DIAGNOSIS — E1165 Type 2 diabetes mellitus with hyperglycemia: Secondary | ICD-10-CM

## 2019-05-27 DIAGNOSIS — E785 Hyperlipidemia, unspecified: Secondary | ICD-10-CM

## 2019-05-27 DIAGNOSIS — E1169 Type 2 diabetes mellitus with other specified complication: Secondary | ICD-10-CM | POA: Diagnosis not present

## 2019-05-27 LAB — POCT GLYCOSYLATED HEMOGLOBIN (HGB A1C): Hemoglobin A1C: 7.6 % — AB (ref 4.0–5.6)

## 2019-05-27 LAB — GLUCOSE, POCT (MANUAL RESULT ENTRY): POC Glucose: 107 mg/dl — AB (ref 70–99)

## 2019-05-27 MED ORDER — FARXIGA 5 MG PO TABS: 5.0000 mg | ORAL_TABLET | Freq: Every day | ORAL | 3 refills | Status: AC

## 2019-05-27 MED ORDER — OZEMPIC (0.25 OR 0.5 MG/DOSE) 2 MG/1.5ML ~~LOC~~ SOPN: 0.2500 mg | PEN_INJECTOR | SUBCUTANEOUS | 5 refills | Status: DC

## 2019-05-27 MED FILL — FARXIGA 5 MG TABLET: 5 | 90 days supply | Qty: 90 | Fill #0

## 2019-05-27 NOTE — Patient Instructions (Addendum)
If you have lab work done today you will be contacted with your lab results within the next 2 weeks.  If you have not heard from [place] then please contact us. The fastest way to get your results is to register for My Chart.   IF you received an x-ray today, you will receive an invoice from [facility]. Please contact [facility] at [phone] with questions or concerns regarding your invoice.   IF you received labwork today, you will receive an invoice from [facility]. Please contact [facility] at [phone] with questions or concerns regarding your invoice.   Our billing staff will not be able to assist you with questions regarding bills from these companies.  You will be contacted with the lab results as soon as they are available. The fastest way to get your results is to activate your My Chart account. Instructions are located on the last page of this paperwork. If you have not heard from [place] regarding the results in 2 weeks, please contact this office.      Diabetes Mellitus and Nutrition, Adult When you have diabetes (diabetes mellitus), it is very important to have healthy eating habits because your blood sugar (glucose) levels are greatly affected by what you eat and drink. Eating healthy foods in the appropriate amounts, at about the same times every day, can help you:  Control your blood glucose.  Lower your risk of heart disease.  Improve your blood pressure.  Reach or maintain a healthy weight. Every person with diabetes is different, and each person has different needs for a meal plan. Your health care provider may recommend that you work with a diet and nutrition specialist (dietitian) to make a meal plan that is best for you. Your meal plan may vary depending on factors such as:  The calories you need.  The medicines you take.  Your weight.  Your blood glucose, blood pressure, and cholesterol levels.  Your activity level.  Other health  conditions you have, such as heart or kidney disease. How do carbohydrates affect me? Carbohydrates, also called carbs, affect your blood glucose level more than any other type of food. Eating carbs naturally raises the amount of glucose in your blood. Carb counting is a method for keeping track of how many carbs you eat. Counting carbs is important to keep your blood glucose at a healthy level, especially if you use insulin or take certain oral diabetes medicines. It is important to know how many carbs you can safely have in each meal. This is different for every person. Your dietitian can help you calculate how many carbs you should have at each meal and for each snack. Foods that contain carbs include:  Bread, cereal, rice, pasta, and crackers.  Potatoes and corn.  Peas, beans, and lentils.  Milk and yogurt.  Fruit and juice.  Desserts, such as cakes, cookies, ice cream, and candy. How does alcohol affect me? Alcohol can cause a sudden decrease in blood glucose (hypoglycemia), especially if you use insulin or take certain oral diabetes medicines. Hypoglycemia can be a life-threatening condition. Symptoms of hypoglycemia (sleepiness, dizziness, and confusion) are similar to symptoms of having too much alcohol. If your health care provider says that alcohol is safe for you, follow these guidelines:  Limit alcohol intake to no more than 1 drink per day for nonpregnant women and 2 drinks per day for men. One drink equals 12 oz of beer, 5 oz of wine, or 1 oz of hard  liquor.  Do not drink on an empty stomach.  Keep yourself hydrated with water, diet soda, or unsweetened iced tea.  Keep in mind that regular soda, juice, and other mixers may contain a lot of sugar and must be counted as carbs. What are tips for following this plan?  Reading food labels  Start by checking the serving size on the "Nutrition Facts" label of packaged foods and drinks. The amount of calories, carbs, fats, and  other nutrients listed on the label is based on one serving of the item. Many items contain more than one serving per package.  Check the total grams (g) of carbs in one serving. You can calculate the number of servings of carbs in one serving by dividing the total carbs by 15. For example, if a food has 30 g of total carbs, it would be equal to 2 servings of carbs.  Check the number of grams (g) of saturated and trans fats in one serving. Choose foods that have low or no amount of these fats.  Check the number of milligrams (mg) of salt (sodium) in one serving. Most people should limit total sodium intake to less than 2,300 mg per day.  Always check the nutrition information of foods labeled as "low-fat" or "nonfat". These foods may be higher in added sugar or refined carbs and should be avoided.  Talk to your dietitian to identify your daily goals for nutrients listed on the label. Shopping  Avoid buying canned, premade, or processed foods. These foods tend to be high in fat, sodium, and added sugar.  Shop around the outside edge of the grocery store. This includes fresh fruits and vegetables, bulk grains, fresh meats, and fresh dairy. Cooking  Use low-heat cooking methods, such as baking, instead of high-heat cooking methods like deep frying.  Cook using healthy oils, such as olive, canola, or sunflower oil.  Avoid cooking with butter, cream, or high-fat meats. Meal planning  Eat meals and snacks regularly, preferably at the same times every day. Avoid going long periods of time without eating.  Eat foods high in fiber, such as fresh fruits, vegetables, beans, and whole grains. Talk to your dietitian about how many servings of carbs you can eat at each meal.  Eat 4-6 ounces (oz) of lean protein each day, such as lean meat, chicken, fish, eggs, or tofu. One oz of lean protein is equal to: ? 1 oz of meat, chicken, or fish. ? 1 egg. ?  cup of tofu.  Eat some foods each day that  contain healthy fats, such as avocado, nuts, seeds, and fish. Lifestyle  Check your blood glucose regularly.  Exercise regularly as told by your health care provider. This may include: ? 150 minutes of moderate-intensity or vigorous-intensity exercise each week. This could be brisk walking, biking, or water aerobics. ? Stretching and doing strength exercises, such as yoga or weightlifting, at least 2 times a week.  Take medicines as told by your health care provider.  Do not use any products that contain nicotine or tobacco, such as cigarettes and e-cigarettes. If you need help quitting, ask your health care provider.  Work with a Social worker or diabetes educator to identify strategies to manage stress and any emotional and social challenges. Questions to ask a health care provider  Do I need to meet with a diabetes educator?  Do I need to meet with a dietitian?  What number can I call if I have questions?  When are the best  times to check my blood glucose? Where to find more information:  American Diabetes Association: diabetes.org  Academy of Nutrition and Dietetics: www.eatright.[employer] of Diabetes and Digestive and Kidney Diseases (NIH): [employer] Summary  A healthy meal plan will help you control your blood glucose and maintain a healthy lifestyle.  Working with a diet and nutrition specialist (dietitian) can help you make a meal plan that is best for you.  Keep in mind that carbohydrates (carbs) and alcohol have immediate effects on your blood glucose levels. It is important to count carbs and to use alcohol carefully. This information is not intended to replace advice given to you by your health care provider. Make sure you discuss any questions you have with your health care provider. Document Revised: 02/15/2017 Document Reviewed: 04/09/2016 Elsevier Patient Education  2020 [employer].

## 2019-05-27 NOTE — Assessment & Plan Note (Signed)
Uncontrolled diabetes with hemoglobin A1c 7.6.  Continue metformin 500 mg twice a day and Ozempic 0.5 mg weekly. We will add [place] or Jardiance depending on insurance's formulary. Diet and nutrition discussed. Follow-up in 3 months.

## 2019-05-27 NOTE — Progress Notes (Signed)
Maria Maddox 34 y.o.   Chief Complaint  Patient presents with  . Diabetes    HISTORY OF PRESENT ILLNESS: This is a 34 y.o. female with history of diabetes here for follow-up.  Doing well.  No complaints or medical concerns today. Presently taking Metformin 500 mg twice a day and Ozempic 0.25 mg weekly. History of dyslipidemia intolerant to statins, on Zetia 10 mg daily.  Doing well.  HPI   Prior to Admission medications   Medication Sig Start Date End Date Taking? Authorizing Provider  atropine 1 % ophthalmic solution as needed.   Yes [provider]  brimonidine (ALPHAGAN P) 0.1 % SOLN Place 1 drop into both eyes 2 (two) times daily.   Yes [provider]  dorzolamide-timolol (COSOPT) 22.3-6.8 MG/ML ophthalmic solution Place 1 drop into the right eye daily.   Yes [provider]  ezetimibe (ZETIA) 10 MG tablet Take 1 tablet (10 mg total) by mouth daily. 03/23/19  Yes Jaideep Pollack, Ines Bloomer, MD  metFORMIN (GLUCOPHAGE) 500 MG tablet TAKE 1 TABLET(500 MG) BY MOUTH TWICE DAILY WITH A MEAL 01/19/19  Yes Horice Carrero, Ines Bloomer, MD  Semaglutide,0.25 or 0.5MG /DOS, (OZEMPIC, 0.25 OR 0.5 MG/DOSE,) 2 MG/1.5ML SOPN Inject 0.25 mg into the skin once a week. After 4 weeks, take 0.5mg  weekly. 10/27/18  Yes Horald Pollen, MD  AFLURIA QUADRIVALENT 0.5 ML injection  01/19/19   [provider]  timolol (TIMOPTIC) 0.25 % ophthalmic solution  04/23/19   [provider]    Allergies  Allergen Reactions  . Crestor [Rosuvastatin Calcium] Other (See Comments)    Per patient body pain    Patient Active Problem List   Diagnosis Date Noted  . Dyslipidemia associated with type 2 diabetes mellitus ([place]) 10/14/2016  . Morbid obesity ([place]) 01/15/2013  . PCOS (polycystic ovarian syndrome) 01/15/2013    Past Medical History:  Diagnosis Date  . Eczema   . Glaucoma    has had for 10 yrears  . Legally blind in right eye, as defined in [place]   . PCOS (polycystic  ovarian syndrome)     Past Surgical History:  Procedure Laterality Date  . EYE SURGERY     right side, numerous surgeries (now blind)  . PILONIDAL CYST EXCISION N/A 10/26/2015   Procedure: CYST EXCISION PILONIDAL;  Surgeon: Coralie Keens, MD;  Location: [place];  Service: General;  Laterality: N/A;    Social History   Socioeconomic History  . Marital status: Married    Spouse name: Not on file  . Number of children: Not on file  . Years of education: Not on file  . Highest education level: Not on file  Occupational History  . Not on file  Tobacco Use  . Smoking status: Never Smoker  . Smokeless tobacco: Never Used  Substance and Sexual Activity  . Alcohol use: No  . Drug use: No  . Sexual activity: Yes    Birth control/protection: None  Other Topics Concern  . Not on file  Social History Narrative  . Not on file   Social Determinants of Health   Financial Resource Strain:   . Difficulty of Paying Living Expenses: Not on file  Food Insecurity:   . Worried About Charity fundraiser in the Last Year: Not on file  . Ran Out of Food in the Last Year: Not on file  Transportation Needs:   . Lack of Transportation (Medical): Not on file  . Lack of Transportation (Non-Medical): Not  on file  Physical Activity:   . Days of Exercise per Week: Not on file  . Minutes of Exercise per Session: Not on file  Stress:   . Feeling of Stress : Not on file  Social Connections:   . Frequency of Communication with Friends and Family: Not on file  . Frequency of Social Gatherings with Friends and Family: Not on file  . Attends Religious Services: Not on file  . Active Member of Clubs or Organizations: Not on file  . Attends Archivist Meetings: Not on file  . Marital Status: Not on file  Intimate Partner Violence:   . Fear of Current or Ex-Partner: Not on file  . Emotionally Abused: Not on file  . Physically Abused: Not on file  . Sexually Abused: Not on  file    Family History  Problem Relation Age of Onset  . Arthritis Mother   . Depression Mother   . Diabetes Mother   . Hyperlipidemia Mother   . Arthritis Father   . Cancer Maternal Grandmother        breast  . Diabetes Paternal Grandmother   . Diabetes Paternal Grandfather   . Hearing loss Paternal Grandfather   . Cancer Maternal Aunt 50       Breast Cancer     Review of Systems  Constitutional: Negative.  Negative for chills and fever.  HENT: Negative.  Negative for congestion and sore throat.   Eyes:       Blind from right eye  Respiratory: Negative.  Negative for cough and shortness of breath.   Cardiovascular: Negative.  Negative for chest pain and palpitations.  Gastrointestinal: Negative.  Negative for abdominal pain, blood in stool, diarrhea, melena, nausea and vomiting.  Genitourinary: Negative.  Negative for dysuria.  Musculoskeletal: Negative.   Skin: Negative.  Negative for rash.  Neurological: Negative for dizziness and headaches.  All other systems reviewed and are negative.   Today's Vitals   05/27/19 0803  BP: 117/77  Pulse: 79  Temp: 98.3 F (36.8 C)  TempSrc: Temporal  SpO2: 98%  Weight: 271 lb (122.9 kg)  Height: 5\' 7"  (1.702 m)   Body mass index is 42.44 kg/m.  Physical Exam Vitals reviewed.  Constitutional:      Appearance: Normal appearance. She is obese.  HENT:     Head: Normocephalic.  Eyes:     Extraocular Movements: Extraocular movements intact.     Conjunctiva/sclera: Conjunctivae normal.     Pupils: Pupils are equal, round, and reactive to light.  Cardiovascular:     Rate and Rhythm: Normal rate and regular rhythm.     Pulses: Normal pulses.     Heart sounds: Normal heart sounds.  Musculoskeletal:        General: Normal range of motion.     Cervical back: Normal range of motion and neck supple.  Skin:    General: Skin is warm and dry.     Capillary Refill: Capillary refill takes less than 2 seconds.  Neurological:      General: No focal deficit present.     Mental Status: She is alert and oriented to person, place, and time.  Psychiatric:        Mood and Affect: Mood normal.        Behavior: Behavior normal.    Results for orders placed or performed in visit on 05/27/19 (from the past 24 hour(s))  POCT glucose (manual entry)     Status: Abnormal   Collection  Time: 05/27/19  8:41 AM  Result Value Ref Range   POC Glucose 107 (A) 70 - 99 mg/dl  POCT glycosylated hemoglobin (Hb A1C)     Status: Abnormal   Collection Time: 05/27/19  8:46 AM  Result Value Ref Range   Hemoglobin A1C 7.6 (A) 4.0 - 5.6 %   HbA1c POC (<> result, manual entry)     HbA1c, POC (prediabetic range)     HbA1c, POC (controlled diabetic range)     A total of 30 minutes was spent with the patient, greater than 50% of which was in counseling/coordination of care regarding diabetes and management including diet, nutrition, and medications, review of most recent office visit notes, review of most recent blood work results including today's hemoglobin A1c, cardiovascular risks associated with diabetes, review of all medications, prognosis and need for follow-up in 3 months.   ASSESSMENT & PLAN: Dyslipidemia associated with type 2 diabetes mellitus ([place]) Uncontrolled diabetes with hemoglobin A1c 7.6.  Continue metformin 500 mg twice a day and Ozempic 0.5 mg weekly. We will add [place] or Jardiance depending on insurance's formulary. Diet and nutrition discussed. Follow-up in 3 months.  Junne was seen today for diabetes.  Diagnoses and all orders for this visit:  Dyslipidemia associated with type 2 diabetes mellitus ([place]) -     Comprehensive metabolic panel -     Lipid panel -     CBC with Differential/Platelet  Type 2 diabetes mellitus with hyperglycemia, without long-term current use of insulin ([facility]) -     POCT glucose (manual entry) -     POCT glycosylated hemoglobin (Hb A1C) -     dapagliflozin propanediol (FARXIGA) 5 MG TABS  tablet; Take 5 mg by mouth daily before breakfast.  Morbid obesity ([place])    Patient Instructions       If you have lab work done today you will be contacted with your lab results within the next 2 weeks.  If you have not heard from [place] then please contact us. The fastest way to get your results is to register for My Chart.   IF you received an x-ray today, you will receive an invoice from [facility]iology. Please contact [facility]iology at [phone] with questions or concerns regarding your invoice.   IF you received labwork today, you will receive an invoice from [facility]. Please contact [facility] at [phone] with questions or concerns regarding your invoice.   Our billing staff will not be able to assist you with questions regarding bills from these companies.  You will be contacted with the lab results as soon as they are available. The fastest way to get your results is to activate your My Chart account. Instructions are located on the last page of this paperwork. If you have not heard from [place] regarding the results in 2 weeks, please contact this office.      Diabetes Mellitus and Nutrition, Adult When you have diabetes (diabetes mellitus), it is very important to have healthy eating habits because your blood sugar (glucose) levels are greatly affected by what you eat and drink. Eating healthy foods in the appropriate amounts, at about the same times every day, can help you:  Control your blood glucose.  Lower your risk of heart disease.  Improve your blood pressure.  Reach or maintain a healthy weight. Every person with diabetes is different, and each person has different needs for a meal plan. Your health care provider may recommend that you work with a diet and nutrition  specialist (dietitian) to make a meal plan that is best for you. Your meal plan may vary depending on factors such as:  The calories you need.  The medicines you take.  Your  weight.  Your blood glucose, blood pressure, and cholesterol levels.  Your activity level.  Other health conditions you have, such as heart or kidney disease. How do carbohydrates affect me? Carbohydrates, also called carbs, affect your blood glucose level more than any other type of food. Eating carbs naturally raises the amount of glucose in your blood. Carb counting is a method for keeping track of how many carbs you eat. Counting carbs is important to keep your blood glucose at a healthy level, especially if you use insulin or take certain oral diabetes medicines. It is important to know how many carbs you can safely have in each meal. This is different for every person. Your dietitian can help you calculate how many carbs you should have at each meal and for each snack. Foods that contain carbs include:  Bread, cereal, rice, pasta, and crackers.  Potatoes and corn.  Peas, beans, and lentils.  Milk and yogurt.  Fruit and juice.  Desserts, such as cakes, cookies, ice cream, and candy. How does alcohol affect me? Alcohol can cause a sudden decrease in blood glucose (hypoglycemia), especially if you use insulin or take certain oral diabetes medicines. Hypoglycemia can be a life-threatening condition. Symptoms of hypoglycemia (sleepiness, dizziness, and confusion) are similar to symptoms of having too much alcohol. If your health care provider says that alcohol is safe for you, follow these guidelines:  Limit alcohol intake to no more than 1 drink per day for nonpregnant women and 2 drinks per day for men. One drink equals 12 oz of beer, 5 oz of wine, or 1 oz of hard liquor.  Do not drink on an empty stomach.  Keep yourself hydrated with water, diet soda, or unsweetened iced tea.  Keep in mind that regular soda, juice, and other mixers may contain a lot of sugar and must be counted as carbs. What are tips for following this plan?  Reading food labels  Start by checking the  serving size on the "Nutrition Facts" label of packaged foods and drinks. The amount of calories, carbs, fats, and other nutrients listed on the label is based on one serving of the item. Many items contain more than one serving per package.  Check the total grams (g) of carbs in one serving. You can calculate the number of servings of carbs in one serving by dividing the total carbs by 15. For example, if a food has 30 g of total carbs, it would be equal to 2 servings of carbs.  Check the number of grams (g) of saturated and trans fats in one serving. Choose foods that have low or no amount of these fats.  Check the number of milligrams (mg) of salt (sodium) in one serving. Most people should limit total sodium intake to less than 2,300 mg per day.  Always check the nutrition information of foods labeled as "low-fat" or "nonfat". These foods may be higher in added sugar or refined carbs and should be avoided.  Talk to your dietitian to identify your daily goals for nutrients listed on the label. Shopping  Avoid buying canned, premade, or processed foods. These foods tend to be high in fat, sodium, and added sugar.  Shop around the outside edge of the grocery store. This includes fresh fruits and vegetables, bulk grains,  fresh meats, and fresh dairy. Cooking  Use low-heat cooking methods, such as baking, instead of high-heat cooking methods like deep frying.  Cook using healthy oils, such as olive, canola, or sunflower oil.  Avoid cooking with butter, cream, or high-fat meats. Meal planning  Eat meals and snacks regularly, preferably at the same times every day. Avoid going long periods of time without eating.  Eat foods high in fiber, such as fresh fruits, vegetables, beans, and whole grains. Talk to your dietitian about how many servings of carbs you can eat at each meal.  Eat 4-6 ounces (oz) of lean protein each day, such as lean meat, chicken, fish, eggs, or tofu. One oz of lean  protein is equal to: ? 1 oz of meat, chicken, or fish. ? 1 egg. ?  cup of tofu.  Eat some foods each day that contain healthy fats, such as avocado, nuts, seeds, and fish. Lifestyle  Check your blood glucose regularly.  Exercise regularly as told by your health care provider. This may include: ? 150 minutes of moderate-intensity or vigorous-intensity exercise each week. This could be brisk walking, biking, or water aerobics. ? Stretching and doing strength exercises, such as yoga or weightlifting, at least 2 times a week.  Take medicines as told by your health care provider.  Do not use any products that contain nicotine or tobacco, such as cigarettes and e-cigarettes. If you need help quitting, ask your health care provider.  Work with a Social worker or diabetes educator to identify strategies to manage stress and any emotional and social challenges. Questions to ask a health care provider  Do I need to meet with a diabetes educator?  Do I need to meet with a dietitian?  What number can I call if I have questions?  When are the best times to check my blood glucose? Where to find more information:  American Diabetes Association: diabetes.org  Academy of Nutrition and Dietetics: www.eatright.[employer] of Diabetes and Digestive and Kidney Diseases (NIH): [employer] Summary  A healthy meal plan will help you control your blood glucose and maintain a healthy lifestyle.  Working with a diet and nutrition specialist (dietitian) can help you make a meal plan that is best for you.  Keep in mind that carbohydrates (carbs) and alcohol have immediate effects on your blood glucose levels. It is important to count carbs and to use alcohol carefully. This information is not intended to replace advice given to you by your health care provider. Make sure you discuss any questions you have with your health care provider. Document Revised: 02/15/2017 Document Reviewed:  04/09/2016 Elsevier Patient Education  2020 [employer]      Agustina Caroli, MD [facility]

## 2019-05-28 ENCOUNTER — Encounter: Payer: Self-pay | Admitting: Emergency Medicine

## 2019-05-28 LAB — CBC WITH DIFFERENTIAL/PLATELET
Basophils Absolute: 0 10*3/uL (ref 0.0–0.2)
Basos: 0 %
EOS (ABSOLUTE): 0.3 10*3/uL (ref 0.0–0.4)
Eos: 6 %
Hematocrit: 36.5 % (ref 34.0–46.6)
Hemoglobin: 12.2 g/dL (ref 11.1–15.9)
Immature Grans (Abs): 0 10*3/uL (ref 0.0–0.1)
Immature Granulocytes: 0 %
Lymphocytes Absolute: 2.4 10*3/uL (ref 0.7–3.1)
Lymphs: 42 %
MCH: 26.3 pg — ABNORMAL LOW (ref 26.6–33.0)
MCHC: 33.4 g/dL (ref 31.5–35.7)
MCV: 79 fL (ref 79–97)
Monocytes Absolute: 0.3 10*3/uL (ref 0.1–0.9)
Monocytes: 5 %
Neutrophils Absolute: 2.6 10*3/uL (ref 1.4–7.0)
Neutrophils: 47 %
Platelets: 336 10*3/uL (ref 150–450)
RBC: 4.64 x10E6/uL (ref 3.77–5.28)
RDW: 14.1 % (ref 11.7–15.4)
WBC: 5.6 10*3/uL (ref 3.4–10.8)

## 2019-05-28 LAB — COMPREHENSIVE METABOLIC PANEL
ALT: 13 IU/L (ref 0–32)
AST: 15 IU/L (ref 0–40)
Albumin/Globulin Ratio: 1 — ABNORMAL LOW (ref 1.2–2.2)
Albumin: 3.8 g/dL (ref 3.8–4.8)
Alkaline Phosphatase: 60 IU/L (ref 39–117)
BUN/Creatinine Ratio: 10 (ref 9–23)
BUN: 8 mg/dL (ref 6–20)
Bilirubin Total: 0.3 mg/dL (ref 0.0–1.2)
CO2: 23 mmol/L (ref 20–29)
Calcium: 9.2 mg/dL (ref 8.7–10.2)
Chloride: 100 mmol/L (ref 96–106)
Creatinine, Ser: 0.78 mg/dL (ref 0.57–1.00)
GFR calc Af Amer: 116 mL/min/{1.73_m2} (ref >59)
GFR calc non Af Amer: 100 mL/min/{1.73_m2} (ref >59)
Globulin, Total: 3.7 g/dL (ref 1.5–4.5)
Glucose: 105 mg/dL — ABNORMAL HIGH (ref 65–99)
Potassium: 3.9 mmol/L (ref 3.5–5.2)
Sodium: 137 mmol/L (ref 134–144)
Total Protein: 7.5 g/dL (ref 6.0–8.5)

## 2019-05-28 LAB — LIPID PANEL
Chol/HDL Ratio: 4.1 ratio (ref 0.0–4.4)
Cholesterol, Total: 165 mg/dL (ref 100–199)
HDL: 40 mg/dL (ref >39)
LDL Chol Calc (NIH): 110 mg/dL — ABNORMAL HIGH (ref 0–99)
Triglycerides: 77 mg/dL (ref 0–149)
VLDL Cholesterol Cal: 15 mg/dL (ref 5–40)

## 2019-05-28 MED FILL — OZEMPIC 0.25 OR 0.5 MG/DOSE: 2 | 28 days supply | Qty: 2 | Fill #0

## 2019-07-03 MED FILL — metFORMIN HCL 500 MG TABS: 500 | 90 days supply | Qty: 180 | Fill #1

## 2019-07-03 MED FILL — OZEMPIC 0.25 OR 0.5 MG/DOSE: 2 | 28 days supply | Qty: 2 | Fill #1

## 2019-07-03 MED FILL — EZETIMIBE 10 MG TABS: 10 | 90 days supply | Qty: 90 | Fill #1

## 2019-08-07 MED FILL — OZEMPIC 0.25 OR 0.5 MG/DOSE: 2 | 84 days supply | Qty: 5 | Fill #2

## 2019-08-07 MED FILL — BRIMONIDINE 0.2% EYE DROP: 0.2 | 90 days supply | Qty: 10 | Fill #2

## 2019-08-07 MED FILL — TIMOLOL 0.25% EYE DROPS: 0.25 | 25 days supply | Qty: 5 | Fill #2

## 2019-08-26 ENCOUNTER — Encounter: Payer: Self-pay | Admitting: Emergency Medicine

## 2019-08-26 ENCOUNTER — Ambulatory Visit: Payer: 59 | Admitting: Emergency Medicine

## 2019-08-26 ENCOUNTER — Other Ambulatory Visit: Payer: Self-pay

## 2019-08-26 VITALS — BP 123/71 | HR 85 | Temp 98.0°F | Ht 68.0 in | Wt 253.8 lb

## 2019-08-26 DIAGNOSIS — E1169 Type 2 diabetes mellitus with other specified complication: Secondary | ICD-10-CM

## 2019-08-26 DIAGNOSIS — E282 Polycystic ovarian syndrome: Secondary | ICD-10-CM | POA: Diagnosis not present

## 2019-08-26 DIAGNOSIS — E785 Hyperlipidemia, unspecified: Secondary | ICD-10-CM

## 2019-08-26 DIAGNOSIS — E1165 Type 2 diabetes mellitus with hyperglycemia: Secondary | ICD-10-CM | POA: Diagnosis not present

## 2019-08-26 LAB — POCT GLYCOSYLATED HEMOGLOBIN (HGB A1C): Hemoglobin A1C: 6.3 % — AB (ref 4.0–5.6)

## 2019-08-26 LAB — GLUCOSE, POCT (MANUAL RESULT ENTRY): POC Glucose: 103 mg/dl — AB (ref 70–99)

## 2019-08-26 NOTE — Progress Notes (Signed)
Maria Maddox 34 y.o.   Chief Complaint  Patient presents with   Diabetes   ASSESSMENT & PLAN: Dyslipidemia associated with type 2 diabetes mellitus ([place]) Uncontrolled diabetes with hemoglobin A1c 7.6.  Continue metformin 500 mg twice a day and Ozempic 0.5 mg weekly. We will add [place] or Jardiance depending on insurance's formulary. Diet and nutrition discussed. Follow-up in 3 months. HISTORY OF PRESENT ILLNESS: This is a 34 y.o. female with history of diabetes here for follow-up.  Doing well.  Eating better.  Losing weight. However feeling tired all the time.  Presently taking Metformin 500 mg twice a day, Ozempic 0.5 mg weekly, and Farxiga 5 mg daily. Wt Readings from Last 3 Encounters:  08/26/19 253 lb 12.8 oz (115.1 kg)  05/27/19 271 lb (122.9 kg)  02/25/19 272 lb 3.2 oz (123.5 kg)    Lab Results  Component Value Date   HGBA1C 7.6 (A) 05/27/2019   Lab Results  Component Value Date   CHOL 165 05/27/2019   HDL 40 05/27/2019   LDLCALC 110 (H) 05/27/2019   TRIG 77 05/27/2019   CHOLHDL 4.1 05/27/2019  No other complaints or medical concerns today.  HPI   Prior to Admission medications   Medication Sig Start Date End Date Taking? Authorizing Provider  AFLURIA QUADRIVALENT 0.5 ML injection  01/19/19  Yes [provider]  atropine 1 % ophthalmic solution as needed.   Yes [provider]  brimonidine (ALPHAGAN P) 0.1 % SOLN Place 1 drop into both eyes 2 (two) times daily.   Yes [provider]  dapagliflozin propanediol (FARXIGA) 5 MG TABS tablet Take 5 mg by mouth daily.   Yes [provider]  ezetimibe (ZETIA) 10 MG tablet Take 1 tablet (10 mg total) by mouth daily. 03/23/19  Yes Guthrie Lemme, Ines Bloomer, MD  metFORMIN (GLUCOPHAGE) 500 MG tablet TAKE 1 TABLET(500 MG) BY MOUTH TWICE DAILY WITH A MEAL 01/19/19  Yes Maiko Salais, Ines Bloomer, MD  Semaglutide,0.25 or 0.5MG /DOS, (OZEMPIC, 0.25 OR 0.5 MG/DOSE,) 2 MG/1.5ML SOPN Inject 0.25 mg into the  skin once a week. After 4 weeks, take 0.5mg  weekly. 05/27/19  Yes Altan Kraai, Ines Bloomer, MD  timolol (TIMOPTIC) 0.25 % ophthalmic solution  04/23/19  Yes [provider]  dorzolamide-timolol (COSOPT) 22.3-6.8 MG/ML ophthalmic solution Place 1 drop into the right eye daily.    [provider]    Allergies  Allergen Reactions   Crestor [Rosuvastatin Calcium] Other (See Comments)    Per patient body pain    Patient Active Problem List   Diagnosis Date Noted   Dyslipidemia associated with type 2 diabetes mellitus ([place]) 10/14/2016   Morbid obesity ([place]) 01/15/2013   PCOS (polycystic ovarian syndrome) 01/15/2013    Past Medical History:  Diagnosis Date   Eczema    Glaucoma    has had for 10 yrears   Legally blind in right eye, as defined in [place]    PCOS (polycystic ovarian syndrome)     Past Surgical History:  Procedure Laterality Date   EYE SURGERY     right side, numerous surgeries (now blind)   PILONIDAL CYST EXCISION N/A 10/26/2015   Procedure: CYST EXCISION PILONIDAL;  Surgeon: Coralie Keens, MD;  Location: [facility];  Service: General;  Laterality: N/A;    Social History   Socioeconomic History   Marital status: Married    Spouse name: Not on file   Number of children: Not on file   Years of education: Not on file  Highest education level: Not on file  Occupational History   Not on file  Tobacco Use   Smoking status: Never Smoker   Smokeless tobacco: Never Used  Substance and Sexual Activity   Alcohol use: No   Drug use: No   Sexual activity: Yes    Birth control/protection: None  Other Topics Concern   Not on file  Social History Narrative   Not on file   Social Determinants of Health   Financial Resource Strain:    Difficulty of Paying Living Expenses:   Food Insecurity:    Worried About Charity fundraiser in the Last Year:    Arboriculturist in the Last Year:   Transportation Needs:    Consulting civil engineer (Medical):    Lack of Transportation (Non-Medical):   Physical Activity:    Days of Exercise per Week:    Minutes of Exercise per Session:   Stress:    Feeling of Stress :   Social Connections:    Frequency of Communication with Friends and Family:    Frequency of Social Gatherings with Friends and Family:    Attends Religious Services:    Active Member of Clubs or Organizations:    Attends Music therapist:    Marital Status:   Intimate Partner Violence:    Fear of Current or Ex-Partner:    Emotionally Abused:    Physically Abused:    Sexually Abused:     Family History  Problem Relation Age of Onset   Arthritis Mother    Depression Mother    Diabetes Mother    Hyperlipidemia Mother    Arthritis Father    Cancer Maternal Grandmother        breast   Diabetes Paternal Grandmother    Diabetes Paternal Grandfather    Hearing loss Paternal Grandfather    Cancer Maternal Aunt 67       Breast Cancer     Review of Systems  Constitutional: Positive for malaise/fatigue. Negative for chills and fever.  HENT: Negative.  Negative for congestion and sore throat.   Respiratory: Negative.  Negative for cough and shortness of breath.   Cardiovascular: Negative.  Negative for chest pain and palpitations.  Gastrointestinal: Negative.  Negative for abdominal pain, blood in stool, diarrhea, melena, nausea and vomiting.  Genitourinary: Negative.  Negative for dysuria and hematuria.  Musculoskeletal: Negative.  Negative for back pain, myalgias and neck pain.  Skin: Negative.  Negative for rash.  Neurological: Negative.  Negative for dizziness and headaches.  Endo/Heme/Allergies: Negative.   All other systems reviewed and are negative.  Today's Vitals   08/26/19 0846  BP: 123/71  Pulse: 85  Temp: 98 F (36.7 C)  TempSrc: Temporal  SpO2: 98%  Weight: 253 lb 12.8 oz (115.1 kg)  Height: 5\' 8"  (1.727 m)   Body mass index is  38.59 kg/m.   Physical Exam Vitals reviewed.  Constitutional:      Appearance: Normal appearance.  HENT:     Head: Normocephalic.  Eyes:     Extraocular Movements: Extraocular movements intact.     Conjunctiva/sclera: Conjunctivae normal.     Pupils: Pupils are equal, round, and reactive to light.  Cardiovascular:     Rate and Rhythm: Normal rate and regular rhythm.     Pulses: Normal pulses.     Heart sounds: Normal heart sounds.  Pulmonary:     Effort: Pulmonary effort is normal.     Breath sounds: Normal breath  sounds.  Musculoskeletal:        General: Normal range of motion.     Cervical back: Normal range of motion and neck supple.  Skin:    General: Skin is warm and dry.  Neurological:     General: No focal deficit present.     Mental Status: She is alert and oriented to person, place, and time.  Psychiatric:        Mood and Affect: Mood normal.        Behavior: Behavior normal.    Results for orders placed or performed in visit on 08/26/19 (from the past 24 hour(s))  POCT glucose (manual entry)     Status: Abnormal   Collection Time: 08/26/19  9:07 AM  Result Value Ref Range   POC Glucose 103 (A) 70 - 99 mg/dl  POCT glycosylated hemoglobin (Hb A1C)     Status: Abnormal   Collection Time: 08/26/19  9:13 AM  Result Value Ref Range   Hemoglobin A1C 6.3 (A) 4.0 - 5.6 %   HbA1c POC (<> result, manual entry)     HbA1c, POC (prediabetic range)     HbA1c, POC (controlled diabetic range)      A total of 30 minutes was spent with the patient, greater than 50% of which was in counseling/coordination of care regarding diabetes and cardiovascular risks associated with this condition, management including diet and nutrition, review of all medications, review of most recent office visit notes, review of most recent blood work results including today's hemoglobin A1c, prognosis and need for follow-up.   ASSESSMENT & PLAN: Dyslipidemia associated with type 2 diabetes  mellitus ([place]) Well-controlled diabetes with hemoglobin A1c of 6.3.  Continue present medications, diet and nutrition.  Patient doing very well. Follow-up in 6 months.  Laveta was seen today for diabetes.  Diagnoses and all orders for this visit:  Type 2 diabetes mellitus with hyperglycemia, without long-term current use of insulin (HCC) -     Comprehensive metabolic panel -     Lipid panel -     POCT glucose (manual entry) -     POCT glycosylated hemoglobin (Hb A1C)  Dyslipidemia associated with type 2 diabetes mellitus (H[facility])  PCOS (polycystic ovarian syndrome)  Morbid obesity ([place])    Patient Instructions       If you have lab work done today you will be contacted with your lab results within the next 2 weeks.  If you have not heard from [place] then please contact us. The fastest way to get your results is to register for My Chart.   IF you received an x-ray today, you will receive an invoice from [facility]. Please contact [facility] at [phone] with questions or concerns regarding your invoice.   IF you received labwork today, you will receive an invoice from [facility]. Please contact [facility] at [phone] with questions or concerns regarding your invoice.   Our billing staff will not be able to assist you with questions regarding bills from these companies.  You will be contacted with the lab results as soon as they are available. The fastest way to get your results is to activate your My Chart account. Instructions are located on the last page of this paperwork. If you have not heard from [place] regarding the results in 2 weeks, please contact this office.      Diabetes Mellitus and Nutrition, Adult When you have diabetes (diabetes mellitus), it is very important to have healthy eating habits because your blood  sugar (glucose) levels are greatly affected by what you eat and drink. Eating healthy foods in the appropriate amounts, at about the same  times every day, can help you:  Control your blood glucose.  Lower your risk of heart disease.  Improve your blood pressure.  Reach or maintain a healthy weight. Every person with diabetes is different, and each person has different needs for a meal plan. Your health care provider may recommend that you work with a diet and nutrition specialist (dietitian) to make a meal plan that is best for you. Your meal plan may vary depending on factors such as:  The calories you need.  The medicines you take.  Your weight.  Your blood glucose, blood pressure, and cholesterol levels.  Your activity level.  Other health conditions you have, such as heart or kidney disease. How do carbohydrates affect me? Carbohydrates, also called carbs, affect your blood glucose level more than any other type of food. Eating carbs naturally raises the amount of glucose in your blood. Carb counting is a method for keeping track of how many carbs you eat. Counting carbs is important to keep your blood glucose at a healthy level, especially if you use insulin or take certain oral diabetes medicines. It is important to know how many carbs you can safely have in each meal. This is different for every person. Your dietitian can help you calculate how many carbs you should have at each meal and for each snack. Foods that contain carbs include:  Bread, cereal, rice, pasta, and crackers.  Potatoes and corn.  Peas, beans, and lentils.  Milk and yogurt.  Fruit and juice.  Desserts, such as cakes, cookies, ice cream, and candy. How does alcohol affect me? Alcohol can cause a sudden decrease in blood glucose (hypoglycemia), especially if you use insulin or take certain oral diabetes medicines. Hypoglycemia can be a life-threatening condition. Symptoms of hypoglycemia (sleepiness, dizziness, and confusion) are similar to symptoms of having too much alcohol. If your health care provider says that alcohol is safe for  you, follow these guidelines:  Limit alcohol intake to no more than 1 drink per day for nonpregnant women and 2 drinks per day for men. One drink equals 12 oz of beer, 5 oz of wine, or 1 oz of hard liquor.  Do not drink on an empty stomach.  Keep yourself hydrated with water, diet soda, or unsweetened iced tea.  Keep in mind that regular soda, juice, and other mixers may contain a lot of sugar and must be counted as carbs. What are tips for following this plan?  Reading food labels  Start by checking the serving size on the "Nutrition Facts" label of packaged foods and drinks. The amount of calories, carbs, fats, and other nutrients listed on the label is based on one serving of the item. Many items contain more than one serving per package.  Check the total grams (g) of carbs in one serving. You can calculate the number of servings of carbs in one serving by dividing the total carbs by 15. For example, if a food has 30 g of total carbs, it would be equal to 2 servings of carbs.  Check the number of grams (g) of saturated and trans fats in one serving. Choose foods that have low or no amount of these fats.  Check the number of milligrams (mg) of salt (sodium) in one serving. Most people should limit total sodium intake to less than 2,300 mg per day.  Always check the nutrition information of foods labeled as "low-fat" or "nonfat". These foods may be higher in added sugar or refined carbs and should be avoided.  Talk to your dietitian to identify your daily goals for nutrients listed on the label. Shopping  Avoid buying canned, premade, or processed foods. These foods tend to be high in fat, sodium, and added sugar.  Shop around the outside edge of the grocery store. This includes fresh fruits and vegetables, bulk grains, fresh meats, and fresh dairy. Cooking  Use low-heat cooking methods, such as baking, instead of high-heat cooking methods like deep frying.  Cook using healthy  oils, such as olive, canola, or sunflower oil.  Avoid cooking with butter, cream, or high-fat meats. Meal planning  Eat meals and snacks regularly, preferably at the same times every day. Avoid going long periods of time without eating.  Eat foods high in fiber, such as fresh fruits, vegetables, beans, and whole grains. Talk to your dietitian about how many servings of carbs you can eat at each meal.  Eat 4-6 ounces (oz) of lean protein each day, such as lean meat, chicken, fish, eggs, or tofu. One oz of lean protein is equal to: ? 1 oz of meat, chicken, or fish. ? 1 egg. ?  cup of tofu.  Eat some foods each day that contain healthy fats, such as avocado, nuts, seeds, and fish. Lifestyle  Check your blood glucose regularly.  Exercise regularly as told by your health care provider. This may include: ? 150 minutes of moderate-intensity or vigorous-intensity exercise each week. This could be brisk walking, biking, or water aerobics. ? Stretching and doing strength exercises, such as yoga or weightlifting, at least 2 times a week.  Take medicines as told by your health care provider.  Do not use any products that contain nicotine or tobacco, such as cigarettes and e-cigarettes. If you need help quitting, ask your health care provider.  Work with a Social worker or diabetes educator to identify strategies to manage stress and any emotional and social challenges. Questions to ask a health care provider  Do I need to meet with a diabetes educator?  Do I need to meet with a dietitian?  What number can I call if I have questions?  When are the best times to check my blood glucose? Where to find more information:  American Diabetes Association: diabetes.org  Academy of Nutrition and Dietetics: www.eatright.[employer] of Diabetes and Digestive and Kidney Diseases (NIH): [employer] Summary  A healthy meal plan will help you control your blood glucose and maintain a  healthy lifestyle.  Working with a diet and nutrition specialist (dietitian) can help you make a meal plan that is best for you.  Keep in mind that carbohydrates (carbs) and alcohol have immediate effects on your blood glucose levels. It is important to count carbs and to use alcohol carefully. This information is not intended to replace advice given to you by your health care provider. Make sure you discuss any questions you have with your health care provider. Document Revised: 02/15/2017 Document Reviewed: 04/09/2016 Elsevier Patient Education  2020 Elsevier Inc.      Agustina Caroli, MD [facility]

## 2019-08-26 NOTE — Assessment & Plan Note (Signed)
Well-controlled diabetes with hemoglobin A1c of 6.3.  Continue present medications, diet and nutrition.  Patient doing very well. Follow-up in 6 months.

## 2019-08-26 NOTE — Patient Instructions (Addendum)
If you have lab work done today you will be contacted with your lab results within the next 2 weeks.  If you have not heard from [place] then please contact us. The fastest way to get your results is to register for My Chart.   IF you received an x-ray today, you will receive an invoice from [facility]. Please contact [facility] at [phone] with questions or concerns regarding your invoice.   IF you received labwork today, you will receive an invoice from [facility]. Please contact [facility] at [phone] with questions or concerns regarding your invoice.   Our billing staff will not be able to assist you with questions regarding bills from these companies.  You will be contacted with the lab results as soon as they are available. The fastest way to get your results is to activate your My Chart account. Instructions are located on the last page of this paperwork. If you have not heard from [place] regarding the results in 2 weeks, please contact this office.      Diabetes Mellitus and Nutrition, Adult When you have diabetes (diabetes mellitus), it is very important to have healthy eating habits because your blood sugar (glucose) levels are greatly affected by what you eat and drink. Eating healthy foods in the appropriate amounts, at about the same times every day, can help you:  Control your blood glucose.  Lower your risk of heart disease.  Improve your blood pressure.  Reach or maintain a healthy weight. Every person with diabetes is different, and each person has different needs for a meal plan. Your health care provider may recommend that you work with a diet and nutrition specialist (dietitian) to make a meal plan that is best for you. Your meal plan may vary depending on factors such as:  The calories you need.  The medicines you take.  Your weight.  Your blood glucose, blood pressure, and cholesterol levels.  Your activity level.  Other health  conditions you have, such as heart or kidney disease. How do carbohydrates affect me? Carbohydrates, also called carbs, affect your blood glucose level more than any other type of food. Eating carbs naturally raises the amount of glucose in your blood. Carb counting is a method for keeping track of how many carbs you eat. Counting carbs is important to keep your blood glucose at a healthy level, especially if you use insulin or take certain oral diabetes medicines. It is important to know how many carbs you can safely have in each meal. This is different for every person. Your dietitian can help you calculate how many carbs you should have at each meal and for each snack. Foods that contain carbs include:  Bread, cereal, rice, pasta, and crackers.  Potatoes and corn.  Peas, beans, and lentils.  Milk and yogurt.  Fruit and juice.  Desserts, such as cakes, cookies, ice cream, and candy. How does alcohol affect me? Alcohol can cause a sudden decrease in blood glucose (hypoglycemia), especially if you use insulin or take certain oral diabetes medicines. Hypoglycemia can be a life-threatening condition. Symptoms of hypoglycemia (sleepiness, dizziness, and confusion) are similar to symptoms of having too much alcohol. If your health care provider says that alcohol is safe for you, follow these guidelines:  Limit alcohol intake to no more than 1 drink per day for nonpregnant women and 2 drinks per day for men. One drink equals 12 oz of beer, 5 oz of wine, or 1 oz of hard  liquor.  Do not drink on an empty stomach.  Keep yourself hydrated with water, diet soda, or unsweetened iced tea.  Keep in mind that regular soda, juice, and other mixers may contain a lot of sugar and must be counted as carbs. What are tips for following this plan?  Reading food labels  Start by checking the serving size on the "Nutrition Facts" label of packaged foods and drinks. The amount of calories, carbs, fats, and  other nutrients listed on the label is based on one serving of the item. Many items contain more than one serving per package.  Check the total grams (g) of carbs in one serving. You can calculate the number of servings of carbs in one serving by dividing the total carbs by 15. For example, if a food has 30 g of total carbs, it would be equal to 2 servings of carbs.  Check the number of grams (g) of saturated and trans fats in one serving. Choose foods that have low or no amount of these fats.  Check the number of milligrams (mg) of salt (sodium) in one serving. Most people should limit total sodium intake to less than 2,300 mg per day.  Always check the nutrition information of foods labeled as "low-fat" or "nonfat". These foods may be higher in added sugar or refined carbs and should be avoided.  Talk to your dietitian to identify your daily goals for nutrients listed on the label. Shopping  Avoid buying canned, premade, or processed foods. These foods tend to be high in fat, sodium, and added sugar.  Shop around the outside edge of the grocery store. This includes fresh fruits and vegetables, bulk grains, fresh meats, and fresh dairy. Cooking  Use low-heat cooking methods, such as baking, instead of high-heat cooking methods like deep frying.  Cook using healthy oils, such as olive, canola, or sunflower oil.  Avoid cooking with butter, cream, or high-fat meats. Meal planning  Eat meals and snacks regularly, preferably at the same times every day. Avoid going long periods of time without eating.  Eat foods high in fiber, such as fresh fruits, vegetables, beans, and whole grains. Talk to your dietitian about how many servings of carbs you can eat at each meal.  Eat 4-6 ounces (oz) of lean protein each day, such as lean meat, chicken, fish, eggs, or tofu. One oz of lean protein is equal to: ? 1 oz of meat, chicken, or fish. ? 1 egg. ?  cup of tofu.  Eat some foods each day that  contain healthy fats, such as avocado, nuts, seeds, and fish. Lifestyle  Check your blood glucose regularly.  Exercise regularly as told by your health care provider. This may include: ? 150 minutes of moderate-intensity or vigorous-intensity exercise each week. This could be brisk walking, biking, or water aerobics. ? Stretching and doing strength exercises, such as yoga or weightlifting, at least 2 times a week.  Take medicines as told by your health care provider.  Do not use any products that contain nicotine or tobacco, such as cigarettes and e-cigarettes. If you need help quitting, ask your health care provider.  Work with a Social worker or diabetes educator to identify strategies to manage stress and any emotional and social challenges. Questions to ask a health care provider  Do I need to meet with a diabetes educator?  Do I need to meet with a dietitian?  What number can I call if I have questions?  When are the best  times to check my blood glucose? Where to find more information:  American Diabetes Association: diabetes.org  Academy of Nutrition and Dietetics: www.eatright.[employer] of Diabetes and Digestive and Kidney Diseases (NIH): [employer] Summary  A healthy meal plan will help you control your blood glucose and maintain a healthy lifestyle.  Working with a diet and nutrition specialist (dietitian) can help you make a meal plan that is best for you.  Keep in mind that carbohydrates (carbs) and alcohol have immediate effects on your blood glucose levels. It is important to count carbs and to use alcohol carefully. This information is not intended to replace advice given to you by your health care provider. Make sure you discuss any questions you have with your health care provider. Document Revised: 02/15/2017 Document Reviewed: 04/09/2016 Elsevier Patient Education  2020 [employer].

## 2019-08-27 LAB — COMPREHENSIVE METABOLIC PANEL
ALT: 11 IU/L (ref 0–32)
AST: 12 IU/L (ref 0–40)
Albumin/Globulin Ratio: 1 — ABNORMAL LOW (ref 1.2–2.2)
Albumin: 4.3 g/dL (ref 3.8–4.8)
Alkaline Phosphatase: 64 IU/L (ref 48–121)
BUN/Creatinine Ratio: 10 (ref 9–23)
BUN: 8 mg/dL (ref 6–20)
Bilirubin Total: 0.3 mg/dL (ref 0.0–1.2)
CO2: 23 mmol/L (ref 20–29)
Calcium: 9.4 mg/dL (ref 8.7–10.2)
Chloride: 102 mmol/L (ref 96–106)
Creatinine, Ser: 0.77 mg/dL (ref 0.57–1.00)
GFR calc Af Amer: 117 mL/min/{1.73_m2} (ref >59)
GFR calc non Af Amer: 102 mL/min/{1.73_m2} (ref >59)
Globulin, Total: 4.1 g/dL (ref 1.5–4.5)
Glucose: 103 mg/dL — ABNORMAL HIGH (ref 65–99)
Potassium: 4.1 mmol/L (ref 3.5–5.2)
Sodium: 138 mmol/L (ref 134–144)
Total Protein: 8.4 g/dL (ref 6.0–8.5)

## 2019-08-27 LAB — LIPID PANEL
Chol/HDL Ratio: 4.1 ratio (ref 0.0–4.4)
Cholesterol, Total: 189 mg/dL (ref 100–199)
HDL: 46 mg/dL (ref >39)
LDL Chol Calc (NIH): 127 mg/dL — ABNORMAL HIGH (ref 0–99)
Triglycerides: 90 mg/dL (ref 0–149)
VLDL Cholesterol Cal: 16 mg/dL (ref 5–40)

## 2019-09-03 ENCOUNTER — Other Ambulatory Visit: Payer: Self-pay

## 2019-09-03 DIAGNOSIS — E1165 Type 2 diabetes mellitus with hyperglycemia: Secondary | ICD-10-CM

## 2019-09-03 MED ORDER — METFORMIN HCL 500 MG PO TABS: ORAL_TABLET | ORAL | 3 refills | Status: DC

## 2019-09-04 ENCOUNTER — Telehealth: Payer: Self-pay | Admitting: Emergency Medicine

## 2019-09-04 NOTE — Telephone Encounter (Signed)
Pharmacy called for clarity/ Pt has stated to them that she takes her metFORMIN (GLUCOPHAGE) 500 MG tablet  4 times daily / please advise

## 2019-09-07 ENCOUNTER — Other Ambulatory Visit: Payer: Self-pay

## 2019-09-07 DIAGNOSIS — E1165 Type 2 diabetes mellitus with hyperglycemia: Secondary | ICD-10-CM

## 2019-09-07 MED ORDER — METFORMIN HCL 500 MG PO TABS: 1000.0000 mg | ORAL_TABLET | Freq: Two times a day (BID) | ORAL | 3 refills | Status: DC

## 2019-09-07 MED FILL — METFORMIN HCL 500 MG TABS: 500 | 90 days supply | Qty: 360 | Fill #0

## 2019-09-07 NOTE — Telephone Encounter (Signed)
Patient checking on the status of message below, stating its been a week without her diabetes medication.  Please call regarding status best # [phone]

## 2019-09-07 NOTE — Telephone Encounter (Signed)
Contacted pharmacy regarding Metformin; spoke with Velta Addison, Pharmacist; she states, per the pt, she is to take metformin 500 mg 4 times per day; confirmed with Maryann the last office visit note from 09/03/19 reads: "TAKE 1 TABLET(500 MG) BY MOUTH TWICE DAILY WITH A MEAL"; Maryann states this is the order that the pharmacy received; Velta Addison will contact the pt, and make her aware; she sees Dr Mitchel Honour, [facility]; also see [facility] [phone], and telephone encounter dated 09/04/19; will route to office for notification.

## 2019-09-07 NOTE — Telephone Encounter (Signed)
escalated this request to [facility] in clinical . Lexine Baton will resend and contact patient .

## 2019-09-07 NOTE — Telephone Encounter (Signed)
Copied from [place] [id number]. Topic: General - Other >> Sep 04, 2019  2:08 PM Rainey Pines A wrote: Patient is asking for a callback from nurse in regards to her metformin medication. Patient stated that the pharmacy needs to correct information in regards to her dosage and directions on medication in order for them to release her refill. Please advise

## 2019-10-20 DIAGNOSIS — Z111 Encounter for screening for respiratory tuberculosis: Secondary | ICD-10-CM | POA: Diagnosis not present

## 2019-10-29 ENCOUNTER — Telehealth: Payer: 59 | Admitting: Emergency Medicine

## 2019-10-29 DIAGNOSIS — N760 Acute vaginitis: Secondary | ICD-10-CM | POA: Diagnosis not present

## 2019-10-29 MED ORDER — FLUCONAZOLE 150 MG PO TABS
150.0000 mg | ORAL_TABLET | Freq: Once | ORAL | 0 refills | Status: AC
Start: 2019-10-29 — End: 2019-10-29

## 2019-10-29 MED FILL — EZETIMIBE 10 MG TABS: 10 | 90 days supply | Qty: 90 | Fill #2

## 2019-10-29 MED FILL — FLUCONAZOLE 150 MG TABS: 150 | 1 days supply | Qty: 1 | Fill #0

## 2019-10-29 NOTE — Progress Notes (Signed)
We are sorry that you are not feeling well. Here is how we plan to help! Based on what you shared with me it looks like you: May have a yeast vaginosis  Vaginosis is an inflammation of the vagina that can result in discharge, itching and pain. The cause is usually a change in the normal balance of vaginal bacteria or an infection. Vaginosis can also result from reduced estrogen levels after menopause.  The most common causes of vaginosis are:   Bacterial vaginosis which results from an overgrowth of one on several organisms that are normally present in your vagina.   Yeast infections which are caused by a naturally occurring fungus called candida.   Vaginal atrophy (atrophic vaginosis) which results from the thinning of the vagina from reduced estrogen levels after menopause.   Trichomoniasis which is caused by a parasite and is commonly transmitted by sexual intercourse.  Factors that increase your risk of developing vaginosis include: . Medications, such as antibiotics and steroids . Uncontrolled diabetes . Use of hygiene products such as bubble bath, vaginal spray or vaginal deodorant . Douching . Wearing damp or tight-fitting clothing . Using an intrauterine device (IUD) for birth control . Hormonal changes, such as those associated with pregnancy, birth control pills or menopause . Sexual activity . Having a sexually transmitted infection  Your treatment plan is A single Diflucan (fluconazole) 150mg tablet once.  I have electronically sent this prescription into the pharmacy that you have chosen.  Be sure to take all of the medication as directed. Stop taking any medication if you develop a rash, tongue swelling or shortness of breath. Mothers who are breast feeding should consider pumping and discarding their breast milk while on these antibiotics. However, there is no consensus that infant exposure at these doses would be harmful.  Remember that medication creams can weaken latex  condoms. .   HOME CARE:  Good hygiene may prevent some types of vaginosis from recurring and may relieve some symptoms:  . Avoid baths, hot tubs and whirlpool spas. Rinse soap from your outer genital area after a shower, and dry the area well to prevent irritation. Don't use scented or harsh soaps, such as those with deodorant or antibacterial action. . Avoid irritants. These include scented tampons and pads. . Wipe from front to back after using the toilet. Doing so avoids spreading fecal bacteria to your vagina.  Other things that may help prevent vaginosis include:  . Don't douche. Your vagina doesn't require cleansing other than normal bathing. Repetitive douching disrupts the normal organisms that reside in the vagina and can actually increase your risk of vaginal infection. Douching won't clear up a vaginal infection. . Use a latex condom. Both female and female latex condoms may help you avoid infections spread by sexual contact. . Wear cotton underwear. Also wear pantyhose with a cotton crotch. If you feel comfortable without it, skip wearing underwear to bed. Yeast thrives in moist environments Your symptoms should improve in the next day or two.  GET HELP RIGHT AWAY IF:  . You have pain in your lower abdomen ( pelvic area or over your ovaries) . You develop nausea or vomiting . You develop a fever . Your discharge changes or worsens . You have persistent pain with intercourse . You develop shortness of breath, a rapid pulse, or you faint.  These symptoms could be signs of problems or infections that need to be evaluated by a medical provider now.  MAKE SURE YOU      Understand these instructions.  Will watch your condition.  Will get help right away if you are not doing well or get worse.  Your e-visit answers were reviewed by a board certified advanced clinical practitioner to complete your personal care plan. Depending upon the condition, your plan could have included  both over the counter or prescription medications. Please review your pharmacy choice to make sure that you have choses a pharmacy that is open for you to pick up any needed prescription, Your safety is important to [place]. If you have drug allergies check your prescription carefully.   You can use MyChart to ask questions about todays visit, request a non-urgent call back, or ask for a work or school excuse for 24 hours related to this e-Visit. If it has been greater than 24 hours you will need to follow up with your provider, or enter a new e-Visit to address those concerns. You will get a MyChart message within the next two days asking about your experience. I hope that your e-visit has been valuable and will speed your recovery. **Please do not respond to this message unless you have follow up questions.** Greater than 5 but less than 10 minutes spent researching, coordinating, and implementing care for this patient today   **Please do not respond to this message unless you have follow up questions.** Greater than 5 but less than 10 minutes spent researching, coordinating, and implementing care for this patient today

## 2019-11-02 ENCOUNTER — Other Ambulatory Visit: Payer: Self-pay | Admitting: Emergency Medicine

## 2019-11-02 ENCOUNTER — Other Ambulatory Visit: Payer: Self-pay

## 2019-11-02 DIAGNOSIS — E1165 Type 2 diabetes mellitus with hyperglycemia: Secondary | ICD-10-CM

## 2019-11-02 MED ORDER — OZEMPIC (0.25 OR 0.5 MG/DOSE) 2 MG/1.5ML ~~LOC~~ SOPN: 0.5000 mg | PEN_INJECTOR | SUBCUTANEOUS | 2 refills | Status: DC

## 2019-11-02 MED FILL — OZEMPIC 0.25 OR 0.5 MG/DOSE: 2 | 84 days supply | Qty: 5 | Fill #0

## 2019-11-02 NOTE — Telephone Encounter (Signed)
Medication Refill - Medication: diflucan, ozempic    Has the patient contacted their pharmacy? Yes.  Pt states that she is on a medication that causes her to have yeast infections. Pt is requesting to have a 3 month supply due to cost. Please advise . (Agent: If no, request that the patient contact the pharmacy for the refill.) (Agent: If yes, when and what did the pharmacy advise?)  Preferred Pharmacy (with phone number or street name):  [facility] [place].  [id number] [place] [address]  Phone: [phone] Fax: [phone]  Hours: Not open 24 hours     Agent: Please be advised that RX refills may take up to 3 business days. We ask that you follow-up with your pharmacy.

## 2019-11-03 ENCOUNTER — Telehealth: Payer: 59 | Admitting: Nurse Practitioner

## 2019-11-03 ENCOUNTER — Telehealth: Payer: Self-pay | Admitting: Emergency Medicine

## 2019-11-03 DIAGNOSIS — N760 Acute vaginitis: Secondary | ICD-10-CM

## 2019-11-03 MED ORDER — FLUCONAZOLE 150 MG PO TABS: 150.0000 mg | ORAL_TABLET | Freq: Once | ORAL | 0 refills | Status: AC

## 2019-11-03 NOTE — Progress Notes (Signed)
We are sorry that you are not feeling well. Here is how we plan to help! Based on what you shared with me it looks like you: May have a yeast vaginosis   * according to your chart you were just treated for this 5 days ago. I realize some times you need to be treated more then once for it to resolve. I will treat with another diflucan. However if this does not clear things you , you will need a face to face visit.   Vaginosis is an inflammation of the vagina that can result in discharge, itching and pain. The cause is usually a change in the normal balance of vaginal bacteria or an infection. Vaginosis can also result from reduced estrogen levels after menopause.  The most common causes of vaginosis are:   Bacterial vaginosis which results from an overgrowth of one on several organisms that are normally present in your vagina.   Yeast infections which are caused by a naturally occurring fungus called candida.   Vaginal atrophy (atrophic vaginosis) which results from the thinning of the vagina from reduced estrogen levels after menopause.   Trichomoniasis which is caused by a parasite and is commonly transmitted by sexual intercourse.  Factors that increase your risk of developing vaginosis include: Marland Kitchen Medications, such as antibiotics and steroids . Uncontrolled diabetes . Use of hygiene products such as bubble bath, vaginal spray or vaginal deodorant . Douching . Wearing damp or tight-fitting clothing . Using an intrauterine device (IUD) for birth control . Hormonal changes, such as those associated with pregnancy, birth control pills or menopause . Sexual activity . Having a sexually transmitted infection  Your treatment plan is A single Diflucan (fluconazole) 150mg  tablet once.  I have electronically sent this prescription into the pharmacy that you have chosen.  Be sure to take all of the medication as directed. Stop taking any medication if you develop a rash, tongue swelling or  shortness of breath. Mothers who are breast feeding should consider pumping and discarding their breast milk while on these antibiotics. However, there is no consensus that infant exposure at these doses would be harmful.  Remember that medication creams can weaken latex condoms. Marland Kitchen   HOME CARE:  Good hygiene may prevent some types of vaginosis from recurring and may relieve some symptoms:  . Avoid baths, hot tubs and whirlpool spas. Rinse soap from your outer genital area after a shower, and dry the area well to prevent irritation. Don't use scented or harsh soaps, such as those with deodorant or antibacterial action. [place] Avoid irritants. These include scented tampons and pads. . Wipe from front to back after using the toilet. Doing so avoids spreading fecal bacteria to your vagina.  Other things that may help prevent vaginosis include:  Marland Kitchen Don't douche. Your vagina doesn't require cleansing other than normal bathing. Repetitive douching disrupts the normal organisms that reside in the vagina and can actually increase your risk of vaginal infection. Douching won't clear up a vaginal infection. . Use a latex condom. Both female and female latex condoms may help you avoid infections spread by sexual contact. . Wear cotton underwear. Also wear pantyhose with a cotton crotch. If you feel comfortable without it, skip wearing underwear to bed. Yeast thrives in [employer] Your symptoms should improve in the next day or two.  GET HELP RIGHT AWAY IF:  . You have pain in your lower abdomen ( pelvic area or over your ovaries) . You develop nausea or vomiting .  You develop a fever . Your discharge changes or worsens . You have persistent pain with intercourse . You develop shortness of breath, a rapid pulse, or you faint.  These symptoms could be signs of problems or infections that need to be evaluated by a medical provider now.  MAKE SURE YOU    Understand these instructions.  Will watch  your condition.  Will get help right away if you are not doing well or get worse.  Your e-visit answers were reviewed by a board certified advanced clinical practitioner to complete your personal care plan. Depending upon the condition, your plan could have included both over the counter or prescription medications. Please review your pharmacy choice to make sure that you have choses a pharmacy that is open for you to pick up any needed prescription, Your safety is important to [place]. If you have drug allergies check your prescription carefully.   You can use MyChart to ask questions about today's visit, request a non-urgent call back, or ask for a work or school excuse for 24 hours related to this e-Visit. If it has been greater than 24 hours you will need to follow up with your provider, or enter a new e-Visit to address those concerns. You will get a MyChart message within the next two days asking about your experience. I hope that your e-visit has been valuable and will speed your recovery. 5-10 minutes spent reviewing and documenting in chart.

## 2019-11-03 NOTE — Telephone Encounter (Signed)
Patient requesting diflucan due to experiencing yeast infection from reaction from medication. Patient states she needs medication sent today and would like confim # [phone]

## 2019-11-03 NOTE — Telephone Encounter (Signed)
Pt states that this should be on her med list for  Diflucan or generic / because she is on certain med farxigi  medicine which causes yeast infection .pt states  Last time she  spoke with provider she was told if she ever needed it  Just to call us    Please advise

## 2019-11-03 NOTE — Telephone Encounter (Signed)
Routing message to [place] regarding diflucan

## 2019-11-04 ENCOUNTER — Other Ambulatory Visit: Payer: Self-pay | Admitting: Emergency Medicine

## 2019-11-04 MED ORDER — FLUCONAZOLE 150 MG PO TABS
150.0000 mg | ORAL_TABLET | Freq: Once | ORAL | 0 refills | Status: AC
Start: 2019-11-04 — End: 2019-11-04

## 2019-11-04 MED FILL — FLUCONAZOLE 150 MG TABS: 150 | 1 days supply | Qty: 1 | Fill #0

## 2019-11-04 NOTE — Telephone Encounter (Signed)
Called and informed patient that the prescription was sent to the pharmacy

## 2019-11-04 NOTE — Telephone Encounter (Signed)
Prescription for Diflucan sent.  Thanks.

## 2019-11-04 NOTE — Telephone Encounter (Signed)
Patient states that when she was here last visit she discussed that a medication prescribed causes yeast infections and she was advised to call for some Diflucan if this occurs.  Please Advise

## 2019-11-05 MED FILL — FLUCONAZOLE 150 MG TABS: 150 | 1 days supply | Qty: 1 | Fill #0

## 2019-11-18 MED FILL — FLUCONAZOLE 150 MG TABS: 150 | 1 days supply | Qty: 1 | Fill #0

## 2019-11-25 ENCOUNTER — Ambulatory Visit: Payer: 59 | Admitting: Emergency Medicine

## 2019-11-27 ENCOUNTER — Other Ambulatory Visit: Payer: Self-pay | Admitting: Critical Care Medicine

## 2019-11-27 ENCOUNTER — Other Ambulatory Visit: Payer: Self-pay

## 2019-11-27 DIAGNOSIS — Z20822 Contact with and (suspected) exposure to covid-19: Secondary | ICD-10-CM

## 2019-11-30 LAB — NOVEL CORONAVIRUS, NAA: SARS-CoV-2, NAA: NOT DETECTED

## 2019-12-29 ENCOUNTER — Ambulatory Visit: Payer: 59 | Admitting: Emergency Medicine

## 2020-01-04 MED FILL — FARXIGA 5 MG TABLET: 5 | 30 days supply | Qty: 30 | Fill #2

## 2020-01-04 MED FILL — EZETIMIBE 10 MG TABS: 10 | 30 days supply | Qty: 30 | Fill #3

## 2020-01-04 MED FILL — METFORMIN HCL 500 MG TABS: 500 | 30 days supply | Qty: 120 | Fill #1

## 2020-02-04 ENCOUNTER — Ambulatory Visit (INDEPENDENT_AMBULATORY_CARE_PROVIDER_SITE_OTHER): Payer: 59 | Admitting: Emergency Medicine

## 2020-02-04 ENCOUNTER — Encounter: Payer: Self-pay | Admitting: Emergency Medicine

## 2020-02-04 ENCOUNTER — Other Ambulatory Visit: Payer: Self-pay

## 2020-02-04 ENCOUNTER — Other Ambulatory Visit: Payer: Self-pay | Admitting: Emergency Medicine

## 2020-02-04 VITALS — BP 119/75 | HR 87 | Temp 98.5°F | Resp 16 | Ht 67.0 in | Wt 251.0 lb

## 2020-02-04 DIAGNOSIS — Z6839 Body mass index (BMI) 39.0-39.9, adult: Secondary | ICD-10-CM

## 2020-02-04 DIAGNOSIS — E785 Hyperlipidemia, unspecified: Secondary | ICD-10-CM | POA: Diagnosis not present

## 2020-02-04 DIAGNOSIS — Z789 Other specified health status: Secondary | ICD-10-CM | POA: Diagnosis not present

## 2020-02-04 DIAGNOSIS — E1165 Type 2 diabetes mellitus with hyperglycemia: Secondary | ICD-10-CM

## 2020-02-04 DIAGNOSIS — E282 Polycystic ovarian syndrome: Secondary | ICD-10-CM

## 2020-02-04 DIAGNOSIS — E1169 Type 2 diabetes mellitus with other specified complication: Secondary | ICD-10-CM | POA: Diagnosis not present

## 2020-02-04 LAB — POCT GLYCOSYLATED HEMOGLOBIN (HGB A1C): Hemoglobin A1C: 6.2 % — AB (ref 4.0–5.6)

## 2020-02-04 LAB — GLUCOSE, POCT (MANUAL RESULT ENTRY): POC Glucose: 94 mg/dl (ref 70–99)

## 2020-02-04 MED ORDER — DAPAGLIFLOZIN PROPANEDIOL 5 MG PO TABS: 5.0000 mg | ORAL_TABLET | Freq: Every day | ORAL | 3 refills | Status: DC

## 2020-02-04 MED ORDER — METFORMIN HCL 500 MG PO TABS: 1000.0000 mg | ORAL_TABLET | Freq: Two times a day (BID) | ORAL | 3 refills | Status: DC

## 2020-02-04 MED ORDER — EZETIMIBE 10 MG PO TABS: 10.0000 mg | ORAL_TABLET | Freq: Every day | ORAL | 3 refills | Status: DC

## 2020-02-04 MED FILL — METFORMIN HCL 500 MG TABS: 500 | 30 days supply | Qty: 120 | Fill #0

## 2020-02-04 MED FILL — FARXIGA 5 MG TABLET: 5 | 30 days supply | Qty: 30 | Fill #0

## 2020-02-04 MED FILL — EZETIMIBE 10 MG TABS: 10 | 30 days supply | Qty: 30 | Fill #0

## 2020-02-04 NOTE — Patient Instructions (Addendum)
   If you have lab work done today you will be contacted with your lab results within the next 2 weeks.  If you have not heard from us then please contact us. The fastest way to get your results is to register for My Chart.   IF you received an x-ray today, you will receive an invoice from [facility]. Please contact  Radiology at [phone] with questions or concerns regarding your invoice.   IF you received labwork today, you will receive an invoice from [facility]. Please contact [facility] at [phone] with questions or concerns regarding your invoice.   Our billing staff will not be able to assist you with questions regarding bills from these companies.  You will be contacted with the lab results as soon as they are available. The fastest way to get your results is to activate your My Chart account. Instructions are located on the last page of this paperwork. If you have not heard from us regarding the results in 2 weeks, please contact this office.     Diabetes Mellitus and Nutrition, Adult When you have diabetes (diabetes mellitus), it is very important to have healthy eating habits because your blood sugar (glucose) levels are greatly affected by what you eat and drink. Eating healthy foods in the appropriate amounts, at about the same times every day, can help you:  Control your blood glucose.  Lower your risk of heart disease.  Improve your blood pressure.  Reach or maintain a healthy weight. Every person with diabetes is different, and each person has different needs for a meal plan. Your health care provider may recommend that you work with a diet and nutrition specialist (dietitian) to make a meal plan that is best for you. Your meal plan may vary depending on factors such as:  The calories you need.  The medicines you take.  Your weight.  Your blood glucose, blood pressure, and cholesterol levels.  Your activity level.  Other health conditions  you have, such as heart or kidney disease. How do carbohydrates affect me? Carbohydrates, also called carbs, affect your blood glucose level more than any other type of food. Eating carbs naturally raises the amount of glucose in your blood. Carb counting is a method for keeping track of how many carbs you eat. Counting carbs is important to keep your blood glucose at a healthy level, especially if you use insulin or take certain oral diabetes medicines. It is important to know how many carbs you can safely have in each meal. This is different for every person. Your dietitian can help you calculate how many carbs you should have at each meal and for each snack. Foods that contain carbs include:  Bread, cereal, rice, pasta, and crackers.  Potatoes and corn.  Peas, beans, and lentils.  Milk and yogurt.  Fruit and juice.  Desserts, such as cakes, cookies, ice cream, and candy. How does alcohol affect me? Alcohol can cause a sudden decrease in blood glucose (hypoglycemia), especially if you use insulin or take certain oral diabetes medicines. Hypoglycemia can be a life-threatening condition. Symptoms of hypoglycemia (sleepiness, dizziness, and confusion) are similar to symptoms of having too much alcohol. If your health care provider says that alcohol is safe for you, follow these guidelines:  Limit alcohol intake to no more than 1 drink per day for nonpregnant women and 2 drinks per day for men. One drink equals 12 oz of beer, 5 oz of wine, or 1 oz of hard liquor.    Do not drink on an empty stomach.  Keep yourself hydrated with water, diet soda, or unsweetened iced tea.  Keep in mind that regular soda, juice, and other mixers may contain a lot of sugar and must be counted as carbs. What are tips for following this plan?  Reading food labels  Start by checking the serving size on the "Nutrition Facts" label of packaged foods and drinks. The amount of calories, carbs, fats, and other  nutrients listed on the label is based on one serving of the item. Many items contain more than one serving per package.  Check the total grams (g) of carbs in one serving. You can calculate the number of servings of carbs in one serving by dividing the total carbs by 15. For example, if a food has 30 g of total carbs, it would be equal to 2 servings of carbs.  Check the number of grams (g) of saturated and trans fats in one serving. Choose foods that have low or no amount of these fats.  Check the number of milligrams (mg) of salt (sodium) in one serving. Most people should limit total sodium intake to less than 2,300 mg per day.  Always check the nutrition information of foods labeled as "low-fat" or "nonfat". These foods may be higher in added sugar or refined carbs and should be avoided.  Talk to your dietitian to identify your daily goals for nutrients listed on the label. Shopping  Avoid buying canned, premade, or processed foods. These foods tend to be high in fat, sodium, and added sugar.  Shop around the outside edge of the grocery store. This includes fresh fruits and vegetables, bulk grains, fresh meats, and fresh dairy. Cooking  Use low-heat cooking methods, such as baking, instead of high-heat cooking methods like deep frying.  Cook using healthy oils, such as olive, canola, or sunflower oil.  Avoid cooking with butter, cream, or high-fat meats. Meal planning  Eat meals and snacks regularly, preferably at the same times every day. Avoid going long periods of time without eating.  Eat foods high in fiber, such as fresh fruits, vegetables, beans, and whole grains. Talk to your dietitian about how many servings of carbs you can eat at each meal.  Eat 4-6 ounces (oz) of lean protein each day, such as lean meat, chicken, fish, eggs, or tofu. One oz of lean protein is equal to: ? 1 oz of meat, chicken, or fish. ? 1 egg. ?  cup of tofu.  Eat some foods each day that contain  healthy fats, such as avocado, nuts, seeds, and fish. Lifestyle  Check your blood glucose regularly.  Exercise regularly as told by your health care provider. This may include: ? 150 minutes of moderate-intensity or vigorous-intensity exercise each week. This could be brisk walking, biking, or water aerobics. ? Stretching and doing strength exercises, such as yoga or weightlifting, at least 2 times a week.  Take medicines as told by your health care provider.  Do not use any products that contain nicotine or tobacco, such as cigarettes and e-cigarettes. If you need help quitting, ask your health care provider.  Work with a counselor or diabetes educator to identify strategies to manage stress and any emotional and social challenges. Questions to ask a health care provider  Do I need to meet with a diabetes educator?  Do I need to meet with a dietitian?  What number can I call if I have questions?  When are the best times to   times to check my blood glucose? Where to find more information:  American Diabetes Association: diabetes.org  Academy of Nutrition and Dietetics: www.eatright.org  National Institute of Diabetes and Digestive and Kidney Diseases (NIH): www.niddk.nih.gov Summary  A healthy meal plan will help you control your blood glucose and maintain a healthy lifestyle.  Working with a diet and nutrition specialist (dietitian) can help you make a meal plan that is best for you.  Keep in mind that carbohydrates (carbs) and alcohol have immediate effects on your blood glucose levels. It is important to count carbs and to use alcohol carefully. This information is not intended to replace advice given to you by your health care provider. Make sure you discuss any questions you have with your health care provider. Document Revised: 02/15/2017 Document Reviewed: 04/09/2016 Elsevier Patient Education  2020 [employer]  

## 2020-02-04 NOTE — Assessment & Plan Note (Signed)
Well-controlled diabetes with hemoglobin A1c at 6.2.  Continue Metformin, Ozempic, and [place].  Diet and nutrition discussed. Follow-up in 3 months.

## 2020-02-04 NOTE — Progress Notes (Signed)
Maria Maddox 34 y.o.   Chief Complaint  Patient presents with  . Diabetes    follow up 3 month  . Medication Refill    pend    HISTORY OF PRESENT ILLNESS: This is a 34 y.o. female with history of diabetes and dyslipidemia here for follow-up. #1 diabetes: On Ozempic, Metformin, and [place].  Doing well.  Has lost 20 pounds since March. #2 dyslipidemia: On Zetia 10 mg daily. No complaints or medical concerns today. Fully vaccinated against Covid. Has a question about Metformin frequency.  Has a new job and prefers to take it once a day. Hemoglobin A1c at 6.2 today.  Advised to take Metformin 1000 mg once a day.  This suits her working schedule much better.  HPI   Prior to Admission medications   Medication Sig Start Date End Date Taking? Authorizing Provider  AFLURIA QUADRIVALENT 0.5 ML injection  01/19/19  Yes [provider]  atropine 1 % ophthalmic solution as needed.   Yes [provider]  brimonidine (ALPHAGAN P) 0.1 % SOLN Place 1 drop into both eyes 2 (two) times daily.   Yes [provider]  dapagliflozin propanediol (FARXIGA) 5 MG TABS tablet Take 5 mg by mouth daily.   Yes [provider]  dorzolamide-timolol (COSOPT) 22.3-6.8 MG/ML ophthalmic solution Place 1 drop into the right eye daily.   Yes [provider]  ezetimibe (ZETIA) 10 MG tablet Take 1 tablet (10 mg total) by mouth daily. 03/23/19  Yes [place], Ines Bloomer, MD  timolol (TIMOPTIC) 0.25 % ophthalmic solution  04/23/19  Yes [provider]  metFORMIN (GLUCOPHAGE) 500 MG tablet TAKE 1 TABLET(500 MG) BY MOUTH TWICE DAILY WITH A MEAL Patient not taking: Reported on 02/04/2020 09/03/19   Horald Pollen, MD  metFORMIN (GLUCOPHAGE) 500 MG tablet Take 2 tablets (1,000 mg total) by mouth 2 (two) times daily with a meal. Patient not taking: Reported on 02/04/2020 09/07/19   Horald Pollen, MD    Allergies  Allergen Reactions  . Crestor [Rosuvastatin  Calcium] Other (See Comments)    Per patient body pain    Patient Active Problem List   Diagnosis Date Noted  . Dyslipidemia associated with type 2 diabetes mellitus ([place]) 10/14/2016  . Morbid obesity ([place]) 01/15/2013  . PCOS (polycystic ovarian syndrome) 01/15/2013    Past Medical History:  Diagnosis Date  . Eczema   . Glaucoma    has had for 10 yrears  . Legally blind in right eye, as defined in [place]   . PCOS (polycystic ovarian syndrome)     Past Surgical History:  Procedure Laterality Date  . EYE SURGERY     right side, numerous surgeries (now blind)  . PILONIDAL CYST EXCISION N/A 10/26/2015   Procedure: CYST EXCISION PILONIDAL;  Surgeon: Coralie Keens, MD;  Location: [facility];  Service: General;  Laterality: N/A;    Social History   Socioeconomic History  . Marital status: Married    Spouse name: Not on file  . Number of children: Not on file  . Years of education: Not on file  . Highest education level: Not on file  Occupational History  . Not on file  Tobacco Use  . Smoking status: Never Smoker  . Smokeless tobacco: Never Used  Substance and Sexual Activity  . Alcohol use: No  . Drug use: No  . Sexual activity: Yes    Birth control/protection: None  Other Topics Concern  . Not on file  Social History Narrative  . Not on file   Social Determinants of Health   Financial Resource Strain:   . Difficulty of Paying Living Expenses: Not on file  Food Insecurity:   . Worried About Charity fundraiser in the Last Year: Not on file  . Ran Out of Food in the Last Year: Not on file  Transportation Needs:   . Lack of Transportation (Medical): Not on file  . Lack of Transportation (Non-Medical): Not on file  Physical Activity:   . Days of Exercise per Week: Not on file  . Minutes of Exercise per Session: Not on file  Stress:   . Feeling of Stress : Not on file  Social Connections:   . Frequency of Communication with Friends and Family: Not  on file  . Frequency of Social Gatherings with Friends and Family: Not on file  . Attends Religious Services: Not on file  . Active Member of Clubs or Organizations: Not on file  . Attends Archivist Meetings: Not on file  . Marital Status: Not on file  Intimate Partner Violence:   . Fear of Current or Ex-Partner: Not on file  . Emotionally Abused: Not on file  . Physically Abused: Not on file  . Sexually Abused: Not on file    Family History  Problem Relation Age of Onset  . Arthritis Mother   . Depression Mother   . Diabetes Mother   . Hyperlipidemia Mother   . Arthritis Father   . Cancer Maternal Grandmother        breast  . Diabetes Paternal Grandmother   . Diabetes Paternal Grandfather   . Hearing loss Paternal Grandfather   . Cancer Maternal Aunt 50       Breast Cancer     Review of Systems  Constitutional: Negative.  Negative for chills and fever.  HENT: Negative.  Negative for congestion and sore throat.   Respiratory: Negative.  Negative for cough and shortness of breath.   Cardiovascular: Negative.  Negative for chest pain and palpitations.  Gastrointestinal: Negative.  Negative for abdominal pain, diarrhea, nausea and vomiting.  Genitourinary: Negative.  Negative for dysuria and hematuria.  Skin: Negative.  Negative for rash.  Neurological: Negative.  Negative for dizziness and headaches.  All other systems reviewed and are negative.   Today's Vitals   02/04/20 0814  BP: 119/75  Pulse: 87  Resp: 16  Temp: 98.5 F (36.9 C)  TempSrc: Temporal  SpO2: 98%  Weight: 251 lb (113.9 kg)  Height: 5\' 7"  (1.702 m)   Body mass index is 39.31 kg/m. Wt Readings from Last 3 Encounters:  02/04/20 251 lb (113.9 kg)  08/26/19 253 lb 12.8 oz (115.1 kg)  05/27/19 271 lb (122.9 kg)    Physical Exam Constitutional:      Appearance: Normal appearance.  HENT:     Head: Normocephalic.  Eyes:     Extraocular Movements: Extraocular movements intact.      Conjunctiva/sclera: Conjunctivae normal.     Pupils: Pupils are equal, round, and reactive to light.  Cardiovascular:     Rate and Rhythm: Normal rate and regular rhythm.     Pulses: Normal pulses.     Heart sounds: Normal heart sounds.  Pulmonary:     Effort: Pulmonary effort is normal.     Breath sounds: Normal breath sounds.  Musculoskeletal:        General: Normal range of motion.  Skin:    General: Skin is warm  and dry.     Capillary Refill: Capillary refill takes less than 2 seconds.  Neurological:     General: No focal deficit present.     Mental Status: She is alert and oriented to person, place, and time.  Psychiatric:        Mood and Affect: Mood normal.        Behavior: Behavior normal.    Results for orders placed or performed in visit on 02/04/20 (from the past 24 hour(s))  POCT glucose (manual entry)     Status: Normal   Collection Time: 02/04/20  8:24 AM  Result Value Ref Range   POC Glucose 94 70 - 99 mg/dl  POCT glycosylated hemoglobin (Hb A1C)     Status: Abnormal   Collection Time: 02/04/20  8:28 AM  Result Value Ref Range   Hemoglobin A1C 6.2 (A) 4.0 - 5.6 %   HbA1c POC (<> result, manual entry)     HbA1c, POC (prediabetic range)     HbA1c, POC (controlled diabetic range)       ASSESSMENT & PLAN: Type 2 diabetes mellitus with hyperglycemia, without long-term current use of insulin (HCC) Well-controlled diabetes with hemoglobin A1c at 6.2.  Continue Metformin, Ozempic, and [place].  Diet and nutrition discussed. Follow-up in 3 months.  Calayah was seen today for diabetes and medication refill.  Diagnoses and all orders for this visit:  Type 2 diabetes mellitus with hyperglycemia, without long-term current use of insulin (HCC) -     Microalbumin, urine -     POCT glucose (manual entry) -     POCT glycosylated hemoglobin (Hb A1C) -     HM Diabetes Foot Exam -     dapagliflozin propanediol (FARXIGA) 5 MG TABS tablet; Take 1 tablet (5 mg total) by mouth  daily. -     metFORMIN (GLUCOPHAGE) 500 MG tablet; Take 2 tablets (1,000 mg total) by mouth 2 (two) times daily with a meal.  Dyslipidemia associated with type 2 diabetes mellitus (HCC) -     ezetimibe (ZETIA) 10 MG tablet; Take 1 tablet (10 mg total) by mouth daily.  PCOS (polycystic ovarian syndrome)  Body mass index (BMI) of 39.0-39.9 in adult  Statin intolerance    Patient Instructions       If you have lab work done today you will be contacted with your lab results within the next 2 weeks.  If you have not heard from [place] then please contact us. The fastest way to get your results is to register for My Chart.   IF you received an x-ray today, you will receive an invoice from [facility] Radiology. Please contact [facility] Radiology at [phone] with questions or concerns regarding your invoice.   IF you received labwork today, you will receive an invoice from [facility]. Please contact [facility] at [phone] with questions or concerns regarding your invoice.   Our billing staff will not be able to assist you with questions regarding bills from these companies.  You will be contacted with the lab results as soon as they are available. The fastest way to get your results is to activate your My Chart account. Instructions are located on the last page of this paperwork. If you have not heard from [place] regarding the results in 2 weeks, please contact this office.     Diabetes Mellitus and Nutrition, Adult When you have diabetes (diabetes mellitus), it is very important to have healthy eating habits because your blood sugar (glucose) levels are greatly affected by  what you eat and drink. Eating healthy foods in the appropriate amounts, at about the same times every day, can help you:  Control your blood glucose.  Lower your risk of heart disease.  Improve your blood pressure.  Reach or maintain a healthy weight. Every person with diabetes is different, and each person has  different needs for a meal plan. Your health care provider may recommend that you work with a diet and nutrition specialist (dietitian) to make a meal plan that is best for you. Your meal plan may vary depending on factors such as:  The calories you need.  The medicines you take.  Your weight.  Your blood glucose, blood pressure, and cholesterol levels.  Your activity level.  Other health conditions you have, such as heart or kidney disease. How do carbohydrates affect me? Carbohydrates, also called carbs, affect your blood glucose level more than any other type of food. Eating carbs naturally raises the amount of glucose in your blood. Carb counting is a method for keeping track of how many carbs you eat. Counting carbs is important to keep your blood glucose at a healthy level, especially if you use insulin or take certain oral diabetes medicines. It is important to know how many carbs you can safely have in each meal. This is different for every person. Your dietitian can help you calculate how many carbs you should have at each meal and for each snack. Foods that contain carbs include:  Bread, cereal, rice, pasta, and crackers.  Potatoes and corn.  Peas, beans, and lentils.  Milk and yogurt.  Fruit and juice.  Desserts, such as cakes, cookies, ice cream, and candy. How does alcohol affect me? Alcohol can cause a sudden decrease in blood glucose (hypoglycemia), especially if you use insulin or take certain oral diabetes medicines. Hypoglycemia can be a life-threatening condition. Symptoms of hypoglycemia (sleepiness, dizziness, and confusion) are similar to symptoms of having too much alcohol. If your health care provider says that alcohol is safe for you, follow these guidelines:  Limit alcohol intake to no more than 1 drink per day for nonpregnant women and 2 drinks per day for men. One drink equals 12 oz of beer, 5 oz of wine, or 1 oz of hard liquor.  Do not drink on an  empty stomach.  Keep yourself hydrated with water, diet soda, or unsweetened iced tea.  Keep in mind that regular soda, juice, and other mixers may contain a lot of sugar and must be counted as carbs. What are tips for following this plan?  Reading food labels  Start by checking the serving size on the "Nutrition Facts" label of packaged foods and drinks. The amount of calories, carbs, fats, and other nutrients listed on the label is based on one serving of the item. Many items contain more than one serving per package.  Check the total grams (g) of carbs in one serving. You can calculate the number of servings of carbs in one serving by dividing the total carbs by 15. For example, if a food has 30 g of total carbs, it would be equal to 2 servings of carbs.  Check the number of grams (g) of saturated and trans fats in one serving. Choose foods that have low or no amount of these fats.  Check the number of milligrams (mg) of salt (sodium) in one serving. Most people should limit total sodium intake to less than 2,300 mg per day.  Always check the nutrition information of  foods labeled as "low-fat" or "nonfat". These foods may be higher in added sugar or refined carbs and should be avoided.  Talk to your dietitian to identify your daily goals for nutrients listed on the label. Shopping  Avoid buying canned, premade, or processed foods. These foods tend to be high in fat, sodium, and added sugar.  Shop around the outside edge of the grocery store. This includes fresh fruits and vegetables, bulk grains, fresh meats, and fresh dairy. Cooking  Use low-heat cooking methods, such as baking, instead of high-heat cooking methods like deep frying.  Cook using healthy oils, such as olive, canola, or sunflower oil.  Avoid cooking with butter, cream, or high-fat meats. Meal planning  Eat meals and snacks regularly, preferably at the same times every day. Avoid going long periods of time without  eating.  Eat foods high in fiber, such as fresh fruits, vegetables, beans, and whole grains. Talk to your dietitian about how many servings of carbs you can eat at each meal.  Eat 4-6 ounces (oz) of lean protein each day, such as lean meat, chicken, fish, eggs, or tofu. One oz of lean protein is equal to: ? 1 oz of meat, chicken, or fish. ? 1 egg. ?  cup of tofu.  Eat some foods each day that contain healthy fats, such as avocado, nuts, seeds, and fish. Lifestyle  Check your blood glucose regularly.  Exercise regularly as told by your health care provider. This may include: ? 150 minutes of moderate-intensity or vigorous-intensity exercise each week. This could be brisk walking, biking, or water aerobics. ? Stretching and doing strength exercises, such as yoga or weightlifting, at least 2 times a week.  Take medicines as told by your health care provider.  Do not use any products that contain nicotine or tobacco, such as cigarettes and e-cigarettes. If you need help quitting, ask your health care provider.  Work with a Social worker or diabetes educator to identify strategies to manage stress and any emotional and social challenges. Questions to ask a health care provider  Do I need to meet with a diabetes educator?  Do I need to meet with a dietitian?  What number can I call if I have questions?  When are the best times to check my blood glucose? Where to find more information:  American Diabetes Association: diabetes.org  Academy of Nutrition and Dietetics: www.eatright.[employer] of Diabetes and Digestive and Kidney Diseases (NIH): [employer] Summary  A healthy meal plan will help you control your blood glucose and maintain a healthy lifestyle.  Working with a diet and nutrition specialist (dietitian) can help you make a meal plan that is best for you.  Keep in mind that carbohydrates (carbs) and alcohol have immediate effects on your blood glucose  levels. It is important to count carbs and to use alcohol carefully. This information is not intended to replace advice given to you by your health care provider. Make sure you discuss any questions you have with your health care provider. Document Revised: 02/15/2017 Document Reviewed: 04/09/2016 Elsevier Patient Education  2020 Elsevier Inc.      Agustina Caroli, MD [facility]

## 2020-02-05 LAB — MICROALBUMIN, URINE: Microalbumin, Urine: 27.4 ug/mL

## 2020-03-09 ENCOUNTER — Other Ambulatory Visit: Payer: Self-pay | Admitting: Emergency Medicine

## 2020-03-09 DIAGNOSIS — E1165 Type 2 diabetes mellitus with hyperglycemia: Secondary | ICD-10-CM

## 2020-03-09 MED FILL — OZEMPIC 0.25 OR 0.5 MG/DOSE: 2 | 28 days supply | Qty: 2 | Fill #0

## 2020-03-09 MED FILL — FARXIGA 5 MG TABLET: 5 | 30 days supply | Qty: 30 | Fill #1

## 2020-03-09 NOTE — Telephone Encounter (Signed)
Requested medication (s) are due for refill today- unsure  Requested medication (s) are on the active medication list -no  Future visit scheduled -yes  Last refill:11/02/19  Notes to clinic: Ozempic is mentioned as medication patient is taking- but is not current on list- sent for review of request  Requested Prescriptions  Pending Prescriptions Disp Refills   OZEMPIC, 0.25 OR 0.5 MG/DOSE, 2 MG/1.5ML SOPN [Pharmacy Med Name: OZEMPIC 0.25 OR 0.5 MG/DOSE 2 Solution Pen-injector] 4.5 mL 2    Sig: INJECT 0.5MG  TOTAL INTO THE SKIN ONCE A WEEK.      Endocrinology:  Diabetes - GLP-1 Receptor Agonists Passed - 03/09/2020  8:28 AM      Passed - HBA1C is between 0 and 7.9 and within 180 days    Hemoglobin A1C  Date Value Ref Range Status  02/04/2020 6.2 (A) 4.0 - 5.6 % Final   Hgb A1c MFr Bld  Date Value Ref Range Status  07/09/2014 6.3 (H) <5.7 % Final    Comment:                                                                           According to the ADA Clinical Practice Recommendations for 2011, when HbA1c is used as a screening test:     >=6.5%   Diagnostic of Diabetes Mellitus            (if abnormal result is confirmed)   5.7-6.4%   Increased risk of developing Diabetes Mellitus   References:Diagnosis and Classification of Diabetes Mellitus,Diabetes EAVW,0981,19(JYNWG 1):S62-S69 and Standards of Medical Care in         Diabetes - 2011,Diabetes NFAO,1308,65 (Suppl 1):S11-S61.             Passed - Valid encounter within last 6 months    Recent Outpatient Visits           1 month ago Type 2 diabetes mellitus with hyperglycemia, without long-term current use of insulin [facility])   Primary Care at [facility], [place], Maria Maddox   6 months ago Type 2 diabetes mellitus with hyperglycemia, without long-term current use of insulin [facility])   [facility] Care at [facility], [place], Maria Maddox   9 months ago Dyslipidemia associated with type 2 diabetes mellitus [facility])   Primary Care at  [facility], [place], Maria Maddox   1 year ago Dyslipidemia associated with type 2 diabetes mellitus [facility])   Primary Care at [facility], [place], Maria Maddox   1 year ago Type 2 diabetes mellitus with hyperglycemia, without long-term current use of insulin [facility])   Primary Care at [facility], Maria Bloomer, Maria Maddox       Future Appointments             In 2 months [place], Maria Bloomer, Maria Maddox Primary Care at [place], [facility]                 Requested Prescriptions  Pending Prescriptions Disp Refills   OZEMPIC, 0.25 OR 0.5 MG/DOSE, 2 MG/1.5ML SOPN [Pharmacy Med Name: OZEMPIC 0.25 OR 0.5 MG/DOSE 2 Solution Pen-injector] 4.5 mL 2    Sig: INJECT 0.5MG  TOTAL INTO THE SKIN ONCE A WEEK.      Endocrinology:  Diabetes - GLP-1  Receptor Agonists Passed - 03/09/2020  8:28 AM      Passed - HBA1C is between 0 and 7.9 and within 180 days    Hemoglobin A1C  Date Value Ref Range Status  02/04/2020 6.2 (A) 4.0 - 5.6 % Final   Hgb A1c MFr Bld  Date Value Ref Range Status  07/09/2014 6.3 (H) <5.7 % Final    Comment:                                                                           According to the ADA Clinical Practice Recommendations for 2011, when HbA1c is used as a screening test:     >=6.5%   Diagnostic of Diabetes Mellitus            (if abnormal result is confirmed)   5.7-6.4%   Increased risk of developing Diabetes Mellitus   References:Diagnosis and Classification of Diabetes Mellitus,Diabetes [id number] 1):S62-S69 and Standards of Medical Care in         Diabetes - 2011,Diabetes [id number] (Suppl 1):S11-S61.             Passed - Valid encounter within last 6 months    Recent Outpatient Visits           1 month ago Type 2 diabetes mellitus with hyperglycemia, without long-term current use of insulin [facility])   Primary Care at [facility], Maria Mar[place], Maria Maddox   6 months ago Type 2 diabetes mellitus with hyperglycemia, without long-term current use of insulin [facility])    Primary Care at [facility], [place], Maria Maddox   9 months ago Dyslipidemia associated with type 2 diabetes mellitus [facility])   Primary Care at [facility], [place], Maria Maddox   1 year ago Dyslipidemia associated with type 2 diabetes mellitus [facility])   Primary Care at [facility], [place], Maria Maddox   1 year ago Type 2 diabetes mellitus with hyperglycemia, without long-term current use of insulin [facility])   Primary Care at [facility], Maria Bloomer, Maria Maddox       Future Appointments             In 2 months Sagardia, Maria Bloomer, Maria Maddox Primary Care at [place], [facility]

## 2020-04-14 ENCOUNTER — Encounter (HOSPITAL_COMMUNITY): Payer: Self-pay | Admitting: Emergency Medicine

## 2020-04-14 ENCOUNTER — Emergency Department (HOSPITAL_COMMUNITY)
Admission: EM | Admit: 2020-04-14 | Discharge: 2020-04-14 | Disposition: A | Discharge status: Home / Self Care | Payer: 59 | Attending: Emergency Medicine | Admitting: Emergency Medicine

## 2020-04-14 ENCOUNTER — Other Ambulatory Visit: Payer: Self-pay

## 2020-04-14 ENCOUNTER — Emergency Department (HOSPITAL_COMMUNITY): Payer: 59

## 2020-04-14 DIAGNOSIS — E1165 Type 2 diabetes mellitus with hyperglycemia: Secondary | ICD-10-CM | POA: Insufficient documentation

## 2020-04-14 DIAGNOSIS — U071 COVID-19: Secondary | ICD-10-CM | POA: Insufficient documentation

## 2020-04-14 DIAGNOSIS — R059 Cough, unspecified: Secondary | ICD-10-CM | POA: Diagnosis not present

## 2020-04-14 DIAGNOSIS — R0602 Shortness of breath: Secondary | ICD-10-CM | POA: Diagnosis not present

## 2020-04-14 DIAGNOSIS — Z7984 Long term (current) use of oral hypoglycemic drugs: Secondary | ICD-10-CM | POA: Insufficient documentation

## 2020-04-14 LAB — SARS CORONAVIRUS 2 BY RT PCR (HOSPITAL ORDER, PERFORMED IN ~~LOC~~ HOSPITAL LAB): SARS Coronavirus 2: POSITIVE — AB

## 2020-04-14 LAB — GROUP A STREP BY PCR: Group A Strep by PCR: NOT DETECTED

## 2020-04-14 NOTE — Discharge Instructions (Signed)
Your history and physical exam is suggestive of a viral illness.  You have been tested for COVID-19.    Please maintain isolation precautions.  Check your temperature regularly and take Tylenol as needed for fever control.  Increase your oral hydration and continue to eat regular meals to avoid electrolyte derangement and further fatigue.  I recommend over-the-counter medications as needed for symptom relief.  You may wish to consider obtaining a pulse oximeter over-the-counter at a pharmacy.  If you are below 90% oxygen saturation, you will need to return to the ED.   I recommend warm tea with honey, Chloraseptic spray, throat lozenges, and salt water gargles for your sore throat symptoms.  NSAIDs will also help with sore throat and fever control.  Follow-up with your primary care provider regarding today's encounter and for ongoing management.  If you do not have one, please get established with one as soon as possible.  If you do not have insurance, please consider the [facility].  Return to the ED or seek immediate medical attention should you experience any new or worsening symptoms.    If you have additional questions, contact your local health department or call the epidemiologist on call at [phone] (available 24/7). ? This guidance is subject to change. For the most up-to-date guidance from [facility], please refer to their website: YouBlogs.pl

## 2020-04-14 NOTE — ED Triage Notes (Signed)
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

## 2020-04-14 NOTE — ED Provider Notes (Signed)
[facility] EMERGENCY DEPARTMENT Provider Note   CSN: [id number] Arrival date & time: 04/14/20  1221     History No chief complaint on file.   Maria Maddox is a 35 y.o. female with no significant past medical history presents the ED with cold symptoms.  On my examination, patient reports that she developed sore throat, congestion, and shortness of breath symptoms yesterday.  She was concerned for possible strep pharyngitis.  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  She is fully immunized.  She lives with her husband and son who have been asymptomatic.  She denies any fevers, chills, hemoptysis, cough, abdominal discomfort, nausea or vomiting, inability to eat or drink, diminished appetite, loss of smell/taste, numbness or weakness, headache, history of clots requiring disorder, unilateral extremity swelling or edema, leg symptoms, or any other symptoms.  She simply wanted to obtain testing to ensure this was not strep pharyngitis versus COVID-19.  HPI     Past Medical History:  Diagnosis Date  . Eczema   . Glaucoma    has had for 10 yrears  . Legally blind in right eye, as defined in [place]   . PCOS (polycystic ovarian syndrome)     Patient Active Problem List   Diagnosis Date Noted  . Statin intolerance 02/04/2020  . Body mass index (BMI) of 39.0-39.9 in adult 02/04/2020  . Type 2 diabetes mellitus with hyperglycemia, without long-term current use of insulin ([place]) 10/14/2016  . Morbid obesity ([place]) 01/15/2013  . PCOS (polycystic ovarian syndrome) 01/15/2013    Past Surgical History:  Procedure Laterality Date  . EYE SURGERY     right side, numerous surgeries (now blind)  . PILONIDAL CYST EXCISION N/A 10/26/2015   Procedure: CYST EXCISION PILONIDAL;  Surgeon: Coralie Keens, MD;  Location: [facility];  Service: General;  Laterality: N/A;     OB History   No obstetric history on file.     Family History  Problem Relation Age of Onset   . Arthritis Mother   . Depression Mother   . Diabetes Mother   . Hyperlipidemia Mother   . Arthritis Father   . Cancer Maternal Grandmother        breast  . Diabetes Paternal Grandmother   . Diabetes Paternal Grandfather   . Hearing loss Paternal Grandfather   . Cancer Maternal Aunt 38       Breast Cancer    Social History   Tobacco Use  . Smoking status: Never Smoker  . Smokeless tobacco: Never Used  Substance Use Topics  . Alcohol use: No  . Drug use: No    Home Medications Prior to Admission medications   Medication Sig Start Date End Date Taking? Authorizing Provider  AFLURIA QUADRIVALENT 0.5 ML injection  01/19/19   [provider]  atropine 1 % ophthalmic solution as needed.    [provider]  brimonidine (ALPHAGAN P) 0.1 % SOLN Place 1 drop into both eyes 2 (two) times daily.    [provider]  dapagliflozin propanediol (FARXIGA) 5 MG TABS tablet Take 1 tablet (5 mg total) by mouth daily. 02/04/20   Horald Pollen, MD  dorzolamide-timolol (COSOPT) 22.3-6.8 MG/ML ophthalmic solution Place 1 drop into the right eye daily.    [provider]  ezetimibe (ZETIA) 10 MG tablet Take 1 tablet (10 mg total) by mouth daily. 02/04/20   Horald Pollen, MD  metFORMIN (GLUCOPHAGE) 500 MG tablet TAKE 1 TABLET(500 MG) BY MOUTH TWICE  DAILY WITH A MEAL Patient not taking: Reported on 02/04/2020 09/03/19   Horald Pollen, MD  metFORMIN (GLUCOPHAGE) 500 MG tablet Take 2 tablets (1,000 mg total) by mouth 2 (two) times daily with a meal. 02/04/20   Sagardia, Ines Bloomer, MD  OZEMPIC, 0.25 OR 0.5 MG/DOSE, 2 MG/1.5ML SOPN INJECT 0.5MG  TOTAL INTO THE SKIN ONCE A WEEK. 03/09/20   Horald Pollen, MD  timolol (TIMOPTIC) 0.25 % ophthalmic solution  04/23/19   [provider]    Allergies    Crestor [rosuvastatin calcium]  Review of Systems   Review of Systems  All other systems reviewed and are negative.   Physical  Exam Updated Vital Signs BP (!) 147/71   Pulse 98   Temp 98.3 F (36.8 C) (Oral)   Resp 18   Ht 5\' 7"  (1.702 m)   Wt 113.4 kg   SpO2 99%   BMI 39.16 kg/m   Physical Exam Vitals and nursing note reviewed. Exam conducted with a chaperone present.  Constitutional:      General: She is not in acute distress.    Appearance: She is not toxic-appearing.  HENT:     Head: Normocephalic and atraumatic.     Nose: Congestion present.     Mouth/Throat:     Pharynx: Oropharynx is clear. Posterior oropharyngeal erythema present.     Comments: Patent oropharynx.  No trismus.  No floor of mouth swelling.  No asymmetries.  Tolerating secretions well.  Mildly erythematous.  No exudates. Eyes:     General: No scleral icterus.    Conjunctiva/sclera: Conjunctivae normal.  Cardiovascular:     Rate and Rhythm: Normal rate.     Pulses: Normal pulses.  Pulmonary:     Effort: Pulmonary effort is normal. No respiratory distress.     Breath sounds: No wheezing or rales.     Comments: CTA bilaterally.  No increased work of breathing.  No tachypnea.  99% on room air. Musculoskeletal:     Right lower leg: No edema.     Left lower leg: No edema.  Skin:    General: Skin is dry.     Capillary Refill: Capillary refill takes less than 2 seconds.  Neurological:     Mental Status: She is alert.     GCS: GCS eye subscore is 4. GCS verbal subscore is 5. GCS motor subscore is 6.  Psychiatric:        Mood and Affect: Mood normal.        Behavior: Behavior normal.        Thought Content: Thought content normal.     ED Results / Procedures / Treatments   Labs (all labs ordered are listed, but only abnormal results are displayed) Labs Reviewed  SARS CORONAVIRUS 2 BY RT PCR (HOSPITAL ORDER, [facility]) - Abnormal; Notable for the following components:      Result Value   SARS Coronavirus 2 POSITIVE (*)    All other components within normal limits  GROUP A STREP BY PCR     EKG None  Radiology DG Chest Portable 1 View  Result Date: 04/14/2020 CLINICAL DATA:  Cough and shortness of breath EXAM: PORTABLE CHEST 1 VIEW COMPARISON:  July 11, 2016 FINDINGS: Lungs are clear. Heart size and pulmonary vascularity are normal. No adenopathy. No bone lesions. IMPRESSION: Lungs clear.  Cardiac silhouette normal. Electronically Signed   By: Lowella Grip III M.D.   On: 04/14/2020 13:18    Procedures Procedures  Medications Ordered in ED Medications - No data to display  ED Course  I have reviewed the triage vital signs and the nursing notes.  Pertinent labs & imaging results that were available during my care of the patient were reviewed by me and considered in my medical decision making (see chart for details).  Clinical Course as of 04/14/20 1445  Thu Apr 14, 2020  1443 SARS Coronavirus 2(!): POSITIVE [GG]    Clinical Course User Index [GG] Corena Herter, PA-C   MDM Rules/Calculators/A&P                          Maria Maddox was evaluated in Emergency Department on 04/14/2020 for the symptoms described in the history of present illness. She was evaluated in the context of the global COVID-19 pandemic, which necessitated consideration that the patient might be at risk for infection with the SARS-CoV-2 virus that causes COVID-19. Institutional protocols and algorithms that pertain to the evaluation of patients at risk for COVID-19 are in a state of rapid change based on information released by regulatory bodies including the CDC and federal and state organizations. These policies and algorithms were followed during the patient's care in the ED.  I personally reviewed patient's medical chart and all notes from triage and staff during today's encounter. I have also ordered and reviewed all labs and imaging that I felt to be medically necessary in the evaluation of this patient's complaints and with consideration of their with their physical exam. If  needed, translation services were available and utilized.   Patient with symptoms consistent with viral illness for 1 day.  COVID-19 test is obtained and POSITIVE.  Chest x-ray obtained while patient was in triage in no acute cardiopulmonary disease.  Their symptoms are likely of viral etiology and we discussed that antibiotics are not indicated for viral infections.  Patient will be discharged with symptomatic treatment.  Patient is tolerating food and liquid without difficulty and I do not believe that laboratory work-up would yield any significant findings.  Low suspicion for electrolyte derangement, but emphasized the importance of eating regularly.  I also emphasized the importance of rest, continued oral hydration, and antipyretics as needed for fever control.    Patient able to speak in complete sentences without difficulty swallowing or trouble handling secretions.  No stridor, wheezing, tripoding, grey pseudomembrane, trismus, uvular deviation, sublingual/submandibular/submental swelling or induration, nuchal rigidity, or neck pain.  Low suspicion for deep tissue infection such as PTA or RPA, epiglottitis, vertebral dissection, meningitis, or other emergent pathology.  Discussed conservative therapy such as warm tea with honey, Chloraseptic spray, throat lozenges, and salt-water gargles.  Encouraged NSAIDs and emphasized importance of oral hydration and antipyretics as needed for fever control.    Patient is not demonstrating any increased work of breathing or exertional hypoxia.  Given that they are low risk, they are not the ideal candidate for outpatient infusion therapy, particularly given low supplies.  They were provided opportunity to ask any additional questions and have none at this time.  Prior to discharge patient is feeling well, agreeable with plan for discharge home.  They have expressed understanding of verbal discharge instructions as well as return precautions and are agreeable to  the plan.    Final Clinical Impression(s) / ED Diagnoses Final diagnoses:  [id number]    Rx / DC Orders ED Discharge Orders    None       Corena Herter, PA-C  04/14/20 [place] LakeAlvin Critchley, DO 04/14/20 1926

## 2020-04-19 MED FILL — FARXIGA 5 MG TABLET: 5 | 30 days supply | Qty: 30 | Fill #2

## 2020-04-19 MED FILL — METFORMIN HCL 500 MG TABS: 500 | 30 days supply | Qty: 120 | Fill #1

## 2020-04-19 MED FILL — EZETIMIBE 10 MG TABS: 10 | 30 days supply | Qty: 30 | Fill #1

## 2020-04-27 ENCOUNTER — Telehealth: Payer: Self-pay | Admitting: *Deleted

## 2020-04-27 ENCOUNTER — Other Ambulatory Visit (HOSPITAL_COMMUNITY): Payer: Self-pay | Admitting: Emergency Medicine

## 2020-04-27 MED FILL — TRULICITY 0.75 MG/0.5 ML PE: 0.75 | 28 days supply | Qty: 2 | Fill #0

## 2020-04-27 NOTE — Telephone Encounter (Signed)
Faxed Rx for Trulicity 4.58 mg to [facility] pharmacy, because co pay for Ozempic was $181.30. Patient requested another Rx. Confirmation at 11:33 am.

## 2020-05-12 ENCOUNTER — Ambulatory Visit: Payer: 59 | Admitting: Emergency Medicine

## 2020-05-27 MED FILL — FARXIGA 5 MG TABLET: 5 | 30 days supply | Qty: 30 | Fill #3

## 2020-05-27 MED FILL — METFORMIN HCL 500 MG TABS: 500 | 30 days supply | Qty: 120 | Fill #2

## 2020-05-27 MED FILL — EZETIMIBE 10 MG TABS: 10 | 30 days supply | Qty: 30 | Fill #2

## 2020-06-09 ENCOUNTER — Telehealth: Payer: Self-pay | Admitting: Emergency Medicine

## 2020-06-09 NOTE — Telephone Encounter (Addendum)
06/09/2020 - I LEFT PATIENT A VOICE MAIL INFORMING HER THAT DR. SAGARDIA'S LAST DAY IN THE OFFICE IS Thursday (06/09/2020). I TOLD HER WE HAVE TO CANCEL THE 3 MONTH APPOINTMENT SHE HAD WITH HIM ON Wednesday 06/15/2020. I GAVE HER HIS NEW OFFICE NUMBER AND TOLD HER TO CALL us BACK IF SHE HAS ANY QUESTIONS. [place]

## 2020-06-15 ENCOUNTER — Ambulatory Visit: Payer: 59 | Admitting: Emergency Medicine

## 2020-07-07 ENCOUNTER — Other Ambulatory Visit: Payer: Self-pay

## 2020-07-07 ENCOUNTER — Ambulatory Visit (INDEPENDENT_AMBULATORY_CARE_PROVIDER_SITE_OTHER): Payer: Managed Care, Other (non HMO) | Admitting: Emergency Medicine

## 2020-07-07 ENCOUNTER — Encounter: Payer: Self-pay | Admitting: Emergency Medicine

## 2020-07-07 ENCOUNTER — Other Ambulatory Visit (HOSPITAL_COMMUNITY): Payer: Self-pay

## 2020-07-07 VITALS — BP 118/70 | HR 84 | Temp 98.0°F | Ht 66.0 in | Wt 267.8 lb

## 2020-07-07 DIAGNOSIS — E282 Polycystic ovarian syndrome: Secondary | ICD-10-CM

## 2020-07-07 DIAGNOSIS — E785 Hyperlipidemia, unspecified: Secondary | ICD-10-CM | POA: Diagnosis not present

## 2020-07-07 DIAGNOSIS — E1169 Type 2 diabetes mellitus with other specified complication: Secondary | ICD-10-CM

## 2020-07-07 DIAGNOSIS — E1165 Type 2 diabetes mellitus with hyperglycemia: Secondary | ICD-10-CM | POA: Diagnosis not present

## 2020-07-07 DIAGNOSIS — Z789 Other specified health status: Secondary | ICD-10-CM | POA: Diagnosis not present

## 2020-07-07 DIAGNOSIS — L309 Dermatitis, unspecified: Secondary | ICD-10-CM | POA: Diagnosis not present

## 2020-07-07 LAB — COMPREHENSIVE METABOLIC PANEL
ALT: 13 U/L (ref 0–35)
AST: 9 U/L (ref 0–37)
Albumin: 3.6 g/dL (ref 3.5–5.2)
Alkaline Phosphatase: 46 U/L (ref 39–117)
BUN: 10 mg/dL (ref 6–23)
CO2: 28 mEq/L (ref 19–32)
Calcium: 9.4 mg/dL (ref 8.4–10.5)
Chloride: 103 mEq/L (ref 96–112)
Creatinine, Ser: 0.7 mg/dL (ref 0.40–1.20)
GFR: 112.72 mL/min (ref >60.00)
Glucose, Bld: 127 mg/dL — ABNORMAL HIGH (ref 70–99)
Potassium: 4 mEq/L (ref 3.5–5.1)
Sodium: 137 mEq/L (ref 135–145)
Total Bilirubin: 0.2 mg/dL (ref 0.2–1.2)
Total Protein: 7.7 g/dL (ref 6.0–8.3)

## 2020-07-07 LAB — LIPID PANEL
Cholesterol: 184 mg/dL (ref 0–200)
HDL: 47.9 mg/dL (ref >39.00)
LDL Cholesterol: 121 mg/dL — ABNORMAL HIGH (ref 0–99)
NonHDL: 136.56
Total CHOL/HDL Ratio: 4
Triglycerides: 77 mg/dL (ref 0.0–149.0)
VLDL: 15.4 mg/dL (ref 0.0–40.0)

## 2020-07-07 LAB — POCT GLYCOSYLATED HEMOGLOBIN (HGB A1C): Hemoglobin A1C: 7 % — AB (ref 4.0–5.6)

## 2020-07-07 MED ORDER — TRULICITY 1.5 MG/0.5ML ~~LOC~~ SOAJ
1.5000 mg | SUBCUTANEOUS | 5 refills | Status: DC
  Filled 2020-07-07 – 2020-07-21 (×2): qty 6, 84d supply, fill #0
  Filled 2020-07-21 – 2020-09-06 (×3): qty 2, 28d supply, fill #0

## 2020-07-07 NOTE — Assessment & Plan Note (Signed)
Hemoglobin A1c at 7.0, higher than before.  Patient has been noncompliant with diet and nutrition.  Continue metformin and Farxiga.  Increase dose of Trulicity to 1.5 mg weekly.  Diet and nutrition discussed. Follow-up in 3 to 6 months.

## 2020-07-07 NOTE — Progress Notes (Signed)
Maria Maddox 35 y.o.   Chief Complaint  Patient presents with  . Diabetes    Follow up    HISTORY OF PRESENT ILLNESS: This is a 35 y.o. female with history of diabetes here for follow-up. Has been noncompliant with diet and nutrition and as a result has gained some weight. Presently on metformin, Farxiga, and Trulicity 5.28 mg weekly. No other complaints or medical concerns today.  HPI   Prior to Admission medications   Medication Sig Start Date End Date Taking? Authorizing Provider  AFLURIA QUADRIVALENT 0.5 ML injection  01/19/19  Yes [provider]  atropine 1 % ophthalmic solution as needed.   Yes [provider]  brimonidine (ALPHAGAN P) 0.1 % SOLN Place 1 drop into both eyes 2 (two) times daily.   Yes [provider]  dapagliflozin propanediol (FARXIGA) 5 MG TABS tablet TAKE 1 TABLET (5 MG TOTAL) BY MOUTH DAILY. 02/04/20 02/03/21 Yes Callaghan Laverdure, Ines Bloomer, MD  dorzolamide-timolol (COSOPT) 22.3-6.8 MG/ML ophthalmic solution Place 1 drop into the right eye daily.   Yes [provider]  Dulaglutide 0.75 MG/0.5ML SOPN INJECT 0.75MG  UNDER THE SKIN WEEKLY 04/27/20 04/27/21 Yes Kseniya Grunden, Ines Bloomer, MD  ezetimibe (ZETIA) 10 MG tablet TAKE 1 TABLET (10 MG TOTAL) BY MOUTH DAILY. 02/04/20 02/03/21 Yes Darcia Lampi, Ines Bloomer, MD  metFORMIN (GLUCOPHAGE) 500 MG tablet TAKE 1 TABLET(500 MG) BY MOUTH TWICE DAILY WITH A MEAL 09/03/19  Yes Ashland Osmer, Ines Bloomer, MD  timolol (TIMOPTIC) 0.25 % ophthalmic solution  04/23/19  Yes [provider]  metFORMIN (GLUCOPHAGE) 500 MG tablet TAKE 2 TABLETS (1,000 MG TOTAL) BY MOUTH 2 (TWO) TIMES DAILY WITH A MEAL. Patient not taking: Reported on 07/07/2020 02/04/20 02/03/21  Horald Pollen, MD  OZEMPIC, 0.25 OR 0.5 MG/DOSE, 2 MG/1.5ML SOPN INJECT 0.5MG  TOTAL INTO THE SKIN ONCE A WEEK. Patient not taking: Reported on 07/07/2020 03/09/20   Horald Pollen, MD    Allergies  Allergen Reactions  . Crestor  [Rosuvastatin Calcium] Other (See Comments)    Per patient body pain    Patient Active Problem List   Diagnosis Date Noted  . Statin intolerance 02/04/2020  . Body mass index (BMI) of 39.0-39.9 in adult 02/04/2020  . Type 2 diabetes mellitus with hyperglycemia, without long-term current use of insulin ([place]) 10/14/2016  . Morbid obesity ([place]) 01/15/2013  . PCOS (polycystic ovarian syndrome) 01/15/2013    Past Medical History:  Diagnosis Date  . Eczema   . Glaucoma    has had for 10 yrears  . Legally blind in right eye, as defined in [place]   . PCOS (polycystic ovarian syndrome)     Past Surgical History:  Procedure Laterality Date  . EYE SURGERY     right side, numerous surgeries (now blind)  . PILONIDAL CYST EXCISION N/A 10/26/2015   Procedure: CYST EXCISION PILONIDAL;  Surgeon: Coralie Keens, MD;  Location: [facility];  Service: General;  Laterality: N/A;    Social History   Socioeconomic History  . Marital status: Married    Spouse name: Not on file  . Number of children: Not on file  . Years of education: Not on file  . Highest education level: Not on file  Occupational History  . Not on file  Tobacco Use  . Smoking status: Never Smoker  . Smokeless tobacco: Never Used  Substance and Sexual Activity  . Alcohol use: No  . Drug use: No  . Sexual activity: Yes    Birth control/protection:  None  Other Topics Concern  . Not on file  Social History Narrative  . Not on file   Social Determinants of Health   Financial Resource Strain: Not on file  Food Insecurity: Not on file  Transportation Needs: Not on file  Physical Activity: Not on file  Stress: Not on file  Social Connections: Not on file  Intimate Partner Violence: Not on file    Family History  Problem Relation Age of Onset  . Arthritis Mother   . Depression Mother   . Diabetes Mother   . Hyperlipidemia Mother   . Arthritis Father   . Cancer Maternal Grandmother        breast  .  Diabetes Paternal Grandmother   . Diabetes Paternal Grandfather   . Hearing loss Paternal Grandfather   . Cancer Maternal Aunt 50       Breast Cancer     Review of Systems  Constitutional: Negative.  Negative for chills and fever.  HENT: Negative.  Negative for congestion and sore throat.   Respiratory: Negative.  Negative for cough and shortness of breath.   Cardiovascular: Negative.  Negative for chest pain and palpitations.  Gastrointestinal: Negative.  Negative for abdominal pain, diarrhea, nausea and vomiting.  Genitourinary: Negative.   Skin: Negative.  Negative for rash.  Neurological: Negative.  Negative for dizziness and headaches.  All other systems reviewed and are negative.  Today's Vitals   07/07/20 0858  BP: 118/70  Pulse: 84  Temp: 98 F (36.7 C)  TempSrc: Oral  SpO2: 98%  Weight: 267 lb 12.8 oz (121.5 kg)  Height: 5\' 6"  (1.676 m)   Body mass index is 43.22 kg/m. Wt Readings from Last 3 Encounters:  07/07/20 267 lb 12.8 oz (121.5 kg)  04/14/20 250 lb (113.4 kg)  02/04/20 251 lb (113.9 kg)     Physical Exam Vitals reviewed.  Constitutional:      Appearance: Normal appearance. She is obese.  HENT:     Head: Normocephalic.  Eyes:     Extraocular Movements: Extraocular movements intact.  Cardiovascular:     Rate and Rhythm: Normal rate and regular rhythm.     Pulses: Normal pulses.     Heart sounds: Normal heart sounds.  Pulmonary:     Effort: Pulmonary effort is normal.     Breath sounds: Normal breath sounds.  Musculoskeletal:        General: Normal range of motion.     Cervical back: Normal range of motion and neck supple.  Skin:    General: Skin is warm and dry.  Neurological:     General: No focal deficit present.     Mental Status: She is alert and oriented to person, place, and time.  Psychiatric:        Mood and Affect: Mood normal.        Behavior: Behavior normal.    Results for orders placed or performed in visit on 07/07/20  (from the past 24 hour(s))  POCT glycosylated hemoglobin (Hb A1C)     Status: Abnormal   Collection Time: 07/07/20  9:36 AM  Result Value Ref Range   Hemoglobin A1C 7.0 (A) 4.0 - 5.6 %   HbA1c POC (<> result, manual entry)     HbA1c, POC (prediabetic range)     HbA1c, POC (controlled diabetic range)     A total of 45 minutes was spent with the patient, greater than 50% of which was in counseling/coordination of care regarding uncontrolled diabetes and  cardiovascular risks associated with this condition, review of most recent blood work results including today's hemoglobin A1c, education on nutrition, review of all medications and changes made, review of most recent office visit notes, pre-charting, documentation, prognosis, need for follow-up in 3 months.   ASSESSMENT & PLAN: Type 2 diabetes mellitus with hyperglycemia, without long-term current use of insulin (HCC) Hemoglobin A1c at 7.0, higher than before.  Patient has been noncompliant with diet and nutrition.  Continue metformin and Farxiga.  Increase dose of Trulicity to 1.5 mg weekly.  Diet and nutrition discussed. Follow-up in 3 to 6 months.  [place] was seen today for diabetes.  Diagnoses and all orders for this visit:  Type 2 diabetes mellitus with hyperglycemia, without long-term current use of insulin (H[facility]) -     POCT glycosylated hemoglobin (Hb A1C) -     Dulaglutide (TRULICITY) 1.5 DJ/4.9FW SOPN; Inject 1.5 mg into the skin once a week. -     Comprehensive metabolic panel  Statin intolerance  PCOS (polycystic ovarian syndrome)  Chronic eczema -     Ambulatory referral to Dermatology  Dyslipidemia associated with type 2 diabetes mellitus ([place]) -     Lipid panel  Morbid obesity ([place])    Patient Instructions   Diabetes Mellitus and Nutrition, Adult When you have diabetes, or diabetes mellitus, it is very important to have healthy eating habits because your blood sugar (glucose) levels are greatly affected by what  you eat and drink. Eating healthy foods in the right amounts, at about the same times every day, can help you:  Control your blood glucose.  Lower your risk of heart disease.  Improve your blood pressure.  Reach or maintain a healthy weight. What can affect my meal plan? Every person with diabetes is different, and each person has different needs for a meal plan. Your health care provider may recommend that you work with a dietitian to make a meal plan that is best for you. Your meal plan may vary depending on factors such as:  The calories you need.  The medicines you take.  Your weight.  Your blood glucose, blood pressure, and cholesterol levels.  Your activity level.  Other health conditions you have, such as heart or kidney disease. How do carbohydrates affect me? Carbohydrates, also called carbs, affect your blood glucose level more than any other type of food. Eating carbs naturally raises the amount of glucose in your blood. Carb counting is a method for keeping track of how many carbs you eat. Counting carbs is important to keep your blood glucose at a healthy level, especially if you use insulin or take certain oral diabetes medicines. It is important to know how many carbs you can safely have in each meal. This is different for every person. Your dietitian can help you calculate how many carbs you should have at each meal and for each snack. How does alcohol affect me? Alcohol can cause a sudden decrease in blood glucose (hypoglycemia), especially if you use insulin or take certain oral diabetes medicines. Hypoglycemia can be a life-threatening condition. Symptoms of hypoglycemia, such as sleepiness, dizziness, and confusion, are similar to symptoms of having too much alcohol.  Do not drink alcohol if: ? Your health care provider tells you not to drink. ? You are pregnant, may be pregnant, or are planning to become pregnant.  If you drink alcohol: ? Do not drink on an  empty stomach. ? Limit how much you use to:  0-1 drink a day  for women.  0-2 drinks a day for men. ? Be aware of how much alcohol is in your drink. In the [place] one drink equals one 12 oz bottle of beer (355 mL), one 5 oz glass of wine (148 mL), or one 1 oz glass of hard liquor (44 mL). ? Keep yourself hydrated with water, diet soda, or unsweetened iced tea.  Keep in mind that regular soda, juice, and other mixers may contain a lot of sugar and must be counted as carbs. What are tips for following this plan? Reading food labels  Start by checking the serving size on the "Nutrition Facts" label of packaged foods and drinks. The amount of calories, carbs, fats, and other nutrients listed on the label is based on one serving of the item. Many items contain more than one serving per package.  Check the total grams (g) of carbs in one serving. You can calculate the number of servings of carbs in one serving by dividing the total carbs by 15. For example, if a food has 30 g of total carbs per serving, it would be equal to 2 servings of carbs.  Check the number of grams (g) of saturated fats and trans fats in one serving. Choose foods that have a low amount or none of these fats.  Check the number of milligrams (mg) of salt (sodium) in one serving. Most people should limit total sodium intake to less than 2,300 mg per day.  Always check the nutrition information of foods labeled as "low-fat" or "nonfat." These foods may be higher in added sugar or refined carbs and should be avoided.  Talk to your dietitian to identify your daily goals for nutrients listed on the label. Shopping  Avoid buying canned, pre-made, or processed foods. These foods tend to be high in fat, sodium, and added sugar.  Shop around the outside edge of the grocery store. This is where you will most often find fresh fruits and vegetables, bulk grains, fresh meats, and fresh dairy. Cooking  Use low-heat cooking methods,  such as baking, instead of high-heat cooking methods like deep frying.  Cook using healthy oils, such as olive, canola, or sunflower oil.  Avoid cooking with butter, cream, or high-fat meats. Meal planning  Eat meals and snacks regularly, preferably at the same times every day. Avoid going long periods of time without eating.  Eat foods that are high in fiber, such as fresh fruits, vegetables, beans, and whole grains. Talk with your dietitian about how many servings of carbs you can eat at each meal.  Eat 4-6 oz (112-168 g) of lean protein each day, such as lean meat, chicken, fish, eggs, or tofu. One ounce (oz) of lean protein is equal to: ? 1 oz (28 g) of meat, chicken, or fish. ? 1 egg. ?  cup (62 g) of tofu.  Eat some foods each day that contain healthy fats, such as avocado, nuts, seeds, and fish.   What foods should I eat? Fruits Berries. Apples. Oranges. Peaches. Apricots. Plums. Grapes. Mango. Papaya. Pomegranate. Kiwi. Cherries. Vegetables Lettuce. Spinach. Leafy greens, including kale, chard, collard greens, and mustard greens. Beets. Cauliflower. Cabbage. Broccoli. Carrots. Green beans. Tomatoes. Peppers. Onions. Cucumbers. Brussels sprouts. Grains Whole grains, such as whole-wheat or whole-grain bread, crackers, tortillas, cereal, and pasta. Unsweetened oatmeal. Quinoa. Brown or wild rice. Meats and other proteins Seafood. Poultry without skin. Lean cuts of poultry and beef. Tofu. Nuts. Seeds. Dairy Low-fat or fat-free dairy products such as milk, yogurt,  and cheese. The items listed above may not be a complete list of foods and beverages you can eat. Contact a dietitian for more information. What foods should I avoid? Fruits Fruits canned with syrup. Vegetables Canned vegetables. Frozen vegetables with butter or cream sauce. Grains Refined white flour and flour products such as bread, pasta, snack foods, and cereals. Avoid all processed foods. Meats and other  proteins Fatty cuts of meat. Poultry with skin. Breaded or fried meats. Processed meat. Avoid saturated fats. Dairy Full-fat yogurt, cheese, or milk. Beverages Sweetened drinks, such as soda or iced tea. The items listed above may not be a complete list of foods and beverages you should avoid. Contact a dietitian for more information. Questions to ask a health care provider  Do I need to meet with a diabetes educator?  Do I need to meet with a dietitian?  What number can I call if I have questions?  When are the best times to check my blood glucose? Where to find more information:  American Diabetes Association: diabetes.org  Academy of Nutrition and Dietetics: www.eatright.[employer] of Diabetes and Digestive and Kidney Diseases: [employer]  Association of Diabetes Care and Education Specialists: www.diabeteseducator.org Summary  It is important to have healthy eating habits because your blood sugar (glucose) levels are greatly affected by what you eat and drink.  A healthy meal plan will help you control your blood glucose and maintain a healthy lifestyle.  Your health care provider may recommend that you work with a dietitian to make a meal plan that is best for you.  Keep in mind that carbohydrates (carbs) and alcohol have immediate effects on your blood glucose levels. It is important to count carbs and to use alcohol carefully. This information is not intended to replace advice given to you by your health care provider. Make sure you discuss any questions you have with your health care provider. Document Revised: 02/10/2019 Document Reviewed: 02/10/2019 Elsevier Patient Education  2021 Luke, MD [facility]

## 2020-07-07 NOTE — Patient Instructions (Signed)
Diabetes Mellitus and Nutrition, Adult When you have diabetes, or diabetes mellitus, it is very important to have healthy eating habits because your blood sugar (glucose) levels are greatly affected by what you eat and drink. Eating healthy foods in the right amounts, at about the same times every day, can help you:  Control your blood glucose.  Lower your risk of heart disease.  Improve your blood pressure.  Reach or maintain a healthy weight. What can affect my meal plan? Every person with diabetes is different, and each person has different needs for a meal plan. Your health care provider may recommend that you work with a dietitian to make a meal plan that is best for you. Your meal plan may vary depending on factors such as:  The calories you need.  The medicines you take.  Your weight.  Your blood glucose, blood pressure, and cholesterol levels.  Your activity level.  Other health conditions you have, such as heart or kidney disease. How do carbohydrates affect me? Carbohydrates, also called carbs, affect your blood glucose level more than any other type of food. Eating carbs naturally raises the amount of glucose in your blood. Carb counting is a method for keeping track of how many carbs you eat. Counting carbs is important to keep your blood glucose at a healthy level, especially if you use insulin or take certain oral diabetes medicines. It is important to know how many carbs you can safely have in each meal. This is different for every person. Your dietitian can help you calculate how many carbs you should have at each meal and for each snack. How does alcohol affect me? Alcohol can cause a sudden decrease in blood glucose (hypoglycemia), especially if you use insulin or take certain oral diabetes medicines. Hypoglycemia can be a life-threatening condition. Symptoms of hypoglycemia, such as sleepiness, dizziness, and confusion, are similar to symptoms of having too much  alcohol.  Do not drink alcohol if: ? Your health care provider tells you not to drink. ? You are pregnant, may be pregnant, or are planning to become pregnant.  If you drink alcohol: ? Do not drink on an empty stomach. ? Limit how much you use to:  0-1 drink a day for women.  0-2 drinks a day for men. ? Be aware of how much alcohol is in your drink. In the [place] one drink equals one 12 oz bottle of beer (355 mL), one 5 oz glass of wine (148 mL), or one 1 oz glass of hard liquor (44 mL). ? Keep yourself hydrated with water, diet soda, or unsweetened iced tea.  Keep in mind that regular soda, juice, and other mixers may contain a lot of sugar and must be counted as carbs. What are tips for following this plan? Reading food labels  Start by checking the serving size on the "Nutrition Facts" label of packaged foods and drinks. The amount of calories, carbs, fats, and other nutrients listed on the label is based on one serving of the item. Many items contain more than one serving per package.  Check the total grams (g) of carbs in one serving. You can calculate the number of servings of carbs in one serving by dividing the total carbs by 15. For example, if a food has 30 g of total carbs per serving, it would be equal to 2 servings of carbs.  Check the number of grams (g) of saturated fats and trans fats in one serving. Choose foods that have   a low amount or none of these fats.  Check the number of milligrams (mg) of salt (sodium) in one serving. Most people should limit total sodium intake to less than 2,300 mg per day.  Always check the nutrition information of foods labeled as "low-fat" or "nonfat." These foods may be higher in added sugar or refined carbs and should be avoided.  Talk to your dietitian to identify your daily goals for nutrients listed on the label. Shopping  Avoid buying canned, pre-made, or processed foods. These foods tend to be high in fat, sodium, and added  sugar.  Shop around the outside edge of the grocery store. This is where you will most often find fresh fruits and vegetables, bulk grains, fresh meats, and fresh dairy. Cooking  Use low-heat cooking methods, such as baking, instead of high-heat cooking methods like deep frying.  Cook using healthy oils, such as olive, canola, or sunflower oil.  Avoid cooking with butter, cream, or high-fat meats. Meal planning  Eat meals and snacks regularly, preferably at the same times every day. Avoid going long periods of time without eating.  Eat foods that are high in fiber, such as fresh fruits, vegetables, beans, and whole grains. Talk with your dietitian about how many servings of carbs you can eat at each meal.  Eat 4-6 oz (112-168 g) of lean protein each day, such as lean meat, chicken, fish, eggs, or tofu. One ounce (oz) of lean protein is equal to: ? 1 oz (28 g) of meat, chicken, or fish. ? 1 egg. ?  cup (62 g) of tofu.  Eat some foods each day that contain healthy fats, such as avocado, nuts, seeds, and fish.   What foods should I eat? Fruits Berries. Apples. Oranges. Peaches. Apricots. Plums. Grapes. Mango. Papaya. Pomegranate. Kiwi. Cherries. Vegetables Lettuce. Spinach. Leafy greens, including kale, chard, collard greens, and mustard greens. Beets. Cauliflower. Cabbage. Broccoli. Carrots. Green beans. Tomatoes. Peppers. Onions. Cucumbers. Brussels sprouts. Grains Whole grains, such as whole-wheat or whole-grain bread, crackers, tortillas, cereal, and pasta. Unsweetened oatmeal. Quinoa. Brown or wild rice. Meats and other proteins Seafood. Poultry without skin. Lean cuts of poultry and beef. Tofu. Nuts. Seeds. Dairy Low-fat or fat-free dairy products such as milk, yogurt, and cheese. The items listed above may not be a complete list of foods and beverages you can eat. Contact a dietitian for more information. What foods should I avoid? Fruits Fruits canned with  syrup. Vegetables Canned vegetables. Frozen vegetables with butter or cream sauce. Grains Refined white flour and flour products such as bread, pasta, snack foods, and cereals. Avoid all processed foods. Meats and other proteins Fatty cuts of meat. Poultry with skin. Breaded or fried meats. Processed meat. Avoid saturated fats. Dairy Full-fat yogurt, cheese, or milk. Beverages Sweetened drinks, such as soda or iced tea. The items listed above may not be a complete list of foods and beverages you should avoid. Contact a dietitian for more information. Questions to ask a health care provider  Do I need to meet with a diabetes educator?  Do I need to meet with a dietitian?  What number can I call if I have questions?  When are the best times to check my blood glucose? Where to find more information:  American Diabetes Association: diabetes.org  Academy of Nutrition and Dietetics: www.eatright.org  National Institute of Diabetes and Digestive and Kidney Diseases: www.niddk.nih.gov  Association of Diabetes Care and Education Specialists: www.diabeteseducator.org Summary  It is important to have healthy eating   habits because your blood sugar (glucose) levels are greatly affected by what you eat and drink.  A healthy meal plan will help you control your blood glucose and maintain a healthy lifestyle.  Your health care provider may recommend that you work with a dietitian to make a meal plan that is best for you.  Keep in mind that carbohydrates (carbs) and alcohol have immediate effects on your blood glucose levels. It is important to count carbs and to use alcohol carefully. This information is not intended to replace advice given to you by your health care provider. Make sure you discuss any questions you have with your health care provider. Document Revised: 02/10/2019 Document Reviewed: 02/10/2019 Elsevier Patient Education  2021 Elsevier Inc.  

## 2020-07-21 ENCOUNTER — Other Ambulatory Visit (HOSPITAL_COMMUNITY): Payer: Self-pay

## 2020-07-21 MED FILL — Dapagliflozin Propanediol Tab 5 MG (Base Equivalent): ORAL | 90 days supply | Qty: 90 | Fill #0 | Status: AC

## 2020-07-21 MED FILL — Metformin HCl Tab 500 MG: ORAL | 30 days supply | Qty: 120 | Fill #0 | Status: AC

## 2020-07-21 MED FILL — Ezetimibe Tab 10 MG: ORAL | 90 days supply | Qty: 90 | Fill #0 | Status: AC

## 2020-07-22 ENCOUNTER — Other Ambulatory Visit (HOSPITAL_COMMUNITY): Payer: Self-pay

## 2020-08-19 ENCOUNTER — Other Ambulatory Visit (HOSPITAL_COMMUNITY): Payer: Self-pay

## 2020-08-30 ENCOUNTER — Other Ambulatory Visit (HOSPITAL_COMMUNITY): Payer: Self-pay

## 2020-09-06 ENCOUNTER — Other Ambulatory Visit (HOSPITAL_COMMUNITY): Payer: Self-pay

## 2020-09-14 ENCOUNTER — Other Ambulatory Visit (HOSPITAL_COMMUNITY): Payer: Self-pay

## 2020-09-21 ENCOUNTER — Other Ambulatory Visit (HOSPITAL_COMMUNITY): Payer: Self-pay

## 2020-09-30 ENCOUNTER — Other Ambulatory Visit (HOSPITAL_COMMUNITY): Payer: Self-pay

## 2020-10-05 ENCOUNTER — Ambulatory Visit: Payer: Managed Care, Other (non HMO) | Admitting: Emergency Medicine

## 2020-10-18 ENCOUNTER — Other Ambulatory Visit (HOSPITAL_COMMUNITY): Payer: Self-pay

## 2020-10-31 ENCOUNTER — Other Ambulatory Visit (HOSPITAL_COMMUNITY): Payer: Self-pay

## 2020-10-31 ENCOUNTER — Encounter: Payer: Self-pay | Admitting: Emergency Medicine

## 2020-10-31 ENCOUNTER — Other Ambulatory Visit: Payer: Self-pay

## 2020-10-31 ENCOUNTER — Ambulatory Visit (INDEPENDENT_AMBULATORY_CARE_PROVIDER_SITE_OTHER): Payer: Managed Care, Other (non HMO) | Admitting: Emergency Medicine

## 2020-10-31 VITALS — BP 132/82 | HR 78 | Temp 98.7°F | Ht 66.0 in | Wt 260.0 lb

## 2020-10-31 DIAGNOSIS — E785 Hyperlipidemia, unspecified: Secondary | ICD-10-CM

## 2020-10-31 DIAGNOSIS — Z789 Other specified health status: Secondary | ICD-10-CM | POA: Diagnosis not present

## 2020-10-31 DIAGNOSIS — E1165 Type 2 diabetes mellitus with hyperglycemia: Secondary | ICD-10-CM

## 2020-10-31 DIAGNOSIS — E282 Polycystic ovarian syndrome: Secondary | ICD-10-CM | POA: Diagnosis not present

## 2020-10-31 DIAGNOSIS — E1169 Type 2 diabetes mellitus with other specified complication: Secondary | ICD-10-CM

## 2020-10-31 LAB — POCT GLYCOSYLATED HEMOGLOBIN (HGB A1C): Hemoglobin A1C: 7.5 % — AB (ref 4.0–5.6)

## 2020-10-31 MED ORDER — RYBELSUS 7 MG PO TABS
1.0000 | ORAL_TABLET | Freq: Every day | ORAL | 3 refills | Status: DC
  Filled 2020-10-31: qty 90, 90d supply, fill #0
  Filled 2021-02-14: qty 90, 90d supply, fill #1

## 2020-10-31 MED FILL — Metformin HCl Tab 500 MG: ORAL | 30 days supply | Qty: 120 | Fill #1 | Status: AC

## 2020-10-31 MED FILL — Dapagliflozin Propanediol Tab 5 MG (Base Equivalent): ORAL | 90 days supply | Qty: 90 | Fill #1 | Status: AC

## 2020-10-31 MED FILL — Ezetimibe Tab 10 MG: ORAL | 90 days supply | Qty: 90 | Fill #1 | Status: AC

## 2020-10-31 NOTE — Assessment & Plan Note (Addendum)
Uncontrolled diabetes with hemoglobin A1c higher than before at 7.5.  Has lost 7 pounds since last visit.  Continue metformin 1000 mg twice a day and Farxiga 5 mg daily.  Insurance company will not cover injectables.  We will start Rybelsus 7 mg daily.  Diet and nutrition discussed. Continue Zetia 10 mg daily Follow-up in 3 months.

## 2020-10-31 NOTE — Assessment & Plan Note (Signed)
Weight loss recorded.  Diet and nutrition discussed.

## 2020-10-31 NOTE — Patient Instructions (Signed)
Diabetes Mellitus and Nutrition, Adult When you have diabetes, or diabetes mellitus, it is very important to have healthy eating habits because your blood sugar (glucose) levels are greatly affected by what you eat and drink. Eating healthy foods in the right amounts, at about the same times every day, can help you:  Control your blood glucose.  Lower your risk of heart disease.  Improve your blood pressure.  Reach or maintain a healthy weight. What can affect my meal plan? Every person with diabetes is different, and each person has different needs for a meal plan. Your health care provider may recommend that you work with a dietitian to make a meal plan that is best for you. Your meal plan may vary depending on factors such as:  The calories you need.  The medicines you take.  Your weight.  Your blood glucose, blood pressure, and cholesterol levels.  Your activity level.  Other health conditions you have, such as heart or kidney disease. How do carbohydrates affect me? Carbohydrates, also called carbs, affect your blood glucose level more than any other type of food. Eating carbs naturally raises the amount of glucose in your blood. Carb counting is a method for keeping track of how many carbs you eat. Counting carbs is important to keep your blood glucose at a healthy level, especially if you use insulin or take certain oral diabetes medicines. It is important to know how many carbs you can safely have in each meal. This is different for every person. Your dietitian can help you calculate how many carbs you should have at each meal and for each snack. How does alcohol affect me? Alcohol can cause a sudden decrease in blood glucose (hypoglycemia), especially if you use insulin or take certain oral diabetes medicines. Hypoglycemia can be a life-threatening condition. Symptoms of hypoglycemia, such as sleepiness, dizziness, and confusion, are similar to symptoms of having too much  alcohol.  Do not drink alcohol if: ? Your health care provider tells you not to drink. ? You are pregnant, may be pregnant, or are planning to become pregnant.  If you drink alcohol: ? Do not drink on an empty stomach. ? Limit how much you use to:  0-1 drink a day for women.  0-2 drinks a day for men. ? Be aware of how much alcohol is in your drink. In the [place] one drink equals one 12 oz bottle of beer (355 mL), one 5 oz glass of wine (148 mL), or one 1 oz glass of hard liquor (44 mL). ? Keep yourself hydrated with water, diet soda, or unsweetened iced tea.  Keep in mind that regular soda, juice, and other mixers may contain a lot of sugar and must be counted as carbs. What are tips for following this plan? Reading food labels  Start by checking the serving size on the "Nutrition Facts" label of packaged foods and drinks. The amount of calories, carbs, fats, and other nutrients listed on the label is based on one serving of the item. Many items contain more than one serving per package.  Check the total grams (g) of carbs in one serving. You can calculate the number of servings of carbs in one serving by dividing the total carbs by 15. For example, if a food has 30 g of total carbs per serving, it would be equal to 2 servings of carbs.  Check the number of grams (g) of saturated fats and trans fats in one serving. Choose foods that have   a low amount or none of these fats.  Check the number of milligrams (mg) of salt (sodium) in one serving. Most people should limit total sodium intake to less than 2,300 mg per day.  Always check the nutrition information of foods labeled as "low-fat" or "nonfat." These foods may be higher in added sugar or refined carbs and should be avoided.  Talk to your dietitian to identify your daily goals for nutrients listed on the label. Shopping  Avoid buying canned, pre-made, or processed foods. These foods tend to be high in fat, sodium, and added  sugar.  Shop around the outside edge of the grocery store. This is where you will most often find fresh fruits and vegetables, bulk grains, fresh meats, and fresh dairy. Cooking  Use low-heat cooking methods, such as baking, instead of high-heat cooking methods like deep frying.  Cook using healthy oils, such as olive, canola, or sunflower oil.  Avoid cooking with butter, cream, or high-fat meats. Meal planning  Eat meals and snacks regularly, preferably at the same times every day. Avoid going long periods of time without eating.  Eat foods that are high in fiber, such as fresh fruits, vegetables, beans, and whole grains. Talk with your dietitian about how many servings of carbs you can eat at each meal.  Eat 4-6 oz (112-168 g) of lean protein each day, such as lean meat, chicken, fish, eggs, or tofu. One ounce (oz) of lean protein is equal to: ? 1 oz (28 g) of meat, chicken, or fish. ? 1 egg. ?  cup (62 g) of tofu.  Eat some foods each day that contain healthy fats, such as avocado, nuts, seeds, and fish.   What foods should I eat? Fruits Berries. Apples. Oranges. Peaches. Apricots. Plums. Grapes. Mango. Papaya. Pomegranate. Kiwi. Cherries. Vegetables Lettuce. Spinach. Leafy greens, including kale, chard, collard greens, and mustard greens. Beets. Cauliflower. Cabbage. Broccoli. Carrots. Green beans. Tomatoes. Peppers. Onions. Cucumbers. Brussels sprouts. Grains Whole grains, such as whole-wheat or whole-grain bread, crackers, tortillas, cereal, and pasta. Unsweetened oatmeal. Quinoa. Brown or wild rice. Meats and other proteins Seafood. Poultry without skin. Lean cuts of poultry and beef. Tofu. Nuts. Seeds. Dairy Low-fat or fat-free dairy products such as milk, yogurt, and cheese. The items listed above may not be a complete list of foods and beverages you can eat. Contact a dietitian for more information. What foods should I avoid? Fruits Fruits canned with  syrup. Vegetables Canned vegetables. Frozen vegetables with butter or cream sauce. Grains Refined white flour and flour products such as bread, pasta, snack foods, and cereals. Avoid all processed foods. Meats and other proteins Fatty cuts of meat. Poultry with skin. Breaded or fried meats. Processed meat. Avoid saturated fats. Dairy Full-fat yogurt, cheese, or milk. Beverages Sweetened drinks, such as soda or iced tea. The items listed above may not be a complete list of foods and beverages you should avoid. Contact a dietitian for more information. Questions to ask a health care provider  Do I need to meet with a diabetes educator?  Do I need to meet with a dietitian?  What number can I call if I have questions?  When are the best times to check my blood glucose? Where to find more information:  American Diabetes Association: diabetes.org  Academy of Nutrition and Dietetics: www.eatright.org  National Institute of Diabetes and Digestive and Kidney Diseases: www.niddk.nih.gov  Association of Diabetes Care and Education Specialists: www.diabeteseducator.org Summary  It is important to have healthy eating   habits because your blood sugar (glucose) levels are greatly affected by what you eat and drink.  A healthy meal plan will help you control your blood glucose and maintain a healthy lifestyle.  Your health care provider may recommend that you work with a dietitian to make a meal plan that is best for you.  Keep in mind that carbohydrates (carbs) and alcohol have immediate effects on your blood glucose levels. It is important to count carbs and to use alcohol carefully. This information is not intended to replace advice given to you by your health care provider. Make sure you discuss any questions you have with your health care provider. Document Revised: 02/10/2019 Document Reviewed: 02/10/2019 Elsevier Patient Education  2021 Elsevier Inc.  

## 2020-10-31 NOTE — Progress Notes (Signed)
Maria Maddox 35 y.o.   Chief Complaint  Patient presents with   Diabetes    Follow up    HISTORY OF PRESENT ILLNESS: This is a 35 y.o. female with history of diabetes and dyslipidemia here for follow-up. Trulicity was not approved by insurance.  Presently on metformin and Farxiga. Intolerant to statins, presently on Zetia 10 mg daily. No complaints or medical concerns today.  Diabetes Pertinent negatives for hypoglycemia include no dizziness or headaches.    Prior to Admission medications   Medication Sig Start Date End Date Taking? Authorizing Provider  AFLURIA QUADRIVALENT 0.5 ML injection  01/19/19  Yes [provider]  atropine 1 % ophthalmic solution as needed.   Yes [provider]  brimonidine (ALPHAGAN P) 0.1 % SOLN Place 1 drop into both eyes 2 (two) times daily.   Yes [provider]  dapagliflozin propanediol (FARXIGA) 5 MG TABS tablet TAKE 1 TABLET (5 MG TOTAL) BY MOUTH DAILY. 02/04/20  Yes Rhilynn Preyer, Ines Bloomer, MD  dorzolamide-timolol (COSOPT) 22.3-6.8 MG/ML ophthalmic solution Place 1 drop into the right eye daily.   Yes [provider]  ezetimibe (ZETIA) 10 MG tablet TAKE 1 TABLET (10 MG TOTAL) BY MOUTH DAILY. 02/04/20  Yes Decklyn Hornik, Ines Bloomer, MD  metFORMIN (GLUCOPHAGE) 500 MG tablet TAKE 1 TABLET(500 MG) BY MOUTH TWICE DAILY WITH A MEAL 09/03/19  Yes Tandi Hanko, Ines Bloomer, MD  metFORMIN (GLUCOPHAGE) 500 MG tablet TAKE 2 TABLETS (1,000 MG TOTAL) BY MOUTH 2 (TWO) TIMES DAILY WITH A MEAL. 02/04/20  Yes Tabitha Riggins, Ines Bloomer, MD  timolol (TIMOPTIC) 0.25 % ophthalmic solution  04/23/19  Yes [provider]  Dulaglutide (TRULICITY) 1.5 0000000 SOPN Inject 1.5 mg into the skin once a week. Patient not taking: Reported on 10/31/2020 07/07/20   Horald Pollen, MD    Allergies  Allergen Reactions   Crestor [Rosuvastatin Calcium] Other (See Comments)    Per patient body pain    Patient Active Problem List   Diagnosis  Date Noted   Statin intolerance 02/04/2020   Body mass index (BMI) of 39.0-39.9 in adult 02/04/2020   Type 2 diabetes mellitus with hyperglycemia, without long-term current use of insulin ([place]) 10/14/2016   Morbid obesity ([place]) 01/15/2013   PCOS (polycystic ovarian syndrome) 01/15/2013    Past Medical History:  Diagnosis Date   Eczema    Glaucoma    has had for 10 yrears   Legally blind in right eye, as defined in [place]    PCOS (polycystic ovarian syndrome)     Past Surgical History:  Procedure Laterality Date   EYE SURGERY     right side, numerous surgeries (now blind)   PILONIDAL CYST EXCISION N/A 10/26/2015   Procedure: CYST EXCISION PILONIDAL;  Surgeon: Coralie Keens, MD;  Location: [facility];  Service: General;  Laterality: N/A;    Social History   Socioeconomic History   Marital status: Married    Spouse name: Not on file   Number of children: Not on file   Years of education: Not on file   Highest education level: Not on file  Occupational History   Not on file  Tobacco Use   Smoking status: Never   Smokeless tobacco: Never  Substance and Sexual Activity   Alcohol use: No   Drug use: No   Sexual activity: Yes    Birth control/protection: None  Other Topics Concern   Not on file  Social History Narrative   Not on file  Social Determinants of Health   Financial Resource Strain: Not on file  Food Insecurity: Not on file  Transportation Needs: Not on file  Physical Activity: Not on file  Stress: Not on file  Social Connections: Not on file  Intimate Partner Violence: Not on file    Family History  Problem Relation Age of Onset   Arthritis Mother    Depression Mother    Diabetes Mother    Hyperlipidemia Mother    Arthritis Father    Cancer Maternal Grandmother        breast   Diabetes Paternal Grandmother    Diabetes Paternal Grandfather    Hearing loss Paternal Grandfather    Cancer Maternal Aunt 88       Breast Cancer      Review of Systems  Constitutional: Negative.  Negative for chills and fever.  HENT: Negative.  Negative for congestion and sore throat.   Respiratory: Negative.  Negative for cough and shortness of breath.   Gastrointestinal:  Negative for abdominal pain, diarrhea, nausea and vomiting.  Genitourinary: Negative.  Negative for dysuria and hematuria.  Musculoskeletal: Negative.   Skin: Negative.  Negative for rash.  Neurological: Negative.  Negative for dizziness and headaches.  All other systems reviewed and are negative.  Today's Vitals   10/31/20 0827  BP: 132/82  Pulse: 78  Temp: 98.7 F (37.1 C)  TempSrc: Oral  SpO2: 96%  Weight: 260 lb (117.9 kg)  Height: '5\' 6"'$  (1.676 m)   Body mass index is 41.97 kg/m. Wt Readings from Last 3 Encounters:  10/31/20 260 lb (117.9 kg)  07/07/20 267 lb 12.8 oz (121.5 kg)  04/14/20 250 lb (113.4 kg)    Physical Exam Vitals reviewed.  Constitutional:      Appearance: Normal appearance. She is obese.  HENT:     Head: Normocephalic.  Cardiovascular:     Rate and Rhythm: Normal rate and regular rhythm.     Pulses: Normal pulses.     Heart sounds: Normal heart sounds.  Pulmonary:     Effort: Pulmonary effort is normal.     Breath sounds: Normal breath sounds.  Musculoskeletal:        General: Normal range of motion.     Cervical back: Normal range of motion and neck supple.  Skin:    General: Skin is warm and dry.     Capillary Refill: Capillary refill takes less than 2 seconds.  Neurological:     General: No focal deficit present.     Mental Status: She is alert and oriented to person, place, and time.  Psychiatric:        Mood and Affect: Mood normal.        Behavior: Behavior normal.    Results for orders placed or performed in visit on 10/31/20 (from the past 24 hour(s))  POCT glycosylated hemoglobin (Hb A1C)     Status: Abnormal   Collection Time: 10/31/20  8:36 AM  Result Value Ref Range   Hemoglobin A1C 7.5 (A)  4.0 - 5.6 %   HbA1c POC (<> result, manual entry)     HbA1c, POC (prediabetic range)     HbA1c, POC (controlled diabetic range)      ASSESSMENT & PLAN: Dyslipidemia associated with type 2 diabetes mellitus ([place]) Uncontrolled diabetes with hemoglobin A1c higher than before at 7.5.  Has lost 7 pounds since last visit.  Continue metformin 1000 mg twice a day and Farxiga 5 mg daily.  Insurance company will not  cover injectables.  We will start Rybelsus 7 mg daily.  Diet and nutrition discussed. Continue Zetia 10 mg daily Follow-up in 3 months.  Morbid obesity Weight loss recorded.  Diet and nutrition discussed. Shaquasha was seen today for diabetes.  Diagnoses and all orders for this visit:  Type 2 diabetes mellitus with hyperglycemia, without long-term current use of insulin ([facility]) -     POCT glycosylated hemoglobin (Hb A1C) -     Semaglutide (RYBELSUS) 7 MG TABS; Take 1 tablet by mouth daily.  PCOS (polycystic ovarian syndrome)  Dyslipidemia associated with type 2 diabetes mellitus ([facility])  Statin intolerance  Morbid obesity ([place])  Patient Instructions  Diabetes Mellitus and Nutrition, Adult When you have diabetes, or diabetes mellitus, it is very important to have healthy eating habits because your blood sugar (glucose) levels are greatly affected by what you eat and drink. Eating healthy foods in the right amounts, at about the same times every day, can help you: Control your blood glucose. Lower your risk of heart disease. Improve your blood pressure. Reach or maintain a healthy weight. What can affect my meal plan? Every person with diabetes is different, and each person has different needs for a meal plan. Your health care provider may recommend that you work with a dietitian to make a meal plan that is best for you. Your meal plan may vary depending on factors such as: The calories you need. The medicines you take. Your weight. Your blood glucose, blood pressure, and  cholesterol levels. Your activity level. Other health conditions you have, such as heart or kidney disease. How do carbohydrates affect me? Carbohydrates, also called carbs, affect your blood glucose level more than any other type of food. Eating carbs naturally raises the amount of glucose in your blood. Carb counting is a method for keeping track of how many carbs you eat. Counting carbs is important to keep your blood glucose at a healthy level,especially if you use insulin or take certain oral diabetes medicines. It is important to know how many carbs you can safely have in each meal. This is different for every person. Your dietitian can help you calculate how manycarbs you should have at each meal and for each snack. How does alcohol affect me? Alcohol can cause a sudden decrease in blood glucose (hypoglycemia), especially if you use insulin or take certain oral diabetes medicines. Hypoglycemia can be a life-threatening condition. Symptoms of hypoglycemia, such as sleepiness, dizziness, and confusion, are similar to symptoms of having too much alcohol. Do not drink alcohol if: Your health care provider tells you not to drink. You are pregnant, may be pregnant, or are planning to become pregnant. If you drink alcohol: Do not drink on an empty stomach. Limit how much you use to: 0-1 drink a day for women. 0-2 drinks a day for men. Be aware of how much alcohol is in your drink. In the [place] one drink equals one 12 oz bottle of beer (355 mL), one 5 oz glass of wine (148 mL), or one 1 oz glass of hard liquor (44 mL). Keep yourself hydrated with water, diet soda, or unsweetened iced tea. Keep in mind that regular soda, juice, and other mixers may contain a lot of sugar and must be counted as carbs. What are tips for following this plan?  Reading food labels Start by checking the serving size on the "Nutrition Facts" label of packaged foods and drinks. The amount of calories, carbs, fats, and  other nutrients listed  on the label is based on one serving of the item. Many items contain more than one serving per package. Check the total grams (g) of carbs in one serving. You can calculate the number of servings of carbs in one serving by dividing the total carbs by 15. For example, if a food has 30 g of total carbs per serving, it would be equal to 2 servings of carbs. Check the number of grams (g) of saturated fats and trans fats in one serving. Choose foods that have a low amount or none of these fats. Check the number of milligrams (mg) of salt (sodium) in one serving. Most people should limit total sodium intake to less than 2,300 mg per day. Always check the nutrition information of foods labeled as "low-fat" or "nonfat." These foods may be higher in added sugar or refined carbs and should be avoided. Talk to your dietitian to identify your daily goals for nutrients listed on the label. Shopping Avoid buying canned, pre-made, or processed foods. These foods tend to be high in fat, sodium, and added sugar. Shop around the outside edge of the grocery store. This is where you will most often find fresh fruits and vegetables, bulk grains, fresh meats, and fresh dairy. Cooking Use low-heat cooking methods, such as baking, instead of high-heat cooking methods like deep frying. Cook using healthy oils, such as olive, canola, or sunflower oil. Avoid cooking with butter, cream, or high-fat meats. Meal planning Eat meals and snacks regularly, preferably at the same times every day. Avoid going long periods of time without eating. Eat foods that are high in fiber, such as fresh fruits, vegetables, beans, and whole grains. Talk with your dietitian about how many servings of carbs you can eat at each meal. Eat 4-6 oz (112-168 g) of lean protein each day, such as lean meat, chicken, fish, eggs, or tofu. One ounce (oz) of lean protein is equal to: 1 oz (28 g) of meat, chicken, or fish. 1 egg.  cup  (62 g) of tofu. Eat some foods each day that contain healthy fats, such as avocado, nuts, seeds, and fish. What foods should I eat? Fruits Berries. Apples. Oranges. Peaches. Apricots. Plums. Grapes. Mango. Papaya.Pomegranate. Kiwi. Cherries. Vegetables Lettuce. Spinach. Leafy greens, including kale, chard, collard greens, and mustard greens. Beets. Cauliflower. Cabbage. Broccoli. Carrots. Green beans.Tomatoes. Peppers. Onions. Cucumbers. Brussels sprouts. Grains Whole grains, such as whole-wheat or whole-grain bread, crackers, tortillas,cereal, and pasta. Unsweetened oatmeal. Quinoa. Brown or wild rice. Meats and other proteins Seafood. Poultry without skin. Lean cuts of poultry and beef. Tofu. Nuts. Seeds. Dairy Low-fat or fat-free dairy products such as milk, yogurt, and cheese. The items listed above may not be a complete list of foods and beverages you can eat. Contact a dietitian for more information. What foods should I avoid? Fruits Fruits canned with syrup. Vegetables Canned vegetables. Frozen vegetables with butter or cream sauce. Grains Refined white flour and flour products such as bread, pasta, snack foods, andcereals. Avoid all processed foods. Meats and other proteins Fatty cuts of meat. Poultry with skin. Breaded or fried meats. Processed meat.Avoid saturated fats. Dairy Full-fat yogurt, cheese, or milk. Beverages Sweetened drinks, such as soda or iced tea. The items listed above may not be a complete list of foods and beverages you should avoid. Contact a dietitian for more information. Questions to ask a health care provider Do I need to meet with a diabetes educator? Do I need to meet with a dietitian? What number  can I call if I have questions? When are the best times to check my blood glucose? Where to find more information: American Diabetes Association: diabetes.org Academy of Nutrition and Dietetics: www.eatright.[employer] of Diabetes and  Digestive and Kidney Diseases: [place] Association of Diabetes Care and Education Specialists: www.diabeteseducator.org Summary It is important to have healthy eating habits because your blood sugar (glucose) levels are greatly affected by what you eat and drink. A healthy meal plan will help you control your blood glucose and maintain a healthy lifestyle. Your health care provider may recommend that you work with a dietitian to make a meal plan that is best for you. Keep in mind that carbohydrates (carbs) and alcohol have immediate effects on your blood glucose levels. It is important to count carbs and to use alcohol carefully. This information is not intended to replace advice given to you by your health care provider. Make sure you discuss any questions you have with your healthcare provider. Document Revised: 02/10/2019 Document Reviewed: 02/10/2019 Elsevier Patient Education  2021 [place], MD [employer] [facility]

## 2020-11-04 ENCOUNTER — Other Ambulatory Visit (HOSPITAL_COMMUNITY): Payer: Self-pay

## 2020-11-04 ENCOUNTER — Telehealth: Payer: Managed Care, Other (non HMO) | Admitting: Physician Assistant

## 2020-11-04 DIAGNOSIS — J019 Acute sinusitis, unspecified: Secondary | ICD-10-CM | POA: Diagnosis not present

## 2020-11-04 DIAGNOSIS — B9689 Other specified bacterial agents as the cause of diseases classified elsewhere: Secondary | ICD-10-CM

## 2020-11-04 MED ORDER — AMOXICILLIN-POT CLAVULANATE 875-125 MG PO TABS
1.0000 | ORAL_TABLET | Freq: Two times a day (BID) | ORAL | 0 refills | Status: DC
  Filled 2020-11-04: qty 14, 7d supply, fill #0

## 2020-11-04 NOTE — Progress Notes (Signed)

## 2020-11-11 ENCOUNTER — Telehealth: Payer: Managed Care, Other (non HMO) | Admitting: Nurse Practitioner

## 2020-11-11 DIAGNOSIS — B3731 Acute candidiasis of vulva and vagina: Secondary | ICD-10-CM

## 2020-11-11 DIAGNOSIS — B373 Candidiasis of vulva and vagina: Secondary | ICD-10-CM | POA: Diagnosis not present

## 2020-11-11 MED ORDER — FLUCONAZOLE 150 MG PO TABS
ORAL_TABLET | ORAL | 0 refills | Status: DC
Start: 2020-11-11 — End: 2020-12-14

## 2020-11-11 NOTE — Progress Notes (Signed)

## 2020-11-11 NOTE — Addendum Note (Signed)
Addended by: Chevis Pretty on: 11/11/2020 06:04 PM   Modules accepted: Orders

## 2020-11-29 ENCOUNTER — Other Ambulatory Visit (HOSPITAL_COMMUNITY): Payer: Self-pay

## 2020-11-29 ENCOUNTER — Telehealth: Payer: Self-pay | Admitting: Emergency Medicine

## 2020-11-29 ENCOUNTER — Other Ambulatory Visit: Payer: Self-pay | Admitting: Emergency Medicine

## 2020-11-29 MED ORDER — AZITHROMYCIN 250 MG PO TABS
ORAL_TABLET | ORAL | 0 refills | Status: DC
  Filled 2020-11-29: qty 6, 5d supply, fill #0

## 2020-11-29 NOTE — Telephone Encounter (Signed)
Prescription for another antibiotic, azithromycin, sent to pharmacy of record.  Recommend against use of steroids given her history of diabetes.  Thanks.

## 2020-11-29 NOTE — Telephone Encounter (Signed)
Patient was seen 08.19.22 for a sinuz infection & given antibiotic   Patient says she had a major rash reaction to antibiotic & stopped taking it after 3 days  Patient says she is still having issues w/ her sinus such as her nose feels tight & dry & when she goes to blow nose she has lots of yellow thick mucus  Wants to know if provider can send in another antibiotic or steroid to help clear up problem  Pharmacy: [facility] [place], [place] Phone:  [phone]  Fax:  [phone]

## 2020-11-30 ENCOUNTER — Other Ambulatory Visit: Payer: Self-pay | Admitting: Nurse Practitioner

## 2020-11-30 ENCOUNTER — Other Ambulatory Visit: Payer: Self-pay | Admitting: Emergency Medicine

## 2020-11-30 ENCOUNTER — Other Ambulatory Visit (HOSPITAL_COMMUNITY): Payer: Self-pay

## 2020-11-30 MED ORDER — FLUCONAZOLE 150 MG PO TABS
150.0000 mg | ORAL_TABLET | Freq: Once | ORAL | 0 refills | Status: AC
  Filled 2020-11-30: qty 1, 1d supply, fill #0

## 2020-11-30 NOTE — Telephone Encounter (Signed)
Prescription for Diflucan sent to pharmacy of record.

## 2020-12-01 NOTE — Telephone Encounter (Signed)
Called pt to inform her that a prescription for Diflucan was sent to pharmacy of record.

## 2020-12-14 ENCOUNTER — Telehealth: Payer: Managed Care, Other (non HMO) | Admitting: Family Medicine

## 2020-12-14 DIAGNOSIS — B3731 Acute candidiasis of vulva and vagina: Secondary | ICD-10-CM

## 2020-12-14 DIAGNOSIS — B373 Candidiasis of vulva and vagina: Secondary | ICD-10-CM

## 2020-12-14 MED ORDER — FLUCONAZOLE 150 MG PO TABS: ORAL_TABLET | ORAL | 0 refills | Status: DC

## 2020-12-14 NOTE — Progress Notes (Signed)
E-Visit for Vaginal Symptoms  We are sorry that you are not feeling well. Here is how we plan to help! Based on what you shared with me it looks like you: May have a yeast vaginosis  Vaginosis is an inflammation of the vagina that can result in discharge, itching and pain. The cause is usually a change in the normal balance of vaginal bacteria or an infection. Vaginosis can also result from reduced estrogen levels after menopause.  The most common causes of vaginosis are:   Bacterial vaginosis which results from an overgrowth of one on several organisms that are normally present in your vagina.   Yeast infections which are caused by a naturally occurring fungus called candida.   Vaginal atrophy (atrophic vaginosis) which results from the thinning of the vagina from reduced estrogen levels after menopause.   Trichomoniasis which is caused by a parasite and is commonly transmitted by sexual intercourse.  Factors that increase your risk of developing vaginosis include: Medications, such as antibiotics and steroids Uncontrolled diabetes Use of hygiene products such as bubble bath, vaginal spray or vaginal deodorant Douching Wearing damp or tight-fitting clothing Using an intrauterine device (IUD) for birth control Hormonal changes, such as those associated with pregnancy, birth control pills or menopause Sexual activity Having a sexually transmitted infection  Your treatment plan is Diflucan as prescribed- 1 tablet may repeat in 3 days if not better. If this fails to work this time, you will need an in office appt.  Be sure to take all of the medication as directed. Stop taking any medication if you develop a rash, tongue swelling or shortness of breath. Mothers who are breast feeding should consider pumping and discarding their breast milk while on these antibiotics. However, there is no consensus that infant exposure at these doses would be harmful.  Remember that medication creams can  weaken latex condoms. [place] Kitchen   HOME CARE:  Good hygiene may prevent some types of vaginosis from recurring and may relieve some symptoms:  Avoid baths, hot tubs and whirlpool spas. Rinse soap from your outer genital area after a shower, and dry the area well to prevent irritation. Don't use scented or harsh soaps, such as those with deodorant or antibacterial action. Avoid irritants. These include scented tampons and pads. Wipe from front to back after using the toilet. Doing so avoids spreading fecal bacteria to your vagina.  Other things that may help prevent vaginosis include:  Don't douche. Your vagina doesn't require cleansing other than normal bathing. Repetitive douching disrupts the normal organisms that reside in the vagina and can actually increase your risk of vaginal infection. Douching won't clear up a vaginal infection. Use a latex condom. Both female and female latex condoms may help you avoid infections spread by sexual contact. Wear cotton underwear. Also wear pantyhose with a cotton crotch. If you feel comfortable without it, skip wearing underwear to bed. Yeast thrives in [employer] Your symptoms should improve in the next day or two.  GET HELP RIGHT AWAY IF:  You have pain in your lower abdomen ( pelvic area or over your ovaries) You develop nausea or vomiting You develop a fever Your discharge changes or worsens You have persistent pain with intercourse You develop shortness of breath, a rapid pulse, or you faint.  These symptoms could be signs of problems or infections that need to be evaluated by a medical provider now.  MAKE SURE YOU   Understand these instructions. Will watch your condition. Will get help  right away if you are not doing well or get worse.  Thank you for choosing an e-visit.  Your e-visit answers were reviewed by a board certified advanced clinical practitioner to complete your personal care plan. Depending upon the condition, your plan  could have included both over the counter or prescription medications.  Please review your pharmacy choice. Make sure the pharmacy is open so you can pick up prescription now. If there is a problem, you may contact your provider through [employer] and have the prescription routed to another pharmacy.  Your safety is important to [place]. If you have drug allergies check your prescription carefully.   For the next 24 hours you can use MyChart to ask questions about today's visit, request a non-urgent call back, or ask for a work or school excuse. You will get an email in the next two days asking about your experience. I hope that your e-visit has been valuable and will speed your recovery.   I provided 5 minutes of non face-to-face time during this encounter for chart review, medication and order placement, as well as and documentation.

## 2020-12-22 ENCOUNTER — Other Ambulatory Visit: Payer: Self-pay

## 2020-12-22 ENCOUNTER — Other Ambulatory Visit (HOSPITAL_COMMUNITY): Payer: Self-pay

## 2020-12-22 ENCOUNTER — Ambulatory Visit (INDEPENDENT_AMBULATORY_CARE_PROVIDER_SITE_OTHER): Payer: Managed Care, Other (non HMO) | Admitting: Physician Assistant

## 2020-12-22 DIAGNOSIS — L309 Dermatitis, unspecified: Secondary | ICD-10-CM | POA: Diagnosis not present

## 2020-12-22 MED ORDER — TRIAMCINOLONE ACETONIDE 0.1 % EX CREA
TOPICAL_CREAM | CUTANEOUS | 5 refills | Status: AC
  Filled 2020-12-22: qty 454, 30d supply, fill #0

## 2020-12-22 MED ORDER — TACROLIMUS 0.1 % EX OINT
TOPICAL_OINTMENT | CUTANEOUS | 0 refills | Status: DC
  Filled 2020-12-22: qty 100, 90d supply, fill #0

## 2020-12-22 MED ORDER — ALCLOMETASONE DIPROPIONATE 0.05 % EX CREA
TOPICAL_CREAM | CUTANEOUS | 3 refills | Status: AC
  Filled 2020-12-22: qty 60, 30d supply, fill #0

## 2020-12-30 ENCOUNTER — Encounter: Payer: Self-pay | Admitting: Physician Assistant

## 2020-12-30 NOTE — Progress Notes (Signed)
   New Patient   Subjective  Maria Maddox is a 35 y.o. female who presents for the following: Eczema (Here for eczema. It is getting worse per patient. Used to be on the arms but now patient says its on her face. It does burn throughout the day when she is sweating. Treatment is just OTC cream. Patient has never had prescription cream. ).   The following portions of the chart were reviewed this encounter and updated as appropriate:  Tobacco  Allergies  Meds  Problems  Med Hx  Surg Hx  Fam Hx      Objective  Well appearing patient in no apparent distress; mood and affect are within normal limits.  A full examination was performed including scalp, head, eyes, ears, nose, lips, neck, chest, axillae, abdomen, back, buttocks, bilateral upper extremities, bilateral lower extremities, hands, feet, fingers, toes, fingernails, and toenails. All findings within normal limits unless otherwise noted below.  Left Forearm - Anterior, Left Malar Cheek, Mid Forehead, Right Lower Leg - Anterior, Right Malar Cheek, Right Upper Arm - Anterior Thin scaly erythematous plaques.    Assessment & Plan  Eczema, unspecified type Right Upper Arm - Anterior; Left Forearm - Anterior; Right Lower Leg - Anterior; Mid Forehead; Left Malar Cheek; Right Malar Cheek  alclomethasone (ACLOVATE) 0.05 % cream - Left Forearm - Anterior, Left Malar Cheek, Mid Forehead, Right Lower Leg - Anterior, Right Malar Cheek, Right Upper Arm - Anterior Apply to face every day- DO NOT APPLY TO RIGHT EYE DUE TO GLAUCOMA  tacrolimus (PROTOPIC) 0.1 % ointment - Left Forearm - Anterior, Left Malar Cheek, Mid Forehead, Right Lower Leg - Anterior, Right Malar Cheek, Right Upper Arm - Anterior Apply to face every day  triamcinolone cream (KENALOG) 0.1 % - Left Forearm - Anterior, Left Malar Cheek, Mid Forehead, Right Lower Leg - Anterior, Right Malar Cheek, Right Upper Arm - Anterior Apply to affected area every day for body- NOT FOR  FACE, FOLDS or GROIN     I, Sundae Maners, PA-C, have reviewed all documentation's for this visit.  The documentation on 12/30/20 for the exam, diagnosis, procedures and orders are all accurate and complete.

## 2021-01-01 ENCOUNTER — Telehealth: Payer: Managed Care, Other (non HMO) | Admitting: Nurse Practitioner

## 2021-01-01 DIAGNOSIS — B3731 Acute candidiasis of vulva and vagina: Secondary | ICD-10-CM

## 2021-01-01 MED ORDER — FLUCONAZOLE 150 MG PO TABS: ORAL_TABLET | ORAL | 0 refills | Status: DC

## 2021-01-01 NOTE — Progress Notes (Signed)

## 2021-01-01 NOTE — Progress Notes (Signed)
I have spent 5 minutes in review of e-visit questionnaire, review and updating patient chart, medical decision making and response to patient.  ° °Atif Chapple W Korin Setzler, NP ° °  °

## 2021-01-09 NOTE — Addendum Note (Signed)
Addended by: Robyne Askew R on: 01/09/2021 01:17 PM   Modules accepted: Level of Service

## 2021-01-14 ENCOUNTER — Telehealth: Payer: Managed Care, Other (non HMO) | Admitting: Emergency Medicine

## 2021-01-14 DIAGNOSIS — N898 Other specified noninflammatory disorders of vagina: Secondary | ICD-10-CM

## 2021-01-14 MED ORDER — FLUCONAZOLE 150 MG PO TABS: 150.0000 mg | ORAL_TABLET | Freq: Every day | ORAL | 0 refills | Status: DC

## 2021-01-14 NOTE — Progress Notes (Signed)

## 2021-01-14 NOTE — Progress Notes (Signed)
I have spent 5 minutes in review of e-visit questionnaire, review and updating patient chart, medical decision making and response to patient.   Jyra Lagares, PA-C    

## 2021-01-31 ENCOUNTER — Telehealth: Payer: Managed Care, Other (non HMO) | Admitting: Physician Assistant

## 2021-01-31 ENCOUNTER — Ambulatory Visit: Payer: Managed Care, Other (non HMO) | Admitting: Emergency Medicine

## 2021-01-31 DIAGNOSIS — N760 Acute vaginitis: Secondary | ICD-10-CM

## 2021-01-31 MED ORDER — FLUCONAZOLE 150 MG PO TABS: ORAL_TABLET | ORAL | 0 refills | Status: DC

## 2021-01-31 NOTE — Progress Notes (Signed)

## 2021-02-14 ENCOUNTER — Other Ambulatory Visit: Payer: Self-pay | Admitting: Emergency Medicine

## 2021-02-14 ENCOUNTER — Telehealth: Payer: Managed Care, Other (non HMO) | Admitting: Physician Assistant

## 2021-02-14 ENCOUNTER — Other Ambulatory Visit (HOSPITAL_COMMUNITY): Payer: Self-pay

## 2021-02-14 DIAGNOSIS — E1169 Type 2 diabetes mellitus with other specified complication: Secondary | ICD-10-CM

## 2021-02-14 DIAGNOSIS — E1165 Type 2 diabetes mellitus with hyperglycemia: Secondary | ICD-10-CM

## 2021-02-14 DIAGNOSIS — N760 Acute vaginitis: Secondary | ICD-10-CM

## 2021-02-14 MED ORDER — DAPAGLIFLOZIN PROPANEDIOL 5 MG PO TABS
5.0000 mg | ORAL_TABLET | Freq: Every day | ORAL | 3 refills | Status: DC
  Filled 2021-02-14: qty 90, 90d supply, fill #0

## 2021-02-14 MED ORDER — EZETIMIBE 10 MG PO TABS
10.0000 mg | ORAL_TABLET | Freq: Every day | ORAL | 3 refills | Status: DC
  Filled 2021-02-14 – 2021-03-06 (×2): qty 90, 90d supply, fill #0
  Filled 2021-06-22: qty 90, 90d supply, fill #1
  Filled 2021-10-02: qty 90, 90d supply, fill #2
  Filled 2022-01-04: qty 90, 90d supply, fill #3

## 2021-02-14 MED ORDER — METFORMIN HCL 500 MG PO TABS
1000.0000 mg | ORAL_TABLET | Freq: Two times a day (BID) | ORAL | 3 refills | Status: DC
  Filled 2021-02-14 – 2021-03-06 (×2): qty 180, 45d supply, fill #0
  Filled 2021-06-22: qty 180, 45d supply, fill #1
  Filled 2021-10-02: qty 180, 45d supply, fill #2
  Filled 2022-01-04 (×2): qty 180, 45d supply, fill #3

## 2021-02-14 NOTE — Progress Notes (Signed)
Based on what you shared with me, I feel your condition warrants further evaluation and I recommend that you be seen for a face to face visit.  Please contact your primary care physician practice to be seen. Many offices offer virtual options to be seen via video if you are not comfortable going in person to a medical facility at this time.  Giving recurring vaginal itching/vaginosis with several e-visits for this within the past month, you will need to be evaluated in person, either with your PCP, GYN or at a local Urgent Care for further assessment to make sure the cause of itching is verified and proper course of treatment/follow-up is given.   NOTE: You will NOT be charged for this eVisit.  If you do not have a PCP, [facility] offers a free physician referral service available at [phone]. Our trained staff has the experience, knowledge and resources to put you in touch with a physician who is right for you.    If you are having a true medical emergency please call 911.   Your e-visit answers were reviewed by a board certified advanced clinical practitioner to complete your personal care plan.  Thank you for using e-Visits.

## 2021-02-22 ENCOUNTER — Other Ambulatory Visit (HOSPITAL_COMMUNITY): Payer: Self-pay

## 2021-03-06 ENCOUNTER — Encounter: Payer: Self-pay | Admitting: Emergency Medicine

## 2021-03-06 ENCOUNTER — Other Ambulatory Visit: Payer: Self-pay

## 2021-03-06 ENCOUNTER — Other Ambulatory Visit (HOSPITAL_COMMUNITY): Payer: Self-pay

## 2021-03-06 ENCOUNTER — Ambulatory Visit (INDEPENDENT_AMBULATORY_CARE_PROVIDER_SITE_OTHER): Payer: Managed Care, Other (non HMO) | Admitting: Emergency Medicine

## 2021-03-06 VITALS — BP 118/60 | HR 82 | Temp 98.7°F | Ht 66.0 in | Wt 264.0 lb

## 2021-03-06 DIAGNOSIS — E1169 Type 2 diabetes mellitus with other specified complication: Secondary | ICD-10-CM

## 2021-03-06 DIAGNOSIS — E1165 Type 2 diabetes mellitus with hyperglycemia: Secondary | ICD-10-CM | POA: Diagnosis not present

## 2021-03-06 DIAGNOSIS — E785 Hyperlipidemia, unspecified: Secondary | ICD-10-CM | POA: Diagnosis not present

## 2021-03-06 DIAGNOSIS — N898 Other specified noninflammatory disorders of vagina: Secondary | ICD-10-CM

## 2021-03-06 LAB — POCT GLYCOSYLATED HEMOGLOBIN (HGB A1C): Hemoglobin A1C: 8.9 % — AB (ref 4.0–5.6)

## 2021-03-06 MED ORDER — DAPAGLIFLOZIN PROPANEDIOL 10 MG PO TABS
10.0000 mg | ORAL_TABLET | Freq: Every day | ORAL | 3 refills | Status: DC
  Filled 2021-03-06: qty 90, 90d supply, fill #0
  Filled 2021-06-22: qty 90, 90d supply, fill #1
  Filled 2021-10-02: qty 90, 90d supply, fill #2

## 2021-03-06 MED ORDER — FLUCONAZOLE 150 MG PO TABS
150.0000 mg | ORAL_TABLET | ORAL | 3 refills | Status: DC
Start: 2021-03-06 — End: 2021-05-17
  Filled 2021-03-06: qty 1, 1d supply, fill #0
  Filled 2021-03-25: qty 1, 1d supply, fill #1
  Filled 2021-03-30: qty 1, 1d supply, fill #2
  Filled 2021-05-04: qty 1, 1d supply, fill #3

## 2021-03-06 MED ORDER — RYBELSUS 14 MG PO TABS
14.0000 mg | ORAL_TABLET | Freq: Every day | ORAL | 3 refills | Status: DC
  Filled 2021-03-06: qty 90, 90d supply, fill #0
  Filled 2021-06-22 (×2): qty 90, 90d supply, fill #1

## 2021-03-06 NOTE — Patient Instructions (Signed)

## 2021-03-06 NOTE — Assessment & Plan Note (Signed)
Uncontrolled diabetes with hemoglobin A1c at 8.9. Continue metformin 1000 mg twice a day. Increase Farxiga to 10 mg daily. Increase Rybelsus to 14 mg daily. Diet and nutrition discussed. Intolerant to statins. Continue Zetia 10 mg daily. Follow-up in 3 months.

## 2021-03-06 NOTE — Progress Notes (Signed)
Maria Maddox 35 y.o.   Chief Complaint  Patient presents with   Diabetes    3 month f/u   Vaginal Itching    Pt states she is having vaginal irration, pt thinks it from diabetic, pt would be interested in a medication cream    HISTORY OF PRESENT ILLNESS: This is a 35 y.o. female here for 14-month follow-up of diabetes. Has chronic intermittent vaginal itching. No other complaints or medical concerns today. Lab Results  Component Value Date   HGBA1C 7.5 (A) 10/31/2020     HPI   Prior to Admission medications   Medication Sig Start Date End Date Taking? Authorizing Provider  AFLURIA QUADRIVALENT 0.5 ML injection  01/19/19   [provider]  alclomethasone (ACLOVATE) 0.05 % cream Apply to face every day- DO NOT APPLY TO RIGHT EYE DUE TO GLAUCOMA 12/22/20   Sheffield, Vida Roller R, PA-C  atropine 1 % ophthalmic solution as needed.    [provider]  brimonidine (ALPHAGAN P) 0.1 % SOLN Place 1 drop into both eyes 2 (two) times daily.    [provider]  dapagliflozin propanediol (FARXIGA) 5 MG TABS tablet Take 1 tablet (5 mg total) by mouth daily. 02/14/21   Horald Pollen, MD  dorzolamide-timolol (COSOPT) 22.3-6.8 MG/ML ophthalmic solution Place 1 drop into the right eye daily.    [provider]  ezetimibe (ZETIA) 10 MG tablet TAKE 1 TABLET (10 MG TOTAL) BY MOUTH DAILY. 02/14/21   Horald Pollen, MD  fluconazole (DIFLUCAN) 150 MG tablet Take 1 tablet (150 mg total) by mouth daily. Separate doses by 72 hours Patient not taking: Reported on 03/06/2021 01/14/21   Wurst, [place], PA-C  fluconazole (DIFLUCAN) 150 MG tablet Take one tablet on the day you fill the prescription. If you continue to have symptoms then take the second tablet in 3 days. Patient not taking: Reported on 03/06/2021 01/31/21   Couture, Cortni S, PA-C  metFORMIN (GLUCOPHAGE) 500 MG tablet Take 2 tablets (1,000 mg total) by mouth 2 (two) times daily with a meal. 02/14/21    Calle Schader, Ines Bloomer, MD  Semaglutide (RYBELSUS) 7 MG TABS Take 1 tablet by mouth daily. 10/31/20   Horald Pollen, MD  tacrolimus (PROTOPIC) 0.1 % ointment Apply to face every day 12/22/20   Warren Danes, PA-C  timolol (TIMOPTIC) 0.25 % ophthalmic solution  04/23/19   [provider]  triamcinolone cream (KENALOG) 0.1 % Apply to affected area every day for body- NOT FOR FACE, FOLDS or GROIN 12/22/20   Robyne Askew R, PA-C    Allergies  Allergen Reactions   Crestor [Rosuvastatin Calcium] Other (See Comments)    Per patient body pain   Amoxicillin Rash    bad yeast infection    Patient Active Problem List   Diagnosis Date Noted   Statin intolerance 02/04/2020   Body mass index (BMI) of 39.0-39.9 in adult 02/04/2020   Dyslipidemia associated with type 2 diabetes mellitus ([place]) 10/14/2016   Morbid obesity ([place]) 01/15/2013   Polycystic ovaries 01/15/2013   Glaucoma 07/02/2011    Past Medical History:  Diagnosis Date   Eczema    Glaucoma    has had for 10 yrears   Legally blind in right eye, as defined in [place]    PCOS (polycystic ovarian syndrome)     Past Surgical History:  Procedure Laterality Date   EYE SURGERY     right side, numerous surgeries (now blind)   PILONIDAL CYST EXCISION N/A 10/26/2015  Procedure: CYST EXCISION PILONIDAL;  Surgeon: Coralie Keens, MD;  Location: [facility];  Service: General;  Laterality: N/A;    Social History   Socioeconomic History   Marital status: Married    Spouse name: Not on file   Number of children: Not on file   Years of education: Not on file   Highest education level: Not on file  Occupational History   Not on file  Tobacco Use   Smoking status: Never   Smokeless tobacco: Never  Substance and Sexual Activity   Alcohol use: No   Drug use: No   Sexual activity: Yes    Birth control/protection: None  Other Topics Concern   Not on file  Social History Narrative   Not on file    Social Determinants of Health   Financial Resource Strain: Not on file  Food Insecurity: Not on file  Transportation Needs: Not on file  Physical Activity: Not on file  Stress: Not on file  Social Connections: Not on file  Intimate Partner Violence: Not on file    Family History  Problem Relation Age of Onset   Arthritis Mother    Depression Mother    Diabetes Mother    Hyperlipidemia Mother    Arthritis Father    Cancer Maternal Grandmother        breast   Diabetes Paternal Grandmother    Diabetes Paternal Grandfather    Hearing loss Paternal Grandfather    Cancer Maternal Aunt 82       Breast Cancer     Review of Systems  Constitutional: Negative.  Negative for chills and fever.  HENT: Negative.  Negative for congestion and sore throat.   Respiratory: Negative.  Negative for cough and shortness of breath.   Cardiovascular: Negative.  Negative for chest pain and palpitations.  Gastrointestinal: Negative.  Negative for abdominal pain, diarrhea, nausea and vomiting.  Genitourinary: Negative.  Negative for dysuria and hematuria.       Chronic intermittent vaginal itching  Skin: Negative.  Negative for rash.  Neurological: Negative.  Negative for dizziness and headaches.  All other systems reviewed and are negative.  Today's Vitals   03/06/21 0950  BP: 118/60  Pulse: 82  Temp: 98.7 F (37.1 C)  TempSrc: Oral  SpO2: 95%  Weight: 264 lb (119.7 kg)  Height: 5\' 6"  (1.676 m)   Body mass index is 42.61 kg/m. Wt Readings from Last 3 Encounters:  03/06/21 264 lb (119.7 kg)  10/31/20 260 lb (117.9 kg)  07/07/20 267 lb 12.8 oz (121.5 kg)    Physical Exam Vitals reviewed.  Constitutional:      Appearance: Normal appearance. She is obese.  HENT:     Head: Normocephalic.  Eyes:     Extraocular Movements: Extraocular movements intact.     Pupils: Pupils are equal, round, and reactive to light.  Cardiovascular:     Rate and Rhythm: Normal rate and regular  rhythm.     Heart sounds: Normal heart sounds.  Pulmonary:     Effort: Pulmonary effort is normal.     Breath sounds: Normal breath sounds.  Musculoskeletal:        General: Normal range of motion.     Cervical back: Normal range of motion.  Skin:    General: Skin is warm and dry.     Capillary Refill: Capillary refill takes less than 2 seconds.  Neurological:     General: No focal deficit present.     Mental  Status: She is alert and oriented to person, place, and time.  Psychiatric:        Mood and Affect: Mood normal.        Behavior: Behavior normal.     ASSESSMENT & PLAN: A total of 47 minutes was spent with the patient and counseling/coordination of care regarding preparing for this visit, review of most recent office visit notes, review of most recent blood work results including today's hemoglobin A1c which is elevated, review of all medications and changes made including increasing doses of both Farxiga and Rybelsus, cardiovascular risks associated with uncontrolled diabetes, education on nutrition and need to reduce amount of daily carbohydrate intake, management of chronic vaginal itching with Diflucan, prognosis, documentation and need for follow-up in 3 months.  Problem List Items Addressed This Visit       Endocrine   Dyslipidemia associated with type 2 diabetes mellitus ([place])    Uncontrolled diabetes with hemoglobin A1c at 8.9. Continue metformin 1000 mg twice a day. Increase Farxiga to 10 mg daily. Increase Rybelsus to 14 mg daily. Diet and nutrition discussed. Intolerant to statins. Continue Zetia 10 mg daily. Follow-up in 3 months.      Relevant Medications   Semaglutide (RYBELSUS) 14 MG TABS   dapagliflozin propanediol (FARXIGA) 10 MG TABS tablet   Other Visit Diagnoses     Type 2 diabetes mellitus with hyperglycemia, without long-term current use of insulin (HCC)    -  Primary   Relevant Medications   Semaglutide (RYBELSUS) 14 MG TABS   dapagliflozin  propanediol (FARXIGA) 10 MG TABS tablet   Other Relevant Orders   POCT glycosylated hemoglobin (Hb A1C) (Completed)   Microalbumin, urine   Vaginal itching          Patient Instructions  Diabetes Mellitus and Nutrition, Adult When you have diabetes, or diabetes mellitus, it is very important to have healthy eating habits because your blood sugar (glucose) levels are greatly affected by what you eat and drink. Eating healthy foods in the right amounts, at about the same times every day, can help you: Manage your blood glucose. Lower your risk of heart disease. Improve your blood pressure. Reach or maintain a healthy weight. What can affect my meal plan? Every person with diabetes is different, and each person has different needs for a meal plan. Your health care provider may recommend that you work with a dietitian to make a meal plan that is best for you. Your meal plan may vary depending on factors such as: The calories you need. The medicines you take. Your weight. Your blood glucose, blood pressure, and cholesterol levels. Your activity level. Other health conditions you have, such as heart or kidney disease. How do carbohydrates affect me? Carbohydrates, also called carbs, affect your blood glucose level more than any other type of food. Eating carbs raises the amount of glucose in your blood. It is important to know how many carbs you can safely have in each meal. This is different for every person. Your dietitian can help you calculate how many carbs you should have at each meal and for each snack. How does alcohol affect me? Alcohol can cause a decrease in blood glucose (hypoglycemia), especially if you use insulin or take certain diabetes medicines by mouth. Hypoglycemia can be a life-threatening condition. Symptoms of hypoglycemia, such as sleepiness, dizziness, and confusion, are similar to symptoms of having too much alcohol. Do not drink alcohol if: Your health care provider  tells you not to drink.  You are pregnant, may be pregnant, or are planning to become pregnant. If you drink alcohol: Limit how much you have to: 0-1 drink a day for women. 0-2 drinks a day for men. Know how much alcohol is in your drink. In the [place] one drink equals one 12 oz bottle of beer (355 mL), one 5 oz glass of wine (148 mL), or one 1 oz glass of hard liquor (44 mL). Keep yourself hydrated with water, diet soda, or unsweetened iced tea. Keep in mind that regular soda, juice, and other mixers may contain a lot of sugar and must be counted as carbs. What are tips for following this plan? Reading food labels Start by checking the serving size on the Nutrition Facts label of packaged foods and drinks. The number of calories and the amount of carbs, fats, and other nutrients listed on the label are based on one serving of the item. Many items contain more than one serving per package. Check the total grams (g) of carbs in one serving. Check the number of grams of saturated fats and trans fats in one serving. Choose foods that have a low amount or none of these fats. Check the number of milligrams (mg) of salt (sodium) in one serving. Most people should limit total sodium intake to less than 2,300 mg per day. Always check the nutrition information of foods labeled as "low-fat" or "nonfat." These foods may be higher in added sugar or refined carbs and should be avoided. Talk to your dietitian to identify your daily goals for nutrients listed on the label. Shopping Avoid buying canned, pre-made, or processed foods. These foods tend to be high in fat, sodium, and added sugar. Shop around the outside edge of the grocery store. This is where you will most often find fresh fruits and vegetables, bulk grains, fresh meats, and fresh dairy products. Cooking Use low-heat cooking methods, such as baking, instead of high-heat cooking methods, such as deep frying. Cook using healthy oils, such as olive,  canola, or sunflower oil. Avoid cooking with butter, cream, or high-fat meats. Meal planning Eat meals and snacks regularly, preferably at the same times every day. Avoid going long periods of time without eating. Eat foods that are high in fiber, such as fresh fruits, vegetables, beans, and whole grains. Eat 4-6 oz (112-168 g) of lean protein each day, such as lean meat, chicken, fish, eggs, or tofu. One ounce (oz) (28 g) of lean protein is equal to: 1 oz (28 g) of meat, chicken, or fish. 1 egg.  cup (62 g) of tofu. Eat some foods each day that contain healthy fats, such as avocado, nuts, seeds, and fish. What foods should I eat? Fruits Berries. Apples. Oranges. Peaches. Apricots. Plums. Grapes. Mangoes. Papayas. Pomegranates. Kiwi. Cherries. Vegetables Leafy greens, including lettuce, spinach, kale, chard, collard greens, mustard greens, and cabbage. Beets. Cauliflower. Broccoli. Carrots. Green beans. Tomatoes. Peppers. Onions. Cucumbers. Brussels sprouts. Grains Whole grains, such as whole-wheat or whole-grain bread, crackers, tortillas, cereal, and pasta. Unsweetened oatmeal. Quinoa. Brown or wild rice. Meats and other proteins Seafood. Poultry without skin. Lean cuts of poultry and beef. Tofu. Nuts. Seeds. Dairy Low-fat or fat-free dairy products such as milk, yogurt, and cheese. The items listed above may not be a complete list of foods and beverages you can eat and drink. Contact a dietitian for more information. What foods should I avoid? Fruits Fruits canned with syrup. Vegetables Canned vegetables. Frozen vegetables with butter or cream sauce. Grains Refined white  flour and flour products such as bread, pasta, snack foods, and cereals. Avoid all processed foods. Meats and other proteins Fatty cuts of meat. Poultry with skin. Breaded or fried meats. Processed meat. Avoid saturated fats. Dairy Full-fat yogurt, cheese, or milk. Beverages Sweetened drinks, such as soda or  iced tea. The items listed above may not be a complete list of foods and beverages you should avoid. Contact a dietitian for more information. Questions to ask a health care provider Do I need to meet with a certified diabetes care and education specialist? Do I need to meet with a dietitian? What number can I call if I have questions? When are the best times to check my blood glucose? Where to find more information: American Diabetes Association: diabetes.org Academy of Nutrition and Dietetics: eatright.[employer] of Diabetes and Digestive and Kidney Diseases: [employer] Association of Diabetes Care & Education Specialists: diabeteseducator.org Summary It is important to have healthy eating habits because your blood sugar (glucose) levels are greatly affected by what you eat and drink. It is important to use alcohol carefully. A healthy meal plan will help you manage your blood glucose and lower your risk of heart disease. Your health care provider may recommend that you work with a dietitian to make a meal plan that is best for you. This information is not intended to replace advice given to you by your health care provider. Make sure you discuss any questions you have with your health care provider. Document Revised: 10/07/2019 Document Reviewed: 10/07/2019 Elsevier Patient Education  2022 [place], [place] [facility]

## 2021-03-07 LAB — MICROALBUMIN, URINE: Microalb, Ur: 5.9 mg/dL

## 2021-03-27 ENCOUNTER — Other Ambulatory Visit (HOSPITAL_COMMUNITY): Payer: Self-pay

## 2021-03-30 ENCOUNTER — Other Ambulatory Visit (HOSPITAL_COMMUNITY): Payer: Self-pay

## 2021-05-04 ENCOUNTER — Other Ambulatory Visit (HOSPITAL_COMMUNITY): Payer: Self-pay

## 2021-05-17 ENCOUNTER — Other Ambulatory Visit: Payer: Self-pay | Admitting: Emergency Medicine

## 2021-05-17 ENCOUNTER — Other Ambulatory Visit (HOSPITAL_COMMUNITY): Payer: Self-pay

## 2021-05-17 MED ORDER — FLUCONAZOLE 150 MG PO TABS
150.0000 mg | ORAL_TABLET | ORAL | 3 refills | Status: DC
  Filled 2021-05-17: qty 2, 3d supply, fill #0
  Filled 2021-06-08: qty 2, 3d supply, fill #1
  Filled 2021-06-22: qty 2, 3d supply, fill #2
  Filled 2021-07-07: qty 2, 3d supply, fill #3

## 2021-06-06 ENCOUNTER — Other Ambulatory Visit (HOSPITAL_COMMUNITY): Payer: Self-pay

## 2021-06-06 ENCOUNTER — Ambulatory Visit (HOSPITAL_COMMUNITY)
Admission: EM | Admit: 2021-06-06 | Discharge: 2021-06-06 | Disposition: A | Discharge status: Home / Self Care | Payer: Managed Care, Other (non HMO) | Attending: Physician Assistant | Admitting: Physician Assistant

## 2021-06-06 ENCOUNTER — Encounter (HOSPITAL_COMMUNITY): Payer: Self-pay

## 2021-06-06 DIAGNOSIS — Z3202 Encounter for pregnancy test, result negative: Secondary | ICD-10-CM

## 2021-06-06 DIAGNOSIS — R42 Dizziness and giddiness: Secondary | ICD-10-CM | POA: Diagnosis present

## 2021-06-06 LAB — CBG MONITORING, ED: Glucose-Capillary: 136 mg/dL — ABNORMAL HIGH (ref 70–99)

## 2021-06-06 LAB — COMPREHENSIVE METABOLIC PANEL
ALT: 14 U/L (ref 0–44)
AST: 15 U/L (ref 15–41)
Albumin: 3.3 g/dL — ABNORMAL LOW (ref 3.5–5.0)
Alkaline Phosphatase: 51 U/L (ref 38–126)
Anion gap: 9 (ref 5–15)
BUN: 6 mg/dL (ref 6–20)
CO2: 22 mmol/L (ref 22–32)
Calcium: 8.9 mg/dL (ref 8.9–10.3)
Chloride: 107 mmol/L (ref 98–111)
Creatinine, Ser: 0.76 mg/dL (ref 0.44–1.00)
GFR, Estimated: >60 mL/min (ref >60)
Glucose, Bld: 132 mg/dL — ABNORMAL HIGH (ref 70–99)
Potassium: 3.3 mmol/L — ABNORMAL LOW (ref 3.5–5.1)
Sodium: 138 mmol/L (ref 135–145)
Total Bilirubin: 0.5 mg/dL (ref 0.3–1.2)
Total Protein: 7.9 g/dL (ref 6.5–8.1)

## 2021-06-06 LAB — POCT URINALYSIS DIPSTICK, ED / UC
Bilirubin Urine: NEGATIVE
Glucose, UA: >=1000 mg/dL — AB
Ketones, ur: NEGATIVE mg/dL
Leukocytes,Ua: NEGATIVE
Nitrite: NEGATIVE
Protein, ur: NEGATIVE mg/dL
Specific Gravity, Urine: 1.015 (ref 1.005–1.030)
Urobilinogen, UA: 0.2 mg/dL (ref 0.0–1.0)
pH: 5 (ref 5.0–8.0)

## 2021-06-06 LAB — CBC
HCT: 42.4 % (ref 36.0–46.0)
Hemoglobin: 13.6 g/dL (ref 12.0–15.0)
MCH: 26.9 pg (ref 26.0–34.0)
MCHC: 32.1 g/dL (ref 30.0–36.0)
MCV: 84 fL (ref 80.0–100.0)
Platelets: 294 10*3/uL (ref 150–400)
RBC: 5.05 MIL/uL (ref 3.87–5.11)
RDW: 13.7 % (ref 11.5–15.5)
WBC: 5.2 10*3/uL (ref 4.0–10.5)
nRBC: 0 % (ref 0.0–0.2)

## 2021-06-06 LAB — POC URINE PREG, ED: Preg Test, Ur: NEGATIVE

## 2021-06-06 MED ORDER — POTASSIUM CHLORIDE ER 10 MEQ PO TBCR
10.0000 meq | EXTENDED_RELEASE_TABLET | Freq: Two times a day (BID) | ORAL | 0 refills | Status: DC
  Filled 2021-06-06: qty 10, 5d supply, fill #0

## 2021-06-06 NOTE — ED Notes (Signed)
Evaluated by erin pa ?

## 2021-06-06 NOTE — Discharge Instructions (Signed)
Follow-up with your primary care doctor as soon as possible.  Make sure you are drinking plenty of fluid and eating small frequent meals.  Monitor your blood sugar closely.  You had a fluctuation in your blood pressure when you went from a lying to standing position and I think it is worthwhile to discuss with your primary care provider seeing a cardiologist.  If you have any worsening symptoms including passing out, chest pain, shortness of breath, lightheadedness that is more persistent, vision changes, weakness you need to go to the emergency room as we discussed. ?

## 2021-06-06 NOTE — ED Provider Notes (Signed)
[facility]    CSN: [id number] Arrival date & time: 06/06/21  1059      History   Chief Complaint Chief Complaint  Patient presents with   Dizziness    HPI Maria Maddox is a 36 y.o. female.   Patient presents today with a 2-day history of intermittent lightheadedness/dizziness episodes.  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  She denies any syncopal episode.  She had a second episode earlier today when she was driving where she felt the room was spinning and her vision blurred requiring her to pull over.  She is blind in her right eye and so became frightened when symptoms involved vision of her left eye.  This episode lasted for few minutes and then resolved without intervention.  She denies any recent head injury.  Denies any medication changes.  She does have a history of diabetes but denies labile blood sugars or recurrent episodes of hypoglycemia.  She denies any dietary changes.  She does report over the past several months she has had an increased frequency of taking the steps and almost falling but denies any significant falls or head injuries associated with this.  She denies any history of neurological condition including multiple sclerosis or recurrent migraines.  She denies history of arrhythmia.  She denies any recent illness.  She has been having some seasonal allergy symptoms but reports dizziness is not uncommon symptom with her allergies in the past.   Past Medical History:  Diagnosis Date   Eczema    Glaucoma    has had for 10 yrears   Legally blind in right eye, as defined in [place]    PCOS (polycystic ovarian syndrome)     Patient Active Problem List   Diagnosis Date Noted   Statin intolerance 02/04/2020   Body mass index (BMI) of 39.0-39.9 in adult 02/04/2020   Dyslipidemia associated with type 2 diabetes mellitus (HCC) 10/14/2016   Morbid obesity  (HCC) 01/15/2013   Polycystic ovaries 01/15/2013   Glaucoma 07/02/2011    Past Surgical History:  Procedure Laterality Date   EYE SURGERY     right side, numerous surgeries (now blind)   PILONIDAL CYST EXCISION N/A 10/26/2015   Procedure: CYST EXCISION PILONIDAL;  Surgeon: Abigail Miyamoto, MD;  Location: [facility];  Service: General;  Laterality: N/A;    OB History   No obstetric history on file.      Home Medications    Prior to Admission medications   Medication Sig Start Date End Date Taking? Authorizing Provider  AFLURIA QUADRIVALENT 0.5 ML injection  01/19/19   [provider]  alclomethasone (ACLOVATE) 0.05 % cream Apply to face every day- DO NOT APPLY TO RIGHT EYE DUE TO GLAUCOMA 12/22/20   Sheffield, Harvin Hazel R, PA-C  atropine 1 % ophthalmic solution as needed.    [provider]  brimonidine (ALPHAGAN P) 0.1 % SOLN Place 1 drop into both eyes 2 (two) times daily.    [provider]  dapagliflozin propanediol (FARXIGA) 10 MG TABS tablet Take 1 tablet (10 mg total) by mouth daily before breakfast. 03/06/21   Georgina Quint, MD  dorzolamide-timolol (COSOPT) 22.3-6.8 MG/ML ophthalmic solution Place 1 drop into the right eye daily.    [provider]  ezetimibe (ZETIA) 10 MG tablet TAKE 1 TABLET (10 MG TOTAL) BY MOUTH DAILY. 02/14/21   Sagardia,  Eilleen Kempf, MD  fluconazole (DIFLUCAN) 150 MG tablet Take 1 tablet (150 mg total) by mouth once for 1 dose. 05/17/21   Georgina Quint, MD  metFORMIN (GLUCOPHAGE) 500 MG tablet Take 2 tablets (1,000 mg total) by mouth 2 (two) times daily with a meal. 02/14/21   Sagardia, Eilleen Kempf, MD  Semaglutide (RYBELSUS) 14 MG TABS Take 1 tablet (14 mg total) by mouth daily. 03/06/21   Georgina Quint, MD  tacrolimus (PROTOPIC) 0.1 % ointment Apply to face every day 12/22/20   Glyn Ade, PA-C  timolol (TIMOPTIC) 0.25 % ophthalmic solution  04/23/19   [provider]   triamcinolone cream (KENALOG) 0.1 % Apply to affected area every day for body- NOT FOR FACE, FOLDS or GROIN 12/22/20   Glyn Ade, PA-C    Family History Family History  Problem Relation Age of Onset   Arthritis Mother    Depression Mother    Diabetes Mother    Hyperlipidemia Mother    Arthritis Father    Cancer Maternal Grandmother        breast   Diabetes Paternal Grandmother    Diabetes Paternal Grandfather    Hearing loss Paternal Grandfather    Cancer Maternal Aunt 35       Breast Cancer    Social History Social History   Tobacco Use   Smoking status: Never   Smokeless tobacco: Never  Substance Use Topics   Alcohol use: No   Drug use: No     Allergies   Crestor [rosuvastatin calcium] and Amoxicillin   Review of Systems Review of Systems  Constitutional:  Positive for activity change. Negative for appetite change, fatigue and fever.  HENT:  Positive for congestion (Allergies). Negative for ear pain and sore throat.   Eyes:  Positive for visual disturbance. Negative for photophobia.  Respiratory:  Negative for cough and shortness of breath.   Cardiovascular:  Negative for chest pain.  Gastrointestinal:  Negative for abdominal pain, diarrhea, nausea and vomiting.  Musculoskeletal:  Negative for arthralgias and myalgias.  Neurological:  Positive for dizziness and light-headedness. Negative for seizures, syncope, facial asymmetry, speech difficulty, weakness, numbness and headaches.    Physical Exam Triage Vital Signs ED Triage Vitals  Enc Vitals Group     BP 06/06/21 1122 121/73     Pulse Rate 06/06/21 1122 72     Resp 06/06/21 1122 19     Temp 06/06/21 1122 98 F (36.7 C)     Temp src --      SpO2 06/06/21 1122 100 %     Weight --      Height --      Head Circumference --      Peak Flow --      Pain Score 06/06/21 1120 6     Pain Loc --      Pain Edu? --      Excl. in GC? --    Orthostatic VS for the past 24 hrs:  BP- Lying Pulse- Lying  BP- Sitting Pulse- Sitting BP- Standing at 0 minutes Pulse- Standing at 0 minutes  06/06/21 1334 91/65 81 129/84 80 (!) 151/98 72    Updated Vital Signs BP 121/73   Pulse 72   Temp 98 F (36.7 C)   Resp 19   SpO2 100%   Visual Acuity Right Eye Distance:   Left Eye Distance:   Bilateral Distance:    Right Eye Near:   Left Eye Near:  Bilateral Near:     Physical Exam Vitals reviewed.  Constitutional:      General: She is awake. She is not in acute distress.    Appearance: Normal appearance. She is well-developed. She is not ill-appearing.     Comments: Very pleasant female appears stated age in no acute distress sitting comfortably in exam room  HENT:     Head: Normocephalic and atraumatic. No raccoon eyes, Battle's sign or contusion.     Right Ear: Ear canal and external ear normal. A middle ear effusion is present. No hemotympanum.     Left Ear: Tympanic membrane, ear canal and external ear normal. No hemotympanum.     Nose: Nose normal.     Mouth/Throat:     Tongue: Tongue does not deviate from midline.     Pharynx: Uvula midline. No oropharyngeal exudate or posterior oropharyngeal erythema.  Eyes:     Extraocular Movements: Extraocular movements intact.     Pupils: Pupils are equal, round, and reactive to light.     Left eye: Pupil is round, reactive and not sluggish.     Comments: Opaque cornea right eye.  Cardiovascular:     Rate and Rhythm: Normal rate and regular rhythm.     Heart sounds: Normal heart sounds, S1 normal and S2 normal. No murmur heard. Pulmonary:     Effort: Pulmonary effort is normal.     Breath sounds: Normal breath sounds. No wheezing, rhonchi or rales.     Comments: Clear to auscultation bilaterally Musculoskeletal:     Cervical back: Normal range of motion and neck supple. No spinous process tenderness or muscular tenderness.     Comments: Strength 5/5 bilateral upper and lower extremities  Neurological:     General: No focal deficit  present.     Mental Status: She is alert and oriented to person, place, and time.     Cranial Nerves: Cranial nerves 2-12 are intact.     Motor: Motor function is intact.     Coordination: Coordination is intact.     Gait: Gait is intact.  Psychiatric:        Behavior: Behavior is cooperative.     UC Treatments / Results  Labs (all labs ordered are listed, but only abnormal results are displayed) Labs Reviewed  POCT URINALYSIS DIPSTICK, ED / UC - Abnormal; Notable for the following components:      Result Value   Glucose, UA >=1000 (*)    Hgb urine dipstick MODERATE (*)    All other components within normal limits  CBG MONITORING, ED - Abnormal; Notable for the following components:   Glucose-Capillary 136 (*)    All other components within normal limits  CBC  COMPREHENSIVE METABOLIC PANEL  POC URINE PREG, ED    EKG   Radiology No results found.  Procedures Procedures (including critical care time)  Medications Ordered in UC Medications - No data to display  Initial Impression / Assessment and Plan / UC Course  I have reviewed the triage vital signs and the nursing notes.  Pertinent labs & imaging results that were available during my care of the patient were reviewed by me and considered in my medical decision making (see chart for details).     Vital signs and physical exam reassuring today; no indication for emergent evaluation or imaging.  EKG was obtained that showed normal sinus rhythm with ventricular rate of 73 bpm and without ischemic changes; compared to 07/12/2016 tracing there is T wave inversion in  lead III without additional changes.  Glucose was appropriate at 136.  UA showed glucosuria consistent with SGLT2 inhibitor use.  Urine pregnancy was negative.  CBC and CMP are pending.  Patient was noted to have orthostatic hypertension on exam with 60 point elevation in blood pressure when going from a lying to standing position.  Discussed that this could be  contributing to her symptoms and recommend she follow-up with her PCP and she was encouraged to call to schedule an appointment soon as possible.  Recommended that she rest at home and drink plenty of fluid.  She is to eat small frequent meals and monitor her blood sugar closely.  Discussed that if she has any recurrent or worsening symptoms that she needs to be seen immediately including syncope, presyncope, recurrent dizziness, chest pain, shortness of breath, weakness.  Strict return precautions given to which she expressed understanding.  Work excuse note provided.  Final Clinical Impressions(s) / UC Diagnoses   Final diagnoses:  Dizziness  Lightheadedness     Discharge Instructions      Follow-up with your primary care doctor as soon as possible.  Make sure you are drinking plenty of fluid and eating small frequent meals.  Monitor your blood sugar closely.  You had a fluctuation in your blood pressure when you went from a lying to standing position and I think it is worthwhile to discuss with your primary care provider seeing a cardiologist.  If you have any worsening symptoms including passing out, chest pain, shortness of breath, lightheadedness that is more persistent, vision changes, weakness you need to go to the emergency room as we discussed.     ED Prescriptions   None    PDMP not reviewed this encounter.   Jeani Hawking, PA-C 06/06/21 1404

## 2021-06-06 NOTE — ED Triage Notes (Addendum)
Pt presents with complaints of dizziness and blurred vision. Reports one episode yesterday lasting 15 min. Pt then had another episode today when she was driving and had to pull over, lasting another 15 min. Pt feels light headed right now, denies blurry vision.  ? ?Pt is having allergy symptoms such as nasal drainage and ear pain. Reports history of allergy symptoms but has never had this dizziness before.  ?

## 2021-06-07 ENCOUNTER — Ambulatory Visit: Payer: Managed Care, Other (non HMO) | Admitting: Emergency Medicine

## 2021-06-08 ENCOUNTER — Other Ambulatory Visit (HOSPITAL_COMMUNITY): Payer: Self-pay

## 2021-06-12 ENCOUNTER — Other Ambulatory Visit: Payer: Self-pay

## 2021-06-12 ENCOUNTER — Ambulatory Visit (INDEPENDENT_AMBULATORY_CARE_PROVIDER_SITE_OTHER): Payer: Managed Care, Other (non HMO) | Admitting: Emergency Medicine

## 2021-06-12 ENCOUNTER — Encounter: Payer: Self-pay | Admitting: Emergency Medicine

## 2021-06-12 VITALS — BP 116/72 | HR 90 | Temp 98.5°F | Ht 66.0 in | Wt 262.2 lb

## 2021-06-12 DIAGNOSIS — G9331 Postviral fatigue syndrome: Secondary | ICD-10-CM

## 2021-06-12 DIAGNOSIS — E1165 Type 2 diabetes mellitus with hyperglycemia: Secondary | ICD-10-CM

## 2021-06-12 DIAGNOSIS — R42 Dizziness and giddiness: Secondary | ICD-10-CM | POA: Diagnosis not present

## 2021-06-12 DIAGNOSIS — E1169 Type 2 diabetes mellitus with other specified complication: Secondary | ICD-10-CM

## 2021-06-12 DIAGNOSIS — E785 Hyperlipidemia, unspecified: Secondary | ICD-10-CM

## 2021-06-12 LAB — POCT GLYCOSYLATED HEMOGLOBIN (HGB A1C): Hemoglobin A1C: 7.7 % — AB (ref 4.0–5.6)

## 2021-06-12 NOTE — Assessment & Plan Note (Signed)
Hemoglobin A1c better than before at 7.7.  Still not at goal. ?Diet and nutrition discussed. ?Continue Rybelsus 14 mg daily and Farxiga 10 mg daily. ?Continue metformin 500 mg twice a day. ?Continue Zetia 10 mg daily. ?Follow-up in 3 months. ?

## 2021-06-12 NOTE — Assessment & Plan Note (Signed)
Dizzy spells getting better.  Most likely secondary to recent viral infection. ?Advised to stay well-hydrated and continue present medications. ?Monitor symptoms and follow-up with the office if no better or worse during the next several weeks. ?

## 2021-06-12 NOTE — Patient Instructions (Signed)

## 2021-06-12 NOTE — Progress Notes (Signed)
Maria Maddox ?36 y.o. ? ? ?Chief Complaint  ?Patient presents with  ? Follow-up  ?  Seen for dizziness, left ear pain , drainage   ? ? ?HISTORY OF PRESENT ILLNESS: ?This is a 36 y.o. female complaining of intermittent dizzy spells for about a week along with right ear discomfort.  Found to have effusion.  No antibiotics. ?Had viral gastroenteritis about 3 weeks ago ?Seen at urgent care center on 06/06/2021 ?Unremarkable work-up.  Mild hypokalemia on CMP at 3.3. ?Negative pregnancy test. ?Normal CBC ?Found to be slightly orthostatic ?Glucose in the urine ?Lab Results  ?Component Value Date  ? HGBA1C 8.9 (A) 03/06/2021  ? ? ? ?HPI ? ? ?Prior to Admission medications   ?Medication Sig Start Date End Date Taking? Authorizing Provider  ?AFLURIA QUADRIVALENT 0.5 ML injection  01/19/19  Yes [provider]  ?alclomethasone (ACLOVATE) 0.05 % cream Apply to face every day- DO NOT APPLY TO RIGHT EYE DUE TO GLAUCOMA 12/22/20  Yes Sheffield, [place] R, PA-C  ?atropine 1 % ophthalmic solution as needed.   Yes [provider]  ?brimonidine (ALPHAGAN P) 0.1 % SOLN Place 1 drop into both eyes 2 (two) times daily.   Yes [provider]  ?dapagliflozin propanediol (FARXIGA) 10 MG TABS tablet Take 1 tablet (10 mg total) by mouth daily before breakfast. 03/06/21  Yes Valentine Kuechle, Ines Bloomer, MD  ?dorzolamide-timolol (COSOPT) 22.3-6.8 MG/ML ophthalmic solution Place 1 drop into the right eye daily.   Yes [provider]  ?ezetimibe (ZETIA) 10 MG tablet TAKE 1 TABLET (10 MG TOTAL) BY MOUTH DAILY. 02/14/21  Yes Shafiq Larch, Ines Bloomer, MD  ?fluconazole (DIFLUCAN) 150 MG tablet Take 1 tablet (150 mg total) by mouth once for 1 dose. 05/17/21  Yes Verlaine Embry, Ines Bloomer, MD  ?metFORMIN (GLUCOPHAGE) 500 MG tablet Take 2 tablets (1,000 mg total) by mouth 2 (two) times daily with a meal. 02/14/21  Yes Nakeeta Sebastiani, Ines Bloomer, MD  ?potassium chloride (KLOR-CON) 10 MEQ tablet Take 1 tablet (10 mEq total) by mouth 2 (two)  times daily. 06/06/21  Yes Raspet, Derry Skill, PA-C  ?Semaglutide (RYBELSUS) 14 MG TABS Take 1 tablet (14 mg total) by mouth daily. 03/06/21  Yes Horald Pollen, MD  ?tacrolimus (PROTOPIC) 0.1 % ointment Apply to face every day 12/22/20  Yes Sheffield, [place] R, PA-C  ?timolol (TIMOPTIC) 0.25 % ophthalmic solution  04/23/19  Yes [provider]  ?triamcinolone cream (KENALOG) 0.1 % Apply to affected area every day for body- NOT FOR FACE, FOLDS or GROIN 12/22/20  Yes Sheffield, Ronalee Red, PA-C  ? ? ?Allergies  ?Allergen Reactions  ? Crestor [Rosuvastatin Calcium] Other (See Comments)  ?  Per patient body pain  ? Amoxicillin Rash  ?  bad yeast infection  ? ? ?Patient Active Problem List  ? Diagnosis Date Noted  ? Statin intolerance 02/04/2020  ? Body mass index (BMI) of 39.0-39.9 in adult 02/04/2020  ? Dyslipidemia associated with type 2 diabetes mellitus ([place]) 10/14/2016  ? Morbid obesity ([place]) 01/15/2013  ? Polycystic ovaries 01/15/2013  ? Glaucoma 07/02/2011  ? ? ?Past Medical History:  ?Diagnosis Date  ? Eczema   ? Glaucoma   ? has had for 10 yrears  ? Legally blind in right eye, as defined in [place]   ? PCOS (polycystic ovarian syndrome)   ? ? ?Past Surgical History:  ?Procedure Laterality Date  ? EYE SURGERY    ? right side, numerous surgeries (now blind)  ? PILONIDAL CYST EXCISION N/A  10/26/2015  ? Procedure: CYST EXCISION PILONIDAL;  Surgeon: Coralie Keens, MD;  Location: [place];  Service: General;  Laterality: N/A;  ? ? ?Social History  ? ?Socioeconomic History  ? Marital status: Married  ?  Spouse name: Not on file  ? Number of children: Not on file  ? Years of education: Not on file  ? Highest education level: Not on file  ?Occupational History  ? Not on file  ?Tobacco Use  ? Smoking status: Never  ? Smokeless tobacco: Never  ?Substance and Sexual Activity  ? Alcohol use: No  ? Drug use: No  ? Sexual activity: Yes  ?  Birth control/protection: None  ?Other Topics Concern  ? Not on file   ?Social History Narrative  ? Not on file  ? ?Social Determinants of Health  ? ?Financial Resource Strain: Not on file  ?Food Insecurity: Not on file  ?Transportation Needs: Not on file  ?Physical Activity: Not on file  ?Stress: Not on file  ?Social Connections: Not on file  ?Intimate Partner Violence: Not on file  ? ? ?Family History  ?Problem Relation Age of Onset  ? Arthritis Mother   ? Depression Mother   ? Diabetes Mother   ? Hyperlipidemia Mother   ? Arthritis Father   ? Cancer Maternal Grandmother   ?     breast  ? Diabetes Paternal Grandmother   ? Diabetes Paternal Grandfather   ? Hearing loss Paternal Grandfather   ? Cancer Maternal Aunt 50  ?     Breast Cancer  ? ? ? ?Review of Systems  ?Constitutional: Negative.  Negative for chills and fever.  ?HENT: Negative.  Negative for congestion.   ?Eyes: Negative.   ?     Blind from right eye  ?Respiratory: Negative.  Negative for cough.   ?Cardiovascular: Negative.  Negative for chest pain and palpitations.  ?Gastrointestinal:  Negative for abdominal pain, diarrhea, nausea and vomiting.  ?Genitourinary: Negative.   ?Skin: Negative.  Negative for rash.  ?Neurological:  Negative for dizziness and headaches.  ? ?Today's Vitals  ? 06/12/21 1556  ?BP: 116/72  ?Pulse: 90  ?Temp: 98.5 ?F (36.9 ?C)  ?TempSrc: Oral  ?SpO2: 97%  ?Weight: 262 lb 4 oz (119 kg)  ?Height: '5\' 6"'$  (1.676 m)  ? ?Body mass index is 42.33 kg/m?. ?Wt Readings from Last 3 Encounters:  ?06/12/21 262 lb 4 oz (119 kg)  ?03/06/21 264 lb (119.7 kg)  ?10/31/20 260 lb (117.9 kg)  ? ? ?Physical Exam ?Vitals reviewed.  ?Constitutional:   ?   Appearance: Normal appearance.  ?HENT:  ?   Head: Normocephalic.  ?   Right Ear: Tympanic membrane, ear canal and external ear normal.  ?   Left Ear: Tympanic membrane, ear canal and external ear normal.  ?   Mouth/Throat:  ?   Mouth: Mucous membranes are moist.  ?   Pharynx: Oropharynx is clear.  ?Eyes:  ?   Extraocular Movements: Extraocular movements intact.  ?    Conjunctiva/sclera: Conjunctivae normal.  ?Cardiovascular:  ?   Rate and Rhythm: Normal rate and regular rhythm.  ?   Pulses: Normal pulses.  ?   Heart sounds: Normal heart sounds.  ?Pulmonary:  ?   Effort: Pulmonary effort is normal.  ?   Breath sounds: Normal breath sounds.  ?Abdominal:  ?   Palpations: Abdomen is soft.  ?   Tenderness: There is no abdominal tenderness.  ?Musculoskeletal:  ?   Cervical back:  No tenderness.  ?   Right lower leg: No edema.  ?   Left lower leg: No edema.  ?Lymphadenopathy:  ?   Cervical: No cervical adenopathy.  ?Skin: ?   General: Skin is warm and dry.  ?   Capillary Refill: Capillary refill takes less than 2 seconds.  ?Neurological:  ?   General: No focal deficit present.  ?   Mental Status: She is alert and oriented to person, place, and time.  ?Psychiatric:     ?   Mood and Affect: Mood normal.     ?   Behavior: Behavior normal.  ? ? ?Results for orders placed or performed in visit on 06/12/21 (from the past 24 hour(s))  ?POCT glycosylated hemoglobin (Hb A1C)     Status: Abnormal  ? Collection Time: 06/12/21  4:15 PM  ?Result Value Ref Range  ? Hemoglobin A1C 7.7 (A) 4.0 - 5.6 %  ? HbA1c POC (<> result, manual entry)    ? HbA1c, POC (prediabetic range)    ? HbA1c, POC (controlled diabetic range)    ? ? ?ASSESSMENT & PLAN: ?A total of 47 minutes was spent with the patient and counseling/coordination of care regarding preparing for this visit, review of most recent blood work results including today's hemoglobin A1c, review of most recent urgent care center visit and results, review of most recent office visit note, review of all medications, differential diagnosis of dizzy spells, need to stay well-hydrated, prognosis, documentation and need for follow-up. ? ?Problem List Items Addressed This Visit   ? ?  ? Endocrine  ? Dyslipidemia associated with type 2 diabetes mellitus ([place])  ?  Hemoglobin A1c better than before at 7.7.  Still not at goal. ?Diet and nutrition  discussed. ?Continue Rybelsus 14 mg daily and Farxiga 10 mg daily. ?Continue metformin 500 mg twice a day. ?Continue Zetia 10 mg daily. ?Follow-up in 3 months. ?  ?  ?  ? Other  ? Dizzy spells - Primary  ?  Dizzy spells getti

## 2021-06-13 LAB — COMPREHENSIVE METABOLIC PANEL
ALT: 13 U/L (ref 0–35)
AST: 14 U/L (ref 0–37)
Albumin: 3.8 g/dL (ref 3.5–5.2)
Alkaline Phosphatase: 51 U/L (ref 39–117)
BUN: 9 mg/dL (ref 6–23)
CO2: 22 mEq/L (ref 19–32)
Calcium: 9.3 mg/dL (ref 8.4–10.5)
Chloride: 105 mEq/L (ref 96–112)
Creatinine, Ser: 0.74 mg/dL (ref 0.40–1.20)
GFR: 104.76 mL/min (ref >60.00)
Glucose, Bld: 139 mg/dL — ABNORMAL HIGH (ref 70–99)
Potassium: 3.8 mEq/L (ref 3.5–5.1)
Sodium: 135 mEq/L (ref 135–145)
Total Bilirubin: 0.2 mg/dL (ref 0.2–1.2)
Total Protein: 7.9 g/dL (ref 6.0–8.3)

## 2021-06-13 LAB — CBC WITH DIFFERENTIAL/PLATELET
Basophils Absolute: 0 10*3/uL (ref 0.0–0.1)
Basophils Relative: 0.6 % (ref 0.0–3.0)
Eosinophils Absolute: 0.3 10*3/uL (ref 0.0–0.7)
Eosinophils Relative: 4.3 % (ref 0.0–5.0)
HCT: 37.3 % (ref 36.0–46.0)
Hemoglobin: 12.4 g/dL (ref 12.0–15.0)
Lymphocytes Relative: 42.9 % (ref 12.0–46.0)
Lymphs Abs: 2.8 10*3/uL (ref 0.7–4.0)
MCHC: 33.1 g/dL (ref 30.0–36.0)
MCV: 82.1 fl (ref 78.0–100.0)
Monocytes Absolute: 0.3 10*3/uL (ref 0.1–1.0)
Monocytes Relative: 5.2 % (ref 3.0–12.0)
Neutro Abs: 3.1 10*3/uL (ref 1.4–7.7)
Neutrophils Relative %: 47 % (ref 43.0–77.0)
Platelets: 323 10*3/uL (ref 150.0–400.0)
RBC: 4.54 Mil/uL (ref 3.87–5.11)
RDW: 14.4 % (ref 11.5–15.5)
WBC: 6.5 10*3/uL (ref 4.0–10.5)

## 2021-06-22 ENCOUNTER — Other Ambulatory Visit (HOSPITAL_COMMUNITY): Payer: Self-pay

## 2021-06-22 ENCOUNTER — Telehealth: Payer: Self-pay

## 2021-06-22 ENCOUNTER — Other Ambulatory Visit: Payer: Self-pay | Admitting: Emergency Medicine

## 2021-06-22 ENCOUNTER — Encounter: Payer: Self-pay | Admitting: *Deleted

## 2021-06-22 MED ORDER — OZEMPIC (0.25 OR 0.5 MG/DOSE) 2 MG/1.5ML ~~LOC~~ SOPN
0.5000 mg | PEN_INJECTOR | SUBCUTANEOUS | 7 refills | Status: DC
  Filled 2021-06-22 – 2021-07-07 (×2): qty 1.5, 28d supply, fill #0
  Filled 2021-08-17: qty 1.5, 28d supply, fill #1

## 2021-06-22 NOTE — Telephone Encounter (Signed)
It is okay to try Ozempic but she must stop Rybelsus if she is taking it.  They are both the same medication, one is injectable the other one is a pill.  New prescription for Ozempic sent to her pharmacy of record.  Thanks.

## 2021-06-22 NOTE — Telephone Encounter (Signed)
Pt is calling to see if she can try Ozempic again since she has a new insurance. ? ?Please advise ?

## 2021-07-03 ENCOUNTER — Telehealth: Payer: Self-pay | Admitting: *Deleted

## 2021-07-03 NOTE — Telephone Encounter (Signed)
PA for ozempic approved  ?Key: [id number] ?

## 2021-07-07 ENCOUNTER — Other Ambulatory Visit (HOSPITAL_COMMUNITY): Payer: Self-pay

## 2021-07-10 ENCOUNTER — Other Ambulatory Visit (HOSPITAL_COMMUNITY): Payer: Self-pay

## 2021-07-10 ENCOUNTER — Telehealth: Payer: BC Managed Care – PPO | Admitting: Physician Assistant

## 2021-07-10 DIAGNOSIS — J208 Acute bronchitis due to other specified organisms: Secondary | ICD-10-CM

## 2021-07-10 DIAGNOSIS — J302 Other seasonal allergic rhinitis: Secondary | ICD-10-CM

## 2021-07-10 DIAGNOSIS — B9689 Other specified bacterial agents as the cause of diseases classified elsewhere: Secondary | ICD-10-CM

## 2021-07-10 MED ORDER — BENZONATATE 100 MG PO CAPS
100.0000 mg | ORAL_CAPSULE | Freq: Three times a day (TID) | ORAL | 0 refills | Status: DC | PRN
  Filled 2021-07-10: qty 30, 10d supply, fill #0

## 2021-07-10 MED ORDER — AZITHROMYCIN 250 MG PO TABS
ORAL_TABLET | ORAL | 0 refills | Status: AC
  Filled 2021-07-10: qty 6, 5d supply, fill #0

## 2021-07-10 NOTE — Progress Notes (Signed)
E visit for Allergic Rhinitis ?We are sorry that you are not feeling well.  Here is how we plan to help! ? ?Based on what you have shared with me it looks like you have Allergic Rhinitis.  Rhinitis is when a reaction occurs that causes nasal congestion, runny nose, sneezing, and itching.  Most types of rhinitis are caused by an inflammation and are associated with symptoms in the eyes ears or throat. ?There are several types of rhinitis.  The most common are acute rhinitis, which is usually caused by a viral illness, allergic or seasonal rhinitis, and nonallergic or year-round rhinitis.  Nasal allergies occur certain times of the year.  Allergic rhinitis is caused when allergens in the air trigger the release of histamine in the body.  Histamine causes itching, swelling, and fluid to build up in the fragile linings of the nasal passages, sinuses and eyelids.  An itchy nose and clear discharge are common. ? ?I recommend the following over the counter treatments: ?You should take a daily dose of antihistamine and Xyzal 5 mg take 1 tablet daily ? ?I also would recommend a nasal spray: ?Saline 1 spray into each nostril as needed ? ?You may also benefit from eye drops such as: ?Systane 1-2 driops each eye twice daily as needed ? ?I am also concerned that due to ongoing allergies and effect on immune system, you have developed a secondary bacterial bronchitis. As such I am sending in a prescription cough medication, Tessalon, and an antibiotic, Azithromycin, to take as directed. I also recommend some OTC Mucinex-DM to thin congestion and further help with cough.  ? ?HOME CARE: ? ?You can use an over-the-counter saline nasal spray as needed ?Avoid areas where there is heavy dust, mites, or molds ?Stay indoors on windy days during the pollen season ?Keep windows closed in home, at least in bedroom; use air conditioner. ?Use high-efficiency house air filter ?Keep windows closed in car, turn [facility] on re-circulate ?Avoid playing  out with dog during pollen season ? ?GET HELP RIGHT AWAY IF: ? ?If your symptoms do not improve within 10 days ?You become short of breath ?You develop yellow or green discharge from your nose for over 3 days ?You have coughing fits ? ?MAKE SURE YOU: ? ?Understand these instructions ?Will watch your condition ?Will get help right away if you are not doing well or get worse ? ?Thank you for choosing an e-visit. ?Your e-visit answers were reviewed by a board certified advanced clinical practitioner to complete your personal care plan. Depending upon the condition, your plan could have included both over the counter or prescription medications. ?Please review your pharmacy choice. Be sure that the pharmacy you have chosen is open so that you can pick up your prescription now.  If there is a problem you may message your provider in [place] to have the prescription routed to another pharmacy. ?Your safety is important to [place]. If you have drug allergies check your prescription carefully.  ?For the next 24 hours, you can use MyChart to ask questions about today?s visit, request a non-urgent call back, or ask for a work or school excuse from your e-visit provider. ?You will get an email in the next two days asking about your experience. I hope that your e-visit has been valuable and will speed your recovery. ? ? ? ? ? ? ? ?

## 2021-07-10 NOTE — Progress Notes (Signed)
I have spent 5 minutes in review of e-visit questionnaire, review and updating patient chart, medical decision making and response to patient.   Willmar Stockinger Cody Soleil Mas, PA-C    

## 2021-07-11 ENCOUNTER — Ambulatory Visit: Payer: BC Managed Care – PPO | Admitting: Emergency Medicine

## 2021-07-17 ENCOUNTER — Ambulatory Visit: Payer: Managed Care, Other (non HMO) | Admitting: Emergency Medicine

## 2021-07-19 ENCOUNTER — Ambulatory Visit: Payer: BC Managed Care – PPO | Admitting: Emergency Medicine

## 2021-07-19 ENCOUNTER — Other Ambulatory Visit (HOSPITAL_COMMUNITY): Payer: Self-pay

## 2021-07-19 ENCOUNTER — Encounter: Payer: Self-pay | Admitting: Emergency Medicine

## 2021-07-19 VITALS — BP 120/76 | HR 90 | Temp 98.6°F | Ht 67.0 in | Wt 254.5 lb

## 2021-07-19 DIAGNOSIS — N6313 Unspecified lump in the right breast, lower outer quadrant: Secondary | ICD-10-CM

## 2021-07-19 MED ORDER — FLUCONAZOLE 150 MG PO TABS
150.0000 mg | ORAL_TABLET | Freq: Once | ORAL | 0 refills | Status: AC
  Filled 2021-07-19: qty 1, 1d supply, fill #0

## 2021-07-19 MED ORDER — AZITHROMYCIN 250 MG PO TABS
ORAL_TABLET | ORAL | 0 refills | Status: DC
Start: 2021-07-19 — End: 2022-01-04
  Filled 2021-07-19: qty 6, 5d supply, fill #0

## 2021-07-19 NOTE — Patient Instructions (Signed)

## 2021-07-19 NOTE — Progress Notes (Signed)
Maria Maddox ?36 y.o. ? ? ?Chief Complaint  ?Patient presents with  ? Breast Mass  ?  Lump in rt breast   ? Allergies  ?  Finish abx after sinus infection, still coughing, coughing up mucus   ? ? ?HISTORY OF PRESENT ILLNESS: ?This is a 36 y.o. female complaining of lump in the right breast noticed about 4 days ago.  Slightly tender to touch. ?Telehealth visit on 07/10/2021.  Started on antibiotics for sinus infection.  Just finished. ?No other complaints or medical concerns today. ? ?HPI ? ? ?Prior to Admission medications   ?Medication Sig Start Date End Date Taking? Authorizing Provider  ?AFLURIA QUADRIVALENT 0.5 ML injection  01/19/19  Yes [provider]  ?alclomethasone (ACLOVATE) 0.05 % cream Apply to face every day- DO NOT APPLY TO RIGHT EYE DUE TO GLAUCOMA 12/22/20  Yes Sheffield, [place] R, PA-C  ?atropine 1 % ophthalmic solution as needed.   Yes [provider]  ?azithromycin (ZITHROMAX) 250 MG tablet Take 2 tablets by mouth on day  1 then 1 tab daily for 4 more days 07/19/21  Yes Yamari Ventola, Ines Bloomer, MD  ?benzonatate (TESSALON) 100 MG capsule Take 1 capsule (100 mg total) by mouth 3 (three) times daily as needed for cough. 07/10/21  Yes Brunetta Jeans, PA-C  ?brimonidine (ALPHAGAN P) 0.1 % SOLN Place 1 drop into both eyes 2 (two) times daily.   Yes [provider]  ?dapagliflozin propanediol (FARXIGA) 10 MG TABS tablet Take 1 tablet (10 mg total) by mouth daily before breakfast. 03/06/21  Yes Lennell Shanks, Ines Bloomer, MD  ?dorzolamide-timolol (COSOPT) 22.3-6.8 MG/ML ophthalmic solution Place 1 drop into the right eye daily.   Yes [provider]  ?ezetimibe (ZETIA) 10 MG tablet TAKE 1 TABLET (10 MG TOTAL) BY MOUTH DAILY. 02/14/21  Yes Cheryl Stabenow, Ines Bloomer, MD  ?fluconazole (DIFLUCAN) 150 MG tablet Take 1 tablet (150 mg total) by mouth once for 1 dose. 07/19/21 07/20/21 Yes Conley Pawling, Ines Bloomer, MD  ?metFORMIN (GLUCOPHAGE) 500 MG tablet Take 2 tablets (1,000 mg total) by mouth  2 (two) times daily with a meal. 02/14/21  Yes Wren Pryce, Ines Bloomer, MD  ?potassium chloride (KLOR-CON) 10 MEQ tablet Take 1 tablet (10 mEq total) by mouth 2 (two) times daily. 06/06/21  Yes Raspet, Derry Skill, PA-C  ?Semaglutide,0.25 or 0.'5MG'$ /DOS, (OZEMPIC, 0.25 OR 0.5 MG/DOSE,) 2 MG/1.5ML SOPN Inject 0.5 mg into the skin once a week. 06/22/21  Yes Horald Pollen, MD  ?tacrolimus (PROTOPIC) 0.1 % ointment Apply to face every day 12/22/20  Yes Sheffield, [place] R, PA-C  ?timolol (TIMOPTIC) 0.25 % ophthalmic solution  04/23/19  Yes [provider]  ?triamcinolone cream (KENALOG) 0.1 % Apply to affected area every day for body- NOT FOR FACE, FOLDS or GROIN 12/22/20  Yes Sheffield, Ronalee Red, PA-C  ? ? ?Allergies  ?Allergen Reactions  ? Crestor [Rosuvastatin Calcium] Other (See Comments)  ?  Per patient body pain  ? Amoxicillin Rash  ?  bad yeast infection  ? ? ?Patient Active Problem List  ? Diagnosis Date Noted  ? Dizzy spells 06/12/2021  ? Statin intolerance 02/04/2020  ? Body mass index (BMI) of 39.0-39.9 in adult 02/04/2020  ? Dyslipidemia associated with type 2 diabetes mellitus ([place]) 10/14/2016  ? Morbid obesity ([place]) 01/15/2013  ? Polycystic ovaries 01/15/2013  ? Glaucoma 07/02/2011  ? ? ?Past Medical History:  ?Diagnosis Date  ? Eczema   ? Glaucoma   ? has had for 10 yrears  ?  Legally blind in right eye, as defined in [place]   ? PCOS (polycystic ovarian syndrome)   ? ? ?Past Surgical History:  ?Procedure Laterality Date  ? EYE SURGERY    ? right side, numerous surgeries (now blind)  ? PILONIDAL CYST EXCISION N/A 10/26/2015  ? Procedure: CYST EXCISION PILONIDAL;  Surgeon: Coralie Keens, MD;  Location: [facility];  Service: General;  Laterality: N/A;  ? ? ?Social History  ? ?Socioeconomic History  ? Marital status: Married  ?  Spouse name: Not on file  ? Number of children: Not on file  ? Years of education: Not on file  ? Highest education level: Not on file  ?Occupational History  ? Not on file   ?Tobacco Use  ? Smoking status: Never  ? Smokeless tobacco: Never  ?Substance and Sexual Activity  ? Alcohol use: No  ? Drug use: No  ? Sexual activity: Yes  ?  Birth control/protection: None  ?Other Topics Concern  ? Not on file  ?Social History Narrative  ? Not on file  ? ?Social Determinants of Health  ? ?Financial Resource Strain: Not on file  ?Food Insecurity: Not on file  ?Transportation Needs: Not on file  ?Physical Activity: Not on file  ?Stress: Not on file  ?Social Connections: Not on file  ?Intimate Partner Violence: Not on file  ? ? ?Family History  ?Problem Relation Age of Onset  ? Arthritis Mother   ? Depression Mother   ? Diabetes Mother   ? Hyperlipidemia Mother   ? Arthritis Father   ? Cancer Maternal Grandmother   ?     breast  ? Diabetes Paternal Grandmother   ? Diabetes Paternal Grandfather   ? Hearing loss Paternal Grandfather   ? Cancer Maternal Aunt 50  ?     Breast Cancer  ? ? ? ?Review of Systems  ?Constitutional:  Negative for chills and fever.  ?HENT:  Positive for congestion.   ?Respiratory: Negative.  Negative for cough and shortness of breath.   ?Cardiovascular: Negative.   ?Gastrointestinal:  Negative for abdominal pain, nausea and vomiting.  ?Genitourinary: Negative.   ?Skin: Negative.   ?Neurological:  Negative for dizziness and headaches.  ?Endo/Heme/Allergies:  Positive for environmental allergies.  ?All other systems reviewed and are negative. ?Vitals:  ? 07/19/21 1556  ?BP: 120/76  ?Pulse: 90  ?Temp: 98.6 ?F (37 ?C)  ?SpO2: 96%  ? ? ? ?Physical Exam ?Vitals reviewed. Exam conducted with a chaperone present.  ?Constitutional:   ?   Appearance: Normal appearance. She is obese.  ?HENT:  ?   Head: Normocephalic.  ?Eyes:  ?   Comments: Blind from right eye  ?Cardiovascular:  ?   Rate and Rhythm: Normal rate.  ?Pulmonary:  ?   Effort: Pulmonary effort is normal.  ?Chest:  ?Breasts: ?   Right: Mass (Tender lump to right lower outer quadrant, suspected early abscess) present.  ?   Left:  Normal.  ?Musculoskeletal:     ?   General: Normal range of motion.  ?Skin: ?   General: Skin is warm and dry.  ?   Capillary Refill: Capillary refill takes less than 2 seconds.  ?Neurological:  ?   General: No focal deficit present.  ?   Mental Status: She is alert and oriented to person, place, and time.  ?Psychiatric:     ?   Mood and Affect: Mood normal.     ?   Behavior: Behavior normal.  ? ? ? ?  ASSESSMENT & PLAN: ?Problem List Items Addressed This Visit   ?None ?Visit Diagnoses   ? ? Mass of lower outer quadrant of right breast    -  Primary  ? Suspected abscess  ? Relevant Medications  ? azithromycin (ZITHROMAX) 250 MG tablet  ? fluconazole (DIFLUCAN) 150 MG tablet  ? Other Relevant Orders  ? MM Digital Diagnostic Unilat R  ? ?  ? ?Patient Instructions  ?Health Maintenance, Female ?Adopting a healthy lifestyle and getting preventive care are important in promoting health and wellness. Ask your health care provider about: ?The right schedule for you to have regular tests and exams. ?Things you can do on your own to prevent diseases and keep yourself healthy. ?What should I know about diet, weight, and exercise? ?Eat a healthy diet ? ?Eat a diet that includes plenty of vegetables, fruits, low-fat dairy products, and lean protein. ?Do not eat a lot of foods that are high in solid fats, added sugars, or sodium. ?Maintain a healthy weight ?Body mass index (BMI) is used to identify weight problems. It estimates body fat based on height and weight. Your health care provider can help determine your BMI and help you achieve or maintain a healthy weight. ?Get regular exercise ?Get regular exercise. This is one of the most important things you can do for your health. Most adults should: ?Exercise for at least 150 minutes each week. The exercise should increase your heart rate and make you sweat (moderate-intensity exercise). ?Do strengthening exercises at least twice a week. This is in addition to the moderate-intensity  exercise. ?Spend less time sitting. Even light physical activity can be beneficial. ?Watch cholesterol and blood lipids ?Have your blood tested for lipids and cholesterol at 36 years of age, then h

## 2021-07-20 ENCOUNTER — Other Ambulatory Visit: Payer: Self-pay | Admitting: Emergency Medicine

## 2021-07-20 ENCOUNTER — Telehealth: Payer: Self-pay | Admitting: Emergency Medicine

## 2021-07-20 DIAGNOSIS — N6313 Unspecified lump in the right breast, lower outer quadrant: Secondary | ICD-10-CM

## 2021-07-20 NOTE — Telephone Encounter (Signed)
Pt called in and states mammogram was ordered by MD.  ? ?Wanted to know where referral was sent to. Please call pt once referral has been placed to let her know where.  ?

## 2021-07-20 NOTE — Telephone Encounter (Signed)
Called patient and left message for patient regarding her mammogram order. It was sent to [facility] ?

## 2021-08-01 ENCOUNTER — Ambulatory Visit
Admission: RE | Admit: 2021-08-01 | Discharge: 2021-08-01 | Disposition: A | Discharge status: Home / Self Care | Payer: BC Managed Care – PPO | Source: Ambulatory Visit | Attending: Emergency Medicine | Admitting: Emergency Medicine

## 2021-08-01 DIAGNOSIS — R928 Other abnormal and inconclusive findings on diagnostic imaging of breast: Secondary | ICD-10-CM | POA: Diagnosis not present

## 2021-08-01 DIAGNOSIS — N6313 Unspecified lump in the right breast, lower outer quadrant: Secondary | ICD-10-CM

## 2021-08-01 DIAGNOSIS — N6011 Diffuse cystic mastopathy of right breast: Secondary | ICD-10-CM | POA: Diagnosis not present

## 2021-08-10 ENCOUNTER — Telehealth: Payer: Self-pay | Admitting: Emergency Medicine

## 2021-08-10 ENCOUNTER — Other Ambulatory Visit (HOSPITAL_COMMUNITY): Payer: Self-pay

## 2021-08-10 MED ORDER — FLUCONAZOLE 150 MG PO TABS
150.0000 mg | ORAL_TABLET | ORAL | 1 refills | Status: DC
  Filled 2021-08-10: qty 2, 6d supply, fill #0
  Filled 2021-09-03: qty 2, 6d supply, fill #1

## 2021-08-10 NOTE — Telephone Encounter (Signed)
Ok done erx 

## 2021-08-10 NOTE — Telephone Encounter (Signed)
Pt called in and states she cannot get a fill on her Diflucan as it has been discontinued on med list.   Requesting that another provider call it in- wants to pick up two pills opposed to one.   States she is in desperate need as she has another yeast infection because of diabetes.   [facility] Phone:  [phone]  Fax:  [phone]

## 2021-08-18 ENCOUNTER — Other Ambulatory Visit (HOSPITAL_COMMUNITY): Payer: Self-pay

## 2021-08-18 MED ORDER — OZEMPIC (0.25 OR 0.5 MG/DOSE) 2 MG/3ML ~~LOC~~ SOPN
0.5000 mg | PEN_INJECTOR | SUBCUTANEOUS | 5 refills | Status: DC
  Filled 2021-08-18 – 2021-09-03 (×2): qty 3, 28d supply, fill #0
  Filled 2021-10-02: qty 3, 28d supply, fill #1
  Filled 2021-11-09: qty 3, 28d supply, fill #2

## 2021-08-29 ENCOUNTER — Other Ambulatory Visit (HOSPITAL_COMMUNITY): Payer: Self-pay

## 2021-09-04 ENCOUNTER — Other Ambulatory Visit (HOSPITAL_COMMUNITY): Payer: Self-pay

## 2021-09-05 ENCOUNTER — Other Ambulatory Visit (HOSPITAL_COMMUNITY): Payer: Self-pay

## 2021-10-02 ENCOUNTER — Other Ambulatory Visit (HOSPITAL_COMMUNITY): Payer: Self-pay

## 2021-10-02 ENCOUNTER — Other Ambulatory Visit: Payer: Self-pay | Admitting: Internal Medicine

## 2021-10-02 NOTE — Telephone Encounter (Signed)
Ok to PCP please 

## 2021-10-03 ENCOUNTER — Telehealth: Payer: Self-pay

## 2021-10-03 ENCOUNTER — Other Ambulatory Visit (HOSPITAL_COMMUNITY): Payer: Self-pay

## 2021-10-03 MED ORDER — FLUCONAZOLE 150 MG PO TABS
150.0000 mg | ORAL_TABLET | ORAL | 1 refills | Status: DC
  Filled 2021-10-03: qty 2, 3d supply, fill #0
  Filled 2021-11-08: qty 2, 3d supply, fill #1

## 2021-10-03 NOTE — Telephone Encounter (Signed)
Pt is requesting a refill on: fluconazole (DIFLUCAN) 150 MG tablet  Pharmacy: [facility] 07/19/21 ROV 11/13/21

## 2021-10-03 NOTE — Telephone Encounter (Signed)
Okay to prescribe Diflucan but recurrent yeast infections is most likely due to [place] use so her diabetes needs to be reassessed and medication changed.  Also needs office visit.  Thanks.

## 2021-10-03 NOTE — Telephone Encounter (Signed)
New prescription sent to requested pharmacy. Patient has an upcoming appt on 11/13/2021 with provider

## 2021-11-08 ENCOUNTER — Other Ambulatory Visit (HOSPITAL_COMMUNITY): Payer: Self-pay

## 2021-11-09 ENCOUNTER — Other Ambulatory Visit (HOSPITAL_COMMUNITY): Payer: Self-pay

## 2021-11-10 ENCOUNTER — Other Ambulatory Visit (HOSPITAL_COMMUNITY): Payer: Self-pay

## 2021-11-13 ENCOUNTER — Encounter: Payer: Self-pay | Admitting: Emergency Medicine

## 2021-11-13 ENCOUNTER — Ambulatory Visit: Payer: BC Managed Care – PPO | Admitting: Emergency Medicine

## 2021-11-13 VITALS — BP 110/66 | HR 88 | Temp 98.3°F | Ht 67.0 in | Wt 259.2 lb

## 2021-11-13 DIAGNOSIS — E1169 Type 2 diabetes mellitus with other specified complication: Secondary | ICD-10-CM

## 2021-11-13 DIAGNOSIS — E785 Hyperlipidemia, unspecified: Secondary | ICD-10-CM | POA: Diagnosis not present

## 2021-11-13 DIAGNOSIS — E1165 Type 2 diabetes mellitus with hyperglycemia: Secondary | ICD-10-CM | POA: Diagnosis not present

## 2021-11-13 LAB — POCT GLYCOSYLATED HEMOGLOBIN (HGB A1C): Hemoglobin A1C: 7.3 % — AB (ref 4.0–5.6)

## 2021-11-13 NOTE — Patient Instructions (Signed)
Increase Ozempic dose to 1 mg/week Continue metformin Stop Farxiga Follow-up in 3 months  Diabetes Mellitus and Nutrition, Adult When you have diabetes, or diabetes mellitus, it is very important to have healthy eating habits because your blood sugar (glucose) levels are greatly affected by what you eat and drink. Eating healthy foods in the right amounts, at about the same times every day, can help you: Manage your blood glucose. Lower your risk of heart disease. Improve your blood pressure. Reach or maintain a healthy weight. What can affect my meal plan? Every person with diabetes is different, and each person has different needs for a meal plan. Your health care provider may recommend that you work with a dietitian to make a meal plan that is best for you. Your meal plan may vary depending on factors such as: The calories you need. The medicines you take. Your weight. Your blood glucose, blood pressure, and cholesterol levels. Your activity level. Other health conditions you have, such as heart or kidney disease. How do carbohydrates affect me? Carbohydrates, also called carbs, affect your blood glucose level more than any other type of food. Eating carbs raises the amount of glucose in your blood. It is important to know how many carbs you can safely have in each meal. This is different for every person. Your dietitian can help you calculate how many carbs you should have at each meal and for each snack. How does alcohol affect me? Alcohol can cause a decrease in blood glucose (hypoglycemia), especially if you use insulin or take certain diabetes medicines by mouth. Hypoglycemia can be a life-threatening condition. Symptoms of hypoglycemia, such as sleepiness, dizziness, and confusion, are similar to symptoms of having too much alcohol. Do not drink alcohol if: Your health care provider tells you not to drink. You are pregnant, may be pregnant, or are planning to become pregnant. If  you drink alcohol: Limit how much you have to: 0-1 drink a day for women. 0-2 drinks a day for men. Know how much alcohol is in your drink. In the [place] one drink equals one 12 oz bottle of beer (355 mL), one 5 oz glass of wine (148 mL), or one 1 oz glass of hard liquor (44 mL). Keep yourself hydrated with water, diet soda, or unsweetened iced tea. Keep in mind that regular soda, juice, and other mixers may contain a lot of sugar and must be counted as carbs. What are tips for following this plan?  Reading food labels Start by checking the serving size on the Nutrition Facts label of packaged foods and drinks. The number of calories and the amount of carbs, fats, and other nutrients listed on the label are based on one serving of the item. Many items contain more than one serving per package. Check the total grams (g) of carbs in one serving. Check the number of grams of saturated fats and trans fats in one serving. Choose foods that have a low amount or none of these fats. Check the number of milligrams (mg) of salt (sodium) in one serving. Most people should limit total sodium intake to less than 2,300 mg per day. Always check the nutrition information of foods labeled as "low-fat" or "nonfat." These foods may be higher in added sugar or refined carbs and should be avoided. Talk to your dietitian to identify your daily goals for nutrients listed on the label. Shopping Avoid buying canned, pre-made, or processed foods. These foods tend to be high in fat, sodium, and  added sugar. Shop around the outside edge of the grocery store. This is where you will most often find fresh fruits and vegetables, bulk grains, fresh meats, and fresh dairy products. Cooking Use low-heat cooking methods, such as baking, instead of high-heat cooking methods, such as deep frying. Cook using healthy oils, such as olive, canola, or sunflower oil. Avoid cooking with butter, cream, or high-fat meats. Meal planning Eat  meals and snacks regularly, preferably at the same times every day. Avoid going long periods of time without eating. Eat foods that are high in fiber, such as fresh fruits, vegetables, beans, and whole grains. Eat 4-6 oz (112-168 g) of lean protein each day, such as lean meat, chicken, fish, eggs, or tofu. One ounce (oz) (28 g) of lean protein is equal to: 1 oz (28 g) of meat, chicken, or fish. 1 egg.  cup (62 g) of tofu. Eat some foods each day that contain healthy fats, such as avocado, nuts, seeds, and fish. What foods should I eat? Fruits Berries. Apples. Oranges. Peaches. Apricots. Plums. Grapes. Mangoes. Papayas. Pomegranates. Kiwi. Cherries. Vegetables Leafy greens, including lettuce, spinach, kale, chard, collard greens, mustard greens, and cabbage. Beets. Cauliflower. Broccoli. Carrots. Green beans. Tomatoes. Peppers. Onions. Cucumbers. Brussels sprouts. Grains Whole grains, such as whole-wheat or whole-grain bread, crackers, tortillas, cereal, and pasta. Unsweetened oatmeal. Quinoa. Brown or wild rice. Meats and other proteins Seafood. Poultry without skin. Lean cuts of poultry and beef. Tofu. Nuts. Seeds. Dairy Low-fat or fat-free dairy products such as milk, yogurt, and cheese. The items listed above may not be a complete list of foods and beverages you can eat and drink. Contact a dietitian for more information. What foods should I avoid? Fruits Fruits canned with syrup. Vegetables Canned vegetables. Frozen vegetables with butter or cream sauce. Grains Refined white flour and flour products such as bread, pasta, snack foods, and cereals. Avoid all processed foods. Meats and other proteins Fatty cuts of meat. Poultry with skin. Breaded or fried meats. Processed meat. Avoid saturated fats. Dairy Full-fat yogurt, cheese, or milk. Beverages Sweetened drinks, such as soda or iced tea. The items listed above may not be a complete list of foods and beverages you should avoid.  Contact a dietitian for more information. Questions to ask a health care provider Do I need to meet with a certified diabetes care and education specialist? Do I need to meet with a dietitian? What number can I call if I have questions? When are the best times to check my blood glucose? Where to find more information: American Diabetes Association: diabetes.org Academy of Nutrition and Dietetics: eatright.[employer] of Diabetes and Digestive and Kidney Diseases: [employer] Association of Diabetes Care & Education Specialists: diabeteseducator.org Summary It is important to have healthy eating habits because your blood sugar (glucose) levels are greatly affected by what you eat and drink. It is important to use alcohol carefully. A healthy meal plan will help you manage your blood glucose and lower your risk of heart disease. Your health care provider may recommend that you work with a dietitian to make a meal plan that is best for you. This information is not intended to replace advice given to you by your health care provider. Make sure you discuss any questions you have with your health care provider. Document Revised: 10/07/2019 Document Reviewed: 10/07/2019 Elsevier Patient Education  Letts.

## 2021-11-13 NOTE — Progress Notes (Signed)
Maria Maddox 36 y.o.   Chief Complaint  Patient presents with   Follow-up    3 mnth f/u appt , wants to talk about Wilder Glade and recurrent yeast infections     HISTORY OF PRESENT ILLNESS: This is a 36 y.o. female here for 93-monthfollow-up of diabetes. Presently on Farxiga, Ozempic, metformin. Complaining of recurrent yeast infections. Lab Results  Component Value Date   HGBA1C 7.7 (A) 06/12/2021   Wt Readings from Last 3 Encounters:  11/13/21 259 lb 4 oz (117.6 kg)  07/19/21 254 lb 8 oz (115.4 kg)  06/12/21 262 lb 4 oz (119 kg)     HPI   Prior to Admission medications   Medication Sig Start Date End Date Taking? Authorizing Provider  AFLURIA QUADRIVALENT 0.5 ML injection  01/19/19  Yes [provider]  alclomethasone (ACLOVATE) 0.05 % cream Apply to face every day- DO NOT APPLY TO RIGHT EYE DUE TO GLAUCOMA 12/22/20  Yes Sheffield, Kelli R, PA-C  atropine 1 % ophthalmic solution as needed.   Yes [provider]  azithromycin (ZITHROMAX) 250 MG tablet Take 2 tablets by mouth on day  1 then 1 tab daily for 4 more days 07/19/21  Yes Cristian Grieves, MInes Bloomer MD  benzonatate (TESSALON) 100 MG capsule Take 1 capsule (100 mg total) by mouth 3 (three) times daily as needed for cough. 07/10/21  Yes MBrunetta Jeans PA-C  brimonidine (ALPHAGAN P) 0.1 % SOLN Place 1 drop into both eyes 2 (two) times daily.   Yes [provider]  dapagliflozin propanediol (FARXIGA) 10 MG TABS tablet Take 1 tablet (10 mg total) by mouth daily before breakfast. 03/06/21  Yes Delora Gravatt, MInes Bloomer MD  dorzolamide-timolol (COSOPT) 22.3-6.8 MG/ML ophthalmic solution Place 1 drop into the right eye daily.   Yes [provider]  ezetimibe (ZETIA) 10 MG tablet TAKE 1 TABLET (10 MG TOTAL) BY MOUTH DAILY. 02/14/21  Yes SHorald Pollen MD  fluconazole (DIFLUCAN) 150 MG tablet Take 1 tablet (150 mg total) by mouth every 3 days as needed 10/03/21  Yes Justyce Yeater, MInes Bloomer MD   metFORMIN (GLUCOPHAGE) 500 MG tablet Take 2 tablets (1,000 mg total) by mouth 2 (two) times daily with a meal. 02/14/21  Yes Lyndsee Casa, MInes Bloomer MD  potassium chloride (KLOR-CON) 10 MEQ tablet Take 1 tablet (10 mEq total) by mouth 2 (two) times daily. 06/06/21  Yes Raspet, Erin K, PA-C  Semaglutide,0.25 or 0.'5MG'$ /DOS, (OZEMPIC, 0.25 OR 0.5 MG/DOSE,) 2 MG/1.5ML SOPN Inject 0.5 mg into the skin once a week. 06/22/21  Yes Jerrian Mells, MInes Bloomer MD  Semaglutide,0.25 or 0.'5MG'$ /DOS, (OZEMPIC, 0.25 OR 0.5 MG/DOSE,) 2 MG/3ML SOPN Inject 0.5 mg into the skin once a week. 06/22/21  Yes SHorald Pollen MD  tacrolimus (PROTOPIC) 0.1 % ointment Apply to face every day 12/22/20  Yes Sheffield, KRonalee Red PA-C  timolol (TIMOPTIC) 0.25 % ophthalmic solution  04/23/19  Yes [provider]  triamcinolone cream (KENALOG) 0.1 % Apply to affected area every day for body- NOT FOR FACE, FOLDS or GROIN 12/22/20  Yes Sheffield, KClaytonR, PA-C    Allergies  Allergen Reactions   Crestor [Rosuvastatin Calcium] Other (See Comments)    Per patient body pain   Amoxicillin Rash    bad yeast infection    Patient Active Problem List   Diagnosis Date Noted   Dizzy spells 06/12/2021   Statin intolerance 02/04/2020   Body mass index (BMI) of 39.0-39.9 in adult 02/04/2020   Dyslipidemia associated with  type 2 diabetes mellitus ([place]) 10/14/2016   Morbid obesity ([place]) 01/15/2013   Polycystic ovaries 01/15/2013   Glaucoma 07/02/2011    Past Medical History:  Diagnosis Date   Eczema    Glaucoma    has had for 10 yrears   Legally blind in right eye, as defined in [place]    PCOS (polycystic ovarian syndrome)     Past Surgical History:  Procedure Laterality Date   EYE SURGERY     right side, numerous surgeries (now blind)   PILONIDAL CYST EXCISION N/A 10/26/2015   Procedure: CYST EXCISION PILONIDAL;  Surgeon: Coralie Keens, MD;  Location: [facility];  Service: General;  Laterality: N/A;    Social  History   Socioeconomic History   Marital status: Married    Spouse name: Not on file   Number of children: Not on file   Years of education: Not on file   Highest education level: Not on file  Occupational History   Not on file  Tobacco Use   Smoking status: Never   Smokeless tobacco: Never  Substance and Sexual Activity   Alcohol use: No   Drug use: No   Sexual activity: Yes    Birth control/protection: None  Other Topics Concern   Not on file  Social History Narrative   Not on file   Social Determinants of Health   Financial Resource Strain: Not on file  Food Insecurity: Not on file  Transportation Needs: Not on file  Physical Activity: Not on file  Stress: Not on file  Social Connections: Not on file  Intimate Partner Violence: Not on file    Family History  Problem Relation Age of Onset   Arthritis Mother    Depression Mother    Diabetes Mother    Hyperlipidemia Mother    Arthritis Father    Breast cancer Maternal Aunt    Cancer Maternal Aunt 29       Breast Cancer   Breast cancer Paternal Aunt    Breast cancer Maternal Grandmother    Cancer Maternal Grandmother        breast   Diabetes Paternal Grandmother    Diabetes Paternal Grandfather    Hearing loss Paternal Grandfather      Review of Systems  Constitutional: Negative.  Negative for chills and fever.  HENT: Negative.  Negative for congestion and sore throat.   Respiratory: Negative.  Negative for cough and shortness of breath.   Cardiovascular: Negative.  Negative for chest pain and palpitations.  Gastrointestinal:  Negative for abdominal pain, diarrhea, nausea and vomiting.  Genitourinary: Negative.   Skin: Negative.  Negative for rash.  Neurological: Negative.  Negative for dizziness and headaches.  All other systems reviewed and are negative.  Today's Vitals   11/13/21 1604  BP: 110/66  Pulse: 88  Temp: 98.3 F (36.8 C)  TempSrc: Oral  SpO2: 97%  Weight: 259 lb 4 oz (117.6 kg)   Height: '5\' 7"'$  (1.702 m)   Body mass index is 40.6 kg/m. Wt Readings from Last 3 Encounters:  11/13/21 259 lb 4 oz (117.6 kg)  07/19/21 254 lb 8 oz (115.4 kg)  06/12/21 262 lb 4 oz (119 kg)     Physical Exam Vitals reviewed.  Constitutional:      Appearance: Normal appearance.  HENT:     Head: Normocephalic.  Eyes:     Extraocular Movements: Extraocular movements intact.  Cardiovascular:     Rate and Rhythm: Normal rate.  Pulmonary:  Effort: Pulmonary effort is normal.  Skin:    General: Skin is warm and dry.     Capillary Refill: Capillary refill takes less than 2 seconds.  Neurological:     General: No focal deficit present.     Mental Status: She is alert and oriented to person, place, and time.  Psychiatric:        Mood and Affect: Mood normal.        Behavior: Behavior normal.    Results for orders placed or performed in visit on 11/13/21 (from the past 24 hour(s))  POCT HgB A1C     Status: Abnormal   Collection Time: 11/13/21  4:15 PM  Result Value Ref Range   Hemoglobin A1C 7.3 (A) 4.0 - 5.6 %   HbA1c POC (<> result, manual entry)     HbA1c, POC (prediabetic range)     HbA1c, POC (controlled diabetic range)       ASSESSMENT & PLAN: A total of 45 minutes was spent with the patient and counseling/coordination of care regarding preparing for this visit, review of most recent office visit notes, review of most recent blood work results including interpretation of today's hemoglobin A1c, education on nutrition, review of all medications and changes made, cardiovascular risks associated with uncontrolled diabetes, prognosis, documentation, and need for follow-up.  Problem List Items Addressed This Visit       Endocrine   Dyslipidemia associated with type 2 diabetes mellitus ([place])    Better controlled diabetes with hemoglobin A1c of 7.3 but still not at goal. Wilder Glade creating frequent yeast infections.  Wants to stop it. Will increase Ozempic to 1 mg weekly,  continue metformin 1000 mg twice a day and stop [place]. Cardiovascular risks associated with diabetes and dyslipidemia discussed. Diet and nutrition discussed. Continue Zetia 10 mg daily. Follow-up in 3 months.        Other   Morbid obesity ([place])    Diet and nutrition discussed. Advised to decrease amount of daily carbohydrate intake and daily calories and increase amount of plant based protein in her diet.      Other Visit Diagnoses     Type 2 diabetes mellitus with hyperglycemia, without long-term current use of insulin ([place])    -  Primary   Relevant Orders   POCT HgB A1C (Completed)      Patient Instructions  Increase Ozempic dose to 1 mg/week Continue metformin Stop Farxiga Follow-up in 3 months  Diabetes Mellitus and Nutrition, Adult When you have diabetes, or diabetes mellitus, it is very important to have healthy eating habits because your blood sugar (glucose) levels are greatly affected by what you eat and drink. Eating healthy foods in the right amounts, at about the same times every day, can help you: Manage your blood glucose. Lower your risk of heart disease. Improve your blood pressure. Reach or maintain a healthy weight. What can affect my meal plan? Every person with diabetes is different, and each person has different needs for a meal plan. Your health care provider may recommend that you work with a dietitian to make a meal plan that is best for you. Your meal plan may vary depending on factors such as: The calories you need. The medicines you take. Your weight. Your blood glucose, blood pressure, and cholesterol levels. Your activity level. Other health conditions you have, such as heart or kidney disease. How do carbohydrates affect me? Carbohydrates, also called carbs, affect your blood glucose level more than any other type of food.  Eating carbs raises the amount of glucose in your blood. It is important to know how many carbs you can safely have in  each meal. This is different for every person. Your dietitian can help you calculate how many carbs you should have at each meal and for each snack. How does alcohol affect me? Alcohol can cause a decrease in blood glucose (hypoglycemia), especially if you use insulin or take certain diabetes medicines by mouth. Hypoglycemia can be a life-threatening condition. Symptoms of hypoglycemia, such as sleepiness, dizziness, and confusion, are similar to symptoms of having too much alcohol. Do not drink alcohol if: Your health care provider tells you not to drink. You are pregnant, may be pregnant, or are planning to become pregnant. If you drink alcohol: Limit how much you have to: 0-1 drink a day for women. 0-2 drinks a day for men. Know how much alcohol is in your drink. In the [place] one drink equals one 12 oz bottle of beer (355 mL), one 5 oz glass of wine (148 mL), or one 1 oz glass of hard liquor (44 mL). Keep yourself hydrated with water, diet soda, or unsweetened iced tea. Keep in mind that regular soda, juice, and other mixers may contain a lot of sugar and must be counted as carbs. What are tips for following this plan?  Reading food labels Start by checking the serving size on the Nutrition Facts label of packaged foods and drinks. The number of calories and the amount of carbs, fats, and other nutrients listed on the label are based on one serving of the item. Many items contain more than one serving per package. Check the total grams (g) of carbs in one serving. Check the number of grams of saturated fats and trans fats in one serving. Choose foods that have a low amount or none of these fats. Check the number of milligrams (mg) of salt (sodium) in one serving. Most people should limit total sodium intake to less than 2,300 mg per day. Always check the nutrition information of foods labeled as "low-fat" or "nonfat." These foods may be higher in added sugar or refined carbs and should be  avoided. Talk to your dietitian to identify your daily goals for nutrients listed on the label. Shopping Avoid buying canned, pre-made, or processed foods. These foods tend to be high in fat, sodium, and added sugar. Shop around the outside edge of the grocery store. This is where you will most often find fresh fruits and vegetables, bulk grains, fresh meats, and fresh dairy products. Cooking Use low-heat cooking methods, such as baking, instead of high-heat cooking methods, such as deep frying. Cook using healthy oils, such as olive, canola, or sunflower oil. Avoid cooking with butter, cream, or high-fat meats. Meal planning Eat meals and snacks regularly, preferably at the same times every day. Avoid going long periods of time without eating. Eat foods that are high in fiber, such as fresh fruits, vegetables, beans, and whole grains. Eat 4-6 oz (112-168 g) of lean protein each day, such as lean meat, chicken, fish, eggs, or tofu. One ounce (oz) (28 g) of lean protein is equal to: 1 oz (28 g) of meat, chicken, or fish. 1 egg.  cup (62 g) of tofu. Eat some foods each day that contain healthy fats, such as avocado, nuts, seeds, and fish. What foods should I eat? Fruits Berries. Apples. Oranges. Peaches. Apricots. Plums. Grapes. Mangoes. Papayas. Pomegranates. Kiwi. Cherries. Vegetables Leafy greens, including lettuce, spinach, kale, chard,  collard greens, mustard greens, and cabbage. Beets. Cauliflower. Broccoli. Carrots. Green beans. Tomatoes. Peppers. Onions. Cucumbers. Brussels sprouts. Grains Whole grains, such as whole-wheat or whole-grain bread, crackers, tortillas, cereal, and pasta. Unsweetened oatmeal. Quinoa. Brown or wild rice. Meats and other proteins Seafood. Poultry without skin. Lean cuts of poultry and beef. Tofu. Nuts. Seeds. Dairy Low-fat or fat-free dairy products such as milk, yogurt, and cheese. The items listed above may not be a complete list of foods and beverages  you can eat and drink. Contact a dietitian for more information. What foods should I avoid? Fruits Fruits canned with syrup. Vegetables Canned vegetables. Frozen vegetables with butter or cream sauce. Grains Refined white flour and flour products such as bread, pasta, snack foods, and cereals. Avoid all processed foods. Meats and other proteins Fatty cuts of meat. Poultry with skin. Breaded or fried meats. Processed meat. Avoid saturated fats. Dairy Full-fat yogurt, cheese, or milk. Beverages Sweetened drinks, such as soda or iced tea. The items listed above may not be a complete list of foods and beverages you should avoid. Contact a dietitian for more information. Questions to ask a health care provider Do I need to meet with a certified diabetes care and education specialist? Do I need to meet with a dietitian? What number can I call if I have questions? When are the best times to check my blood glucose? Where to find more information: American Diabetes Association: diabetes.org Academy of Nutrition and Dietetics: eatright.[employer] of Diabetes and Digestive and Kidney Diseases: [employer].is Association of Diabetes Care & Education Specialists: diabeteseducator.org Summary It is important to have healthy eating habits because your blood sugar (glucose) levels are greatly affected by what you eat and drink. It is important to use alcohol carefully. A healthy meal plan will help you manage your blood glucose and lower your risk of heart disease. Your health care provider may recommend that you work with a dietitian to make a meal plan that is best for you. This information is not intended to replace advice given to you by your health care provider. Make sure you discuss any questions you have with your health care provider. Document Revised: 10/07/2019 Document Reviewed: 10/07/2019 Elsevier Patient Education  [place], MD [place]  [facility] at [facility]

## 2021-11-13 NOTE — Assessment & Plan Note (Signed)
Better controlled diabetes with hemoglobin A1c of 7.3 but still not at goal. Wilder Glade creating frequent yeast infections.  Wants to stop it. Will increase Ozempic to 1 mg weekly, continue metformin 1000 mg twice a day and stop [place]. Cardiovascular risks associated with diabetes and dyslipidemia discussed. Diet and nutrition discussed. Continue Zetia 10 mg daily. Follow-up in 3 months.

## 2021-11-13 NOTE — Assessment & Plan Note (Signed)
Diet and nutrition discussed.  Advised to decrease amount of daily carbohydrate intake and daily calories and increase amount of plant-based protein in her diet. 

## 2021-11-22 DIAGNOSIS — F411 Generalized anxiety disorder: Secondary | ICD-10-CM | POA: Diagnosis not present

## 2021-11-30 ENCOUNTER — Telehealth: Payer: Self-pay | Admitting: Emergency Medicine

## 2021-11-30 NOTE — Telephone Encounter (Signed)
Patient needs a new prescription sent in stating that she takes the 1 mg. Of the Ozempic - Please send to [facility]

## 2021-12-01 ENCOUNTER — Other Ambulatory Visit (HOSPITAL_COMMUNITY): Payer: Self-pay

## 2021-12-01 MED ORDER — SEMAGLUTIDE (1 MG/DOSE) 4 MG/3ML ~~LOC~~ SOPN
1.0000 mg | PEN_INJECTOR | SUBCUTANEOUS | 3 refills | Status: DC
  Filled 2021-12-01: qty 3, 28d supply, fill #0
  Filled 2022-01-04: qty 3, 28d supply, fill #1

## 2021-12-01 NOTE — Telephone Encounter (Signed)
Sent in [facility] '1mg'$  per previous office note.  Called patient and left message informing patient that a new rx was sent to her requested pharmacy

## 2021-12-01 NOTE — Telephone Encounter (Signed)
Thanks

## 2021-12-04 DIAGNOSIS — F411 Generalized anxiety disorder: Secondary | ICD-10-CM | POA: Diagnosis not present

## 2021-12-11 DIAGNOSIS — F411 Generalized anxiety disorder: Secondary | ICD-10-CM | POA: Diagnosis not present

## 2021-12-18 DIAGNOSIS — F411 Generalized anxiety disorder: Secondary | ICD-10-CM | POA: Diagnosis not present

## 2021-12-25 DIAGNOSIS — F411 Generalized anxiety disorder: Secondary | ICD-10-CM | POA: Diagnosis not present

## 2022-01-01 DIAGNOSIS — F411 Generalized anxiety disorder: Secondary | ICD-10-CM | POA: Diagnosis not present

## 2022-01-04 ENCOUNTER — Emergency Department (HOSPITAL_COMMUNITY): Payer: BC Managed Care – PPO

## 2022-01-04 ENCOUNTER — Other Ambulatory Visit (HOSPITAL_COMMUNITY): Payer: Self-pay

## 2022-01-04 ENCOUNTER — Encounter (HOSPITAL_COMMUNITY): Payer: Self-pay

## 2022-01-04 ENCOUNTER — Ambulatory Visit (HOSPITAL_COMMUNITY)
Admission: EM | Admit: 2022-01-04 | Discharge: 2022-01-04 | Disposition: A | Discharge status: Home / Self Care | Payer: BC Managed Care – PPO | Source: Home / Self Care | Attending: Family Medicine | Admitting: Family Medicine

## 2022-01-04 ENCOUNTER — Emergency Department (HOSPITAL_COMMUNITY)
Admission: EM | Admit: 2022-01-04 | Discharge: 2022-01-04 | Disposition: A | Discharge status: Home / Self Care | Payer: BC Managed Care – PPO | Attending: Emergency Medicine | Admitting: Emergency Medicine

## 2022-01-04 ENCOUNTER — Other Ambulatory Visit: Payer: Self-pay

## 2022-01-04 DIAGNOSIS — J069 Acute upper respiratory infection, unspecified: Secondary | ICD-10-CM | POA: Insufficient documentation

## 2022-01-04 DIAGNOSIS — R0981 Nasal congestion: Secondary | ICD-10-CM | POA: Insufficient documentation

## 2022-01-04 DIAGNOSIS — Z5321 Procedure and treatment not carried out due to patient leaving prior to being seen by health care provider: Secondary | ICD-10-CM | POA: Insufficient documentation

## 2022-01-04 DIAGNOSIS — Z1152 Encounter for screening for COVID-19: Secondary | ICD-10-CM | POA: Insufficient documentation

## 2022-01-04 DIAGNOSIS — R509 Fever, unspecified: Secondary | ICD-10-CM | POA: Insufficient documentation

## 2022-01-04 DIAGNOSIS — Z20822 Contact with and (suspected) exposure to covid-19: Secondary | ICD-10-CM | POA: Insufficient documentation

## 2022-01-04 DIAGNOSIS — H9209 Otalgia, unspecified ear: Secondary | ICD-10-CM | POA: Insufficient documentation

## 2022-01-04 DIAGNOSIS — R059 Cough, unspecified: Secondary | ICD-10-CM | POA: Insufficient documentation

## 2022-01-04 LAB — RESP PANEL BY RT-PCR (RSV, FLU A&B, COVID)  RVPGX2
Influenza A by PCR: NEGATIVE
Influenza B by PCR: NEGATIVE
Resp Syncytial Virus by PCR: NEGATIVE
SARS Coronavirus 2 by RT PCR: NEGATIVE

## 2022-01-04 LAB — SARS CORONAVIRUS 2 BY RT PCR: SARS Coronavirus 2 by RT PCR: NEGATIVE

## 2022-01-04 MED ORDER — BENZONATATE 100 MG PO CAPS
100.0000 mg | ORAL_CAPSULE | Freq: Three times a day (TID) | ORAL | 0 refills | Status: DC | PRN
  Filled 2022-01-04: qty 21, 7d supply, fill #0

## 2022-01-04 NOTE — ED Notes (Signed)
Verified patient identity with 2 identifiers.  Verified labeling of specimen at bedside.  Specimen placed in lab

## 2022-01-04 NOTE — ED Triage Notes (Addendum)
Sts she has a cold for about a week. Runny nose, ear pain, and congestion. Coughing up brown sputum.

## 2022-01-04 NOTE — ED Triage Notes (Signed)
Pt reports runny nose,ear pain and sinus pressure x 1 week.

## 2022-01-04 NOTE — ED Provider Notes (Signed)
[place]    CSN: [id number] Arrival date & time: 01/04/22  1154      History   Chief Complaint Chief Complaint  Patient presents with   Cough   Nasal Congestion   Ear Pain    HPI Maria Maddox is a 36 y.o. female.    Cough  Here for cough and congestion that began on October 15.  Then yesterday she began having fever and malaise.  She is feeling a little short of breath.  No nausea or vomiting or diarrhea.  He is having a little sinus pressure also.  She does have a history of diabetes and last A1c was in the 7 range  Past Medical History:  Diagnosis Date   Eczema    Glaucoma    has had for 10 yrears   Legally blind in right eye, as defined in [place]    PCOS (polycystic ovarian syndrome)     Patient Active Problem List   Diagnosis Date Noted   Statin intolerance 02/04/2020   Body mass index (BMI) of 39.0-39.9 in adult 02/04/2020   Dyslipidemia associated with type 2 diabetes mellitus ([place]) 10/14/2016   Morbid obesity ([place]) 01/15/2013   Polycystic ovaries 01/15/2013   Glaucoma 07/02/2011    Past Surgical History:  Procedure Laterality Date   EYE SURGERY     right side, numerous surgeries (now blind)   PILONIDAL CYST EXCISION N/A 10/26/2015   Procedure: CYST EXCISION PILONIDAL;  Surgeon: Coralie Keens, MD;  Location: [facility];  Service: General;  Laterality: N/A;    OB History   No obstetric history on file.      Home Medications    Prior to Admission medications   Medication Sig Start Date End Date Taking? Authorizing Provider  benzonatate (TESSALON) 100 MG capsule Take 1 capsule (100 mg total) by mouth 3 (three) times daily as needed for cough. 01/04/22  Yes Barrett Henle, MD  AFLURIA QUADRIVALENT 0.5 ML injection  01/19/19   [provider]  alclomethasone (ACLOVATE) 0.05 % cream Apply to face every day- DO NOT APPLY TO RIGHT EYE DUE TO GLAUCOMA 12/22/20   Sheffield, Vida Roller R, PA-C  atropine 1 % ophthalmic  solution as needed.    [provider]  brimonidine (ALPHAGAN P) 0.1 % SOLN Place 1 drop into both eyes 2 (two) times daily.    [provider]  dorzolamide-timolol (COSOPT) 22.3-6.8 MG/ML ophthalmic solution Place 1 drop into the right eye daily.    [provider]  ezetimibe (ZETIA) 10 MG tablet TAKE 1 TABLET (10 MG TOTAL) BY MOUTH DAILY. 02/14/21   Horald Pollen, MD  fluconazole (DIFLUCAN) 150 MG tablet Take 1 tablet (150 mg total) by mouth every 3 days as needed 10/03/21   Horald Pollen, MD  metFORMIN (GLUCOPHAGE) 500 MG tablet Take 2 tablets (1,000 mg total) by mouth 2 (two) times daily with a meal. 02/14/21   Sagardia, Ines Bloomer, MD  potassium chloride (KLOR-CON) 10 MEQ tablet Take 1 tablet (10 mEq total) by mouth 2 (two) times daily. 06/06/21   Raspet, Erin K, PA-C  Semaglutide, 1 MG/DOSE, 4 MG/3ML SOPN Inject 1 mg into the skin once a week. 12/01/21   Horald Pollen, MD  tacrolimus (PROTOPIC) 0.1 % ointment Apply to face every day 12/22/20   Warren Danes, PA-C  timolol (TIMOPTIC) 0.25 % ophthalmic solution  04/23/19   [provider]  triamcinolone cream (KENALOG) 0.1 % Apply to affected area  every day for body- NOT FOR FACE, FOLDS or GROIN 12/22/20   Warren Danes, PA-C    Family History Family History  Problem Relation Age of Onset   Arthritis Mother    Depression Mother    Diabetes Mother    Hyperlipidemia Mother    Arthritis Father    Breast cancer Maternal Aunt    Cancer Maternal Aunt 100       Breast Cancer   Breast cancer Paternal Aunt    Breast cancer Maternal Grandmother    Cancer Maternal Grandmother        breast   Diabetes Paternal Grandmother    Diabetes Paternal Grandfather    Hearing loss Paternal Grandfather     Social History Social History   Tobacco Use   Smoking status: Never   Smokeless tobacco: Never  Substance Use Topics   Alcohol use: No   Drug use: No     Allergies   Crestor  [rosuvastatin calcium] and Amoxicillin   Review of Systems Review of Systems  Respiratory:  Positive for cough.      Physical Exam Triage Vital Signs ED Triage Vitals [01/04/22 1313]  Enc Vitals Group     BP 121/84     Pulse Rate 88     Resp 18     Temp 98.4 F (36.9 C)     Temp src      SpO2 98 %     Weight      Height      Head Circumference      Peak Flow      Pain Score      Pain Loc      Pain Edu?      Excl. in [place]?    No data found.  Updated Vital Signs BP 121/84 (BP Location: Left Arm)   Pulse 88   Temp 98.4 F (36.9 C)   Resp 18   SpO2 98%   Visual Acuity Right Eye Distance:   Left Eye Distance:   Bilateral Distance:    Right Eye Near:   Left Eye Near:    Bilateral Near:     Physical Exam Vitals reviewed.  Constitutional:      General: She is not in acute distress.    Appearance: She is not toxic-appearing.  HENT:     Right Ear: Tympanic membrane and ear canal normal.     Left Ear: Tympanic membrane and ear canal normal.     Nose: Nose normal.     Mouth/Throat:     Mouth: Mucous membranes are moist.     Pharynx: No oropharyngeal exudate or posterior oropharyngeal erythema.  Eyes:     Conjunctiva/sclera: Conjunctivae normal.     Comments: Right eye cornea is opacified.  Cardiovascular:     Rate and Rhythm: Normal rate and regular rhythm.     Heart sounds: No murmur heard. Pulmonary:     Effort: Pulmonary effort is normal. No respiratory distress.     Breath sounds: No stridor. No wheezing, rhonchi or rales.  Musculoskeletal:     Cervical back: Neck supple.  Lymphadenopathy:     Cervical: No cervical adenopathy.  Skin:    Capillary Refill: Capillary refill takes less than 2 seconds.     Coloration: Skin is not jaundiced or pale.  Neurological:     General: No focal deficit present.     Mental Status: She is alert and oriented to person, place, and time.  Psychiatric:  Behavior: Behavior normal.      UC Treatments /  Results  Labs (all labs ordered are listed, but only abnormal results are displayed) Labs Reviewed  RESP PANEL BY RT-PCR (RSV, FLU A&B, COVID)  [id number]    EKG   Radiology DG Chest 2 View  Result Date: 01/04/2022 CLINICAL DATA:  Cough with congestion and fever EXAM: CHEST - 2 VIEW COMPARISON:  04/14/2020 FINDINGS: Normal heart size and mediastinal contours. Elevation/eventration of the right diaphragm that is stable from a 2018 lateral view. No acute infiltrate or edema. No effusion or pneumothorax. No acute osseous findings. IMPRESSION: No active cardiopulmonary disease. Electronically Signed   By: Jorje Guild M.D.   On: 01/04/2022 07:39    Procedures Procedures (including critical care time)  Medications Ordered in UC Medications - No data to display  Initial Impression / Assessment and Plan / UC Course  I have reviewed the triage vital signs and the nursing notes.  Pertinent labs & imaging results that were available during my care of the patient were reviewed by me and considered in my medical decision making (see chart for details).        She had been to the emergency room this morning and already had testing done before she left for getting seen.  COVID swab there was negative and chest x-ray was clear.  Decided to do a flu and RSV swab.  If she is positive for flu, she is a candidate for Tamiflu as I think I would say her symptoms started when the fever started yesterday.  I am swabbing for RSV since she has a history of diabetes and the pattern of her symptoms could mean that she has had RSV  Tessalon Perles are sent in to treat the symptoms; she wanted to use saline nose spray instead of Flonase. Final Clinical Impressions(s) / UC Diagnoses   Final diagnoses:  Viral URI with cough  Fever, unspecified     Discharge Instructions      Chest x-ray was clear  Take benzonatate 100 mg, 1 tab every 8 hours as needed for cough.  We have swabbed you for flu and  RSV.  Staff will notify you if anything is positive     ED Prescriptions     Medication Sig Dispense Auth. Provider   benzonatate (TESSALON) 100 MG capsule Take 1 capsule (100 mg total) by mouth 3 (three) times daily as needed for cough. 21 capsule Barrett Henle, MD      PDMP not reviewed this encounter.   Barrett Henle, MD 01/04/22 1339

## 2022-01-04 NOTE — ED Provider Triage Note (Signed)
Emergency Medicine Provider Triage Evaluation Note  Maria Maddox , a 36 y.o. female  was evaluated in triage.  Pt complains of cough. Cold sxs x 1wk, progressively worse now cough productive with sob.  No n/v/d  Review of Systems  Positive: As above Negative: As above  Physical Exam  BP (!) 159/69   Pulse (!) 110   Temp 99.9 F (37.7 C) (Oral)   Resp 20   Ht '5\' 7"'$  (1.702 m)   Wt 113.4 kg   SpO2 98%   BMI 39.16 kg/m  Gen:   Awake, no distress   Resp:  Normal effort  MSK:   Moves extremities without difficulty  Other:    Medical Decision Making  Medically screening exam initiated at 7:15 AM.  Appropriate orders placed.  Maria Maddox was informed that the remainder of the evaluation will be completed by another provider, this initial triage assessment does not replace that evaluation, and the importance of remaining in the ED until their evaluation is complete.     Domenic Moras, PA-C 01/04/22 [phone]

## 2022-01-04 NOTE — Discharge Instructions (Addendum)
Chest x-ray was clear  Take benzonatate 100 mg, 1 tab every 8 hours as needed for cough.  We have swabbed you for flu and RSV.  Staff will notify you if anything is positive

## 2022-01-05 ENCOUNTER — Other Ambulatory Visit (HOSPITAL_COMMUNITY): Payer: Self-pay

## 2022-01-08 DIAGNOSIS — F411 Generalized anxiety disorder: Secondary | ICD-10-CM | POA: Diagnosis not present

## 2022-01-15 DIAGNOSIS — F411 Generalized anxiety disorder: Secondary | ICD-10-CM | POA: Diagnosis not present

## 2022-01-16 ENCOUNTER — Other Ambulatory Visit (HOSPITAL_COMMUNITY): Payer: Self-pay

## 2022-01-22 DIAGNOSIS — F411 Generalized anxiety disorder: Secondary | ICD-10-CM | POA: Diagnosis not present

## 2022-01-29 DIAGNOSIS — F411 Generalized anxiety disorder: Secondary | ICD-10-CM | POA: Diagnosis not present

## 2022-02-05 DIAGNOSIS — F411 Generalized anxiety disorder: Secondary | ICD-10-CM | POA: Diagnosis not present

## 2022-02-12 DIAGNOSIS — F411 Generalized anxiety disorder: Secondary | ICD-10-CM | POA: Diagnosis not present

## 2022-02-14 ENCOUNTER — Ambulatory Visit: Payer: BC Managed Care – PPO | Admitting: Emergency Medicine

## 2022-02-19 DIAGNOSIS — F411 Generalized anxiety disorder: Secondary | ICD-10-CM | POA: Diagnosis not present

## 2022-02-26 DIAGNOSIS — F411 Generalized anxiety disorder: Secondary | ICD-10-CM | POA: Diagnosis not present

## 2022-03-01 ENCOUNTER — Other Ambulatory Visit (HOSPITAL_COMMUNITY): Payer: Self-pay

## 2022-03-01 ENCOUNTER — Encounter: Payer: Self-pay | Admitting: Emergency Medicine

## 2022-03-01 ENCOUNTER — Ambulatory Visit: Payer: BC Managed Care – PPO | Admitting: Emergency Medicine

## 2022-03-01 VITALS — BP 120/72 | HR 82 | Temp 98.3°F | Ht 67.0 in | Wt 264.0 lb

## 2022-03-01 DIAGNOSIS — E785 Hyperlipidemia, unspecified: Secondary | ICD-10-CM | POA: Diagnosis not present

## 2022-03-01 DIAGNOSIS — Z789 Other specified health status: Secondary | ICD-10-CM

## 2022-03-01 DIAGNOSIS — E1169 Type 2 diabetes mellitus with other specified complication: Secondary | ICD-10-CM

## 2022-03-01 LAB — MICROALBUMIN / CREATININE URINE RATIO
Creatinine,U: 248.3 mg/dL
Microalb Creat Ratio: 6.2 mg/g (ref 0.0–30.0)
Microalb, Ur: 15.5 mg/dL — ABNORMAL HIGH (ref 0.0–1.9)

## 2022-03-01 LAB — POCT GLYCOSYLATED HEMOGLOBIN (HGB A1C): Hemoglobin A1C: 7.6 % — AB (ref 4.0–5.6)

## 2022-03-01 MED ORDER — SEMAGLUTIDE (2 MG/DOSE) 8 MG/3ML ~~LOC~~ SOPN
2.0000 mg | PEN_INJECTOR | SUBCUTANEOUS | 5 refills | Status: DC
  Filled 2022-03-01: qty 3, 28d supply, fill #0
  Filled 2022-04-05: qty 3, 28d supply, fill #1
  Filled 2022-05-11: qty 3, 28d supply, fill #2
  Filled 2022-07-18 – 2022-10-02 (×2): qty 3, 28d supply, fill #3
  Filled 2022-10-26: qty 3, 28d supply, fill #4
  Filled 2022-11-27: qty 3, 28d supply, fill #5

## 2022-03-01 NOTE — Assessment & Plan Note (Signed)
Hemoglobin A1c a little higher than before at 7.6. Recommend to increase weekly Ozempic to 2 mg and continue metformin 1000 mg twice a day Cardiovascular risks associated with uncontrolled diabetes discussed. Diet and nutrition discussed. Continue Zetia 10 mg daily.  Intolerant to statins. Follow-up in 3 months.

## 2022-03-01 NOTE — Patient Instructions (Signed)

## 2022-03-01 NOTE — Progress Notes (Signed)
Maria Maddox 36 y.o.   Chief Complaint  Patient presents with   Follow-up    Would like be referred to ENT having sinus problems    HISTORY OF PRESENT ILLNESS: This is a 37 y.o. female here for follow-up of diabetes and hypertension. Presently on Ozempic and metformin for diabetes  HPI   Prior to Admission medications   Medication Sig Start Date End Date Taking? Authorizing Provider  AFLURIA QUADRIVALENT 0.5 ML injection  01/19/19  Yes [provider]  alclomethasone (ACLOVATE) 0.05 % cream Apply to face every day- DO NOT APPLY TO RIGHT EYE DUE TO GLAUCOMA 12/22/20  Yes Sheffield, Kelli R, PA-C  atropine 1 % ophthalmic solution as needed.   Yes [provider]  brimonidine (ALPHAGAN P) 0.1 % SOLN Place 1 drop into both eyes 2 (two) times daily.   Yes [provider]  dorzolamide-timolol (COSOPT) 22.3-6.8 MG/ML ophthalmic solution Place 1 drop into the right eye daily.   Yes [provider]  ezetimibe (ZETIA) 10 MG tablet TAKE 1 TABLET (10 MG TOTAL) BY MOUTH DAILY. 02/14/21  Yes Horald Pollen, MD  fluconazole (DIFLUCAN) 150 MG tablet Take 1 tablet (150 mg total) by mouth every 3 days as needed 10/03/21  Yes [place] Yearby, Ines Bloomer, MD  metFORMIN (GLUCOPHAGE) 500 MG tablet Take 2 tablets (1,000 mg total) by mouth 2 (two) times daily with a meal. 02/14/21  Yes Leo Weyandt, Ines Bloomer, MD  Semaglutide, 1 MG/DOSE, 4 MG/3ML SOPN Inject 1 mg into the skin once a week. 12/01/21  Yes Chastity Noland, Ines Bloomer, MD  benzonatate (TESSALON) 100 MG capsule Take 1 capsule (100 mg total) by mouth 3 (three) times daily as needed for cough. Patient not taking: Reported on 03/01/2022 01/04/22   Barrett Henle, MD  potassium chloride (KLOR-CON) 10 MEQ tablet Take 1 tablet (10 mEq total) by mouth 2 (two) times daily. Patient not taking: Reported on 03/01/2022 06/06/21   Raspet, Derry Skill, PA-C  tacrolimus (PROTOPIC) 0.1 % ointment Apply to face every day Patient not  taking: Reported on 03/01/2022 12/22/20   Warren Danes, PA-C  timolol (TIMOPTIC) 0.25 % ophthalmic solution  04/23/19   [provider]  triamcinolone cream (KENALOG) 0.1 % Apply to affected area every day for body- NOT FOR FACE, FOLDS or GROIN Patient not taking: Reported on 03/01/2022 12/22/20   Warren Danes, PA-C    Allergies  Allergen Reactions   Crestor [Rosuvastatin Calcium] Other (See Comments)    Per patient body pain   Amoxicillin Rash    bad yeast infection    Patient Active Problem List   Diagnosis Date Noted   Statin intolerance 02/04/2020   Body mass index (BMI) of 39.0-39.9 in adult 02/04/2020   Dyslipidemia associated with type 2 diabetes mellitus ([place]) 10/14/2016   Morbid obesity ([place]) 01/15/2013   Polycystic ovaries 01/15/2013   Glaucoma 07/02/2011    Past Medical History:  Diagnosis Date   Eczema    Glaucoma    has had for 10 yrears   Legally blind in right eye, as defined in [place]    PCOS (polycystic ovarian syndrome)     Past Surgical History:  Procedure Laterality Date   EYE SURGERY     right side, numerous surgeries (now blind)   PILONIDAL CYST EXCISION N/A 10/26/2015   Procedure: CYST EXCISION PILONIDAL;  Surgeon: Coralie Keens, MD;  Location: [facility];  Service: General;  Laterality: N/A;    Social History  Socioeconomic History   Marital status: Married    Spouse name: Not on file   Number of children: Not on file   Years of education: Not on file   Highest education level: Not on file  Occupational History   Not on file  Tobacco Use   Smoking status: Never   Smokeless tobacco: Never  Substance and Sexual Activity   Alcohol use: No   Drug use: No   Sexual activity: Yes    Birth control/protection: None  Other Topics Concern   Not on file  Social History Narrative   Not on file   Social Determinants of Health   Financial Resource Strain: Not on file  Food Insecurity: Not on file   Transportation Needs: Not on file  Physical Activity: Not on file  Stress: Not on file  Social Connections: Not on file  Intimate Partner Violence: Not on file    Family History  Problem Relation Age of Onset   Arthritis Mother    Depression Mother    Diabetes Mother    Hyperlipidemia Mother    Arthritis Father    Breast cancer Maternal Aunt    Cancer Maternal Aunt 21       Breast Cancer   Breast cancer Paternal Aunt    Breast cancer Maternal Grandmother    Cancer Maternal Grandmother        breast   Diabetes Paternal Grandmother    Diabetes Paternal Grandfather    Hearing loss Paternal Grandfather      Review of Systems  Constitutional: Negative.  Negative for chills and fever.  HENT: Negative.  Negative for congestion and sore throat.   Respiratory: Negative.  Negative for cough and shortness of breath.   Cardiovascular: Negative.  Negative for chest pain and palpitations.  Gastrointestinal:  Negative for abdominal pain, diarrhea, nausea and vomiting.  Genitourinary: Negative.  Negative for dysuria and hematuria.  Skin: Negative.  Negative for rash.  Neurological: Negative.  Negative for dizziness and headaches.  All other systems reviewed and are negative.   Today's Vitals   03/01/22 1540  BP: 120/72  Pulse: 82  Temp: 98.3 F (36.8 C)  TempSrc: Oral  SpO2: 96%  Weight: 264 lb (119.7 kg)  Height: '5\' 7"'$  (1.702 m)   Body mass index is 41.35 kg/m. Wt Readings from Last 3 Encounters:  03/01/22 264 lb (119.7 kg)  01/04/22 250 lb (113.4 kg)  11/13/21 259 lb 4 oz (117.6 kg)    Physical Exam Vitals reviewed.  Constitutional:      Appearance: Normal appearance. She is obese.  HENT:     Head: Normocephalic.     Mouth/Throat:     Mouth: Mucous membranes are moist.     Pharynx: Oropharynx is clear.  Eyes:     Extraocular Movements: Extraocular movements intact.     Comments: Blind from right eye  Cardiovascular:     Rate and Rhythm: Normal rate and  regular rhythm.     Pulses: Normal pulses.     Heart sounds: Normal heart sounds.  Pulmonary:     Effort: Pulmonary effort is normal.     Breath sounds: Normal breath sounds.  Skin:    General: Skin is warm and dry.  Neurological:     Mental Status: She is alert and oriented to person, place, and time.  Psychiatric:        Mood and Affect: Mood normal.        Behavior: Behavior normal.  Results for orders placed or performed in visit on 03/01/22 (from the past 24 hour(s))  POCT HgB A1C     Status: Abnormal   Collection Time: 03/01/22  4:00 PM  Result Value Ref Range   Hemoglobin A1C 7.6 (A) 4.0 - 5.6 %   HbA1c POC (<> result, manual entry)     HbA1c, POC (prediabetic range)     HbA1c, POC (controlled diabetic range)       ASSESSMENT & PLAN: A total of 45 minutes was spent with the patient and counseling/coordination of care regarding preparing for this visit, review of most recent office visit notes, review of most recent blood work results including interpretation of today's hemoglobin A1c, review of multiple chronic medical conditions and their management, review of all medications and changes made, cardiovascular risks associated with uncontrolled diabetes, education on nutrition, prognosis, documentation and need for follow-up 3 months.  Problem List Items Addressed This Visit       Endocrine   Dyslipidemia associated with type 2 diabetes mellitus ([place]) - Primary    Hemoglobin A1c a little higher than before at 7.6. Recommend to increase weekly Ozempic to 2 mg and continue metformin 1000 mg twice a day Cardiovascular risks associated with uncontrolled diabetes discussed. Diet and nutrition discussed. Continue Zetia 10 mg daily.  Intolerant to statins. Follow-up in 3 months.      Relevant Medications   Semaglutide, 2 MG/DOSE, 8 MG/3ML SOPN   Other Relevant Orders   Urine Microalbumin w/creat. ratio   Comprehensive metabolic panel   Lipid panel   POCT glycosylated  hemoglobin (Hb A1C)   POCT HgB A1C (Completed)     Other   Morbid obesity ([facility])   Relevant Medications   Semaglutide, 2 MG/DOSE, 8 MG/3ML SOPN   Statin intolerance   Patient Instructions  Diabetes Mellitus and Nutrition, Adult When you have diabetes, or diabetes mellitus, it is very important to have healthy eating habits because your blood sugar (glucose) levels are greatly affected by what you eat and drink. Eating healthy foods in the right amounts, at about the same times every day, can help you: Manage your blood glucose. Lower your risk of heart disease. Improve your blood pressure. Reach or maintain a healthy weight. What can affect my meal plan? Every person with diabetes is different, and each person has different needs for a meal plan. Your health care provider may recommend that you work with a dietitian to make a meal plan that is best for you. Your meal plan may vary depending on factors such as: The calories you need. The medicines you take. Your weight. Your blood glucose, blood pressure, and cholesterol levels. Your activity level. Other health conditions you have, such as heart or kidney disease. How do carbohydrates affect me? Carbohydrates, also called carbs, affect your blood glucose level more than any other type of food. Eating carbs raises the amount of glucose in your blood. It is important to know how many carbs you can safely have in each meal. This is different for every person. Your dietitian can help you calculate how many carbs you should have at each meal and for each snack. How does alcohol affect me? Alcohol can cause a decrease in blood glucose (hypoglycemia), especially if you use insulin or take certain diabetes medicines by mouth. Hypoglycemia can be a life-threatening condition. Symptoms of hypoglycemia, such as sleepiness, dizziness, and confusion, are similar to symptoms of having too much alcohol. Do not drink alcohol if: Your health care  provider tells you not to drink. You are pregnant, may be pregnant, or are planning to become pregnant. If you drink alcohol: Limit how much you have to: 0-1 drink a day for women. 0-2 drinks a day for men. Know how much alcohol is in your drink. In the [place] one drink equals one 12 oz bottle of beer (355 mL), one 5 oz glass of wine (148 mL), or one 1 oz glass of hard liquor (44 mL). Keep yourself hydrated with water, diet soda, or unsweetened iced tea. Keep in mind that regular soda, juice, and other mixers may contain a lot of sugar and must be counted as carbs. What are tips for following this plan?  Reading food labels Start by checking the serving size on the Nutrition Facts label of packaged foods and drinks. The number of calories and the amount of carbs, fats, and other nutrients listed on the label are based on one serving of the item. Many items contain more than one serving per package. Check the total grams (g) of carbs in one serving. Check the number of grams of saturated fats and trans fats in one serving. Choose foods that have a low amount or none of these fats. Check the number of milligrams (mg) of salt (sodium) in one serving. Most people should limit total sodium intake to less than 2,300 mg per day. Always check the nutrition information of foods labeled as "low-fat" or "nonfat." These foods may be higher in added sugar or refined carbs and should be avoided. Talk to your dietitian to identify your daily goals for nutrients listed on the label. Shopping Avoid buying canned, pre-made, or processed foods. These foods tend to be high in fat, sodium, and added sugar. Shop around the outside edge of the grocery store. This is where you will most often find fresh fruits and vegetables, bulk grains, fresh meats, and fresh dairy products. Cooking Use low-heat cooking methods, such as baking, instead of high-heat cooking methods, such as deep frying. Cook using healthy oils, such  as olive, canola, or sunflower oil. Avoid cooking with butter, cream, or high-fat meats. Meal planning Eat meals and snacks regularly, preferably at the same times every day. Avoid going long periods of time without eating. Eat foods that are high in fiber, such as fresh fruits, vegetables, beans, and whole grains. Eat 4-6 oz (112-168 g) of lean protein each day, such as lean meat, chicken, fish, eggs, or tofu. One ounce (oz) (28 g) of lean protein is equal to: 1 oz (28 g) of meat, chicken, or fish. 1 egg.  cup (62 g) of tofu. Eat some foods each day that contain healthy fats, such as avocado, nuts, seeds, and fish. What foods should I eat? Fruits Berries. Apples. Oranges. Peaches. Apricots. Plums. Grapes. Mangoes. Papayas. Pomegranates. Kiwi. Cherries. Vegetables Leafy greens, including lettuce, spinach, kale, chard, collard greens, mustard greens, and cabbage. Beets. Cauliflower. Broccoli. Carrots. Green beans. Tomatoes. Peppers. Onions. Cucumbers. Brussels sprouts. Grains Whole grains, such as whole-wheat or whole-grain bread, crackers, tortillas, cereal, and pasta. Unsweetened oatmeal. Quinoa. Brown or wild rice. Meats and other proteins Seafood. Poultry without skin. Lean cuts of poultry and beef. Tofu. Nuts. Seeds. Dairy Low-fat or fat-free dairy products such as milk, yogurt, and cheese. The items listed above may not be a complete list of foods and beverages you can eat and drink. Contact a dietitian for more information. What foods should I avoid? Fruits Fruits canned with syrup. Vegetables Canned vegetables. Frozen vegetables with  butter or cream sauce. Grains Refined white flour and flour products such as bread, pasta, snack foods, and cereals. Avoid all processed foods. Meats and other proteins Fatty cuts of meat. Poultry with skin. Breaded or fried meats. Processed meat. Avoid saturated fats. Dairy Full-fat yogurt, cheese, or milk. Beverages Sweetened drinks, such as  soda or iced tea. The items listed above may not be a complete list of foods and beverages you should avoid. Contact a dietitian for more information. Questions to ask a health care provider Do I need to meet with a certified diabetes care and education specialist? Do I need to meet with a dietitian? What number can I call if I have questions? When are the best times to check my blood glucose? Where to find more information: American Diabetes Association: diabetes.org Academy of Nutrition and Dietetics: eatright.[employer] of Diabetes and Digestive and Kidney Diseases: [employer] Association of Diabetes Care & Education Specialists: diabeteseducator.org Summary It is important to have healthy eating habits because your blood sugar (glucose) levels are greatly affected by what you eat and drink. It is important to use alcohol carefully. A healthy meal plan will help you manage your blood glucose and lower your risk of heart disease. Your health care provider may recommend that you work with a dietitian to make a meal plan that is best for you. This information is not intended to replace advice given to you by your health care provider. Make sure you discuss any questions you have with your health care provider. Document Revised: 10/07/2019 Document Reviewed: 10/07/2019 Elsevier Patient Education  Sheffield, MD [facility]

## 2022-03-02 ENCOUNTER — Other Ambulatory Visit (HOSPITAL_COMMUNITY): Payer: Self-pay

## 2022-03-02 ENCOUNTER — Other Ambulatory Visit: Payer: Self-pay | Admitting: Emergency Medicine

## 2022-03-02 DIAGNOSIS — E1165 Type 2 diabetes mellitus with hyperglycemia: Secondary | ICD-10-CM

## 2022-03-02 LAB — COMPREHENSIVE METABOLIC PANEL
ALT: 12 U/L (ref 0–35)
AST: 11 U/L (ref 0–37)
Albumin: 3.9 g/dL (ref 3.5–5.2)
Alkaline Phosphatase: 51 U/L (ref 39–117)
BUN: 8 mg/dL (ref 6–23)
CO2: 24 mEq/L (ref 19–32)
Calcium: 9.3 mg/dL (ref 8.4–10.5)
Chloride: 104 mEq/L (ref 96–112)
Creatinine, Ser: 0.82 mg/dL (ref 0.40–1.20)
GFR: 92.15 mL/min (ref >60.00)
Glucose, Bld: 147 mg/dL — ABNORMAL HIGH (ref 70–99)
Potassium: 3.4 mEq/L — ABNORMAL LOW (ref 3.5–5.1)
Sodium: 138 mEq/L (ref 135–145)
Total Bilirubin: 0.3 mg/dL (ref 0.2–1.2)
Total Protein: 7.9 g/dL (ref 6.0–8.3)

## 2022-03-02 LAB — LIPID PANEL
Cholesterol: 170 mg/dL (ref 0–200)
HDL: 42.1 mg/dL (ref >39.00)
LDL Cholesterol: 102 mg/dL — ABNORMAL HIGH (ref 0–99)
NonHDL: 128.36
Total CHOL/HDL Ratio: 4
Triglycerides: 133 mg/dL (ref 0.0–149.0)
VLDL: 26.6 mg/dL (ref 0.0–40.0)

## 2022-03-02 MED ORDER — METFORMIN HCL 500 MG PO TABS
1000.0000 mg | ORAL_TABLET | Freq: Two times a day (BID) | ORAL | 3 refills | Status: DC
  Filled 2022-03-02 – 2022-04-05 (×2): qty 180, 45d supply, fill #0
  Filled 2022-06-08: qty 180, 45d supply, fill #1

## 2022-03-05 DIAGNOSIS — F411 Generalized anxiety disorder: Secondary | ICD-10-CM | POA: Diagnosis not present

## 2022-03-13 ENCOUNTER — Other Ambulatory Visit (HOSPITAL_COMMUNITY): Payer: Self-pay

## 2022-03-26 DIAGNOSIS — F411 Generalized anxiety disorder: Secondary | ICD-10-CM | POA: Diagnosis not present

## 2022-04-02 DIAGNOSIS — F411 Generalized anxiety disorder: Secondary | ICD-10-CM | POA: Diagnosis not present

## 2022-04-06 ENCOUNTER — Other Ambulatory Visit (HOSPITAL_COMMUNITY): Payer: Self-pay

## 2022-04-09 DIAGNOSIS — F411 Generalized anxiety disorder: Secondary | ICD-10-CM | POA: Diagnosis not present

## 2022-04-16 DIAGNOSIS — F411 Generalized anxiety disorder: Secondary | ICD-10-CM | POA: Diagnosis not present

## 2022-04-23 DIAGNOSIS — F411 Generalized anxiety disorder: Secondary | ICD-10-CM | POA: Diagnosis not present

## 2022-04-30 DIAGNOSIS — F411 Generalized anxiety disorder: Secondary | ICD-10-CM | POA: Diagnosis not present

## 2022-05-31 ENCOUNTER — Encounter: Payer: Self-pay | Admitting: Emergency Medicine

## 2022-05-31 ENCOUNTER — Ambulatory Visit: Payer: BC Managed Care – PPO | Admitting: Emergency Medicine

## 2022-05-31 VITALS — BP 110/70 | HR 82 | Temp 98.3°F | Ht 67.0 in | Wt 255.4 lb

## 2022-05-31 DIAGNOSIS — E1165 Type 2 diabetes mellitus with hyperglycemia: Secondary | ICD-10-CM

## 2022-05-31 DIAGNOSIS — E1169 Type 2 diabetes mellitus with other specified complication: Secondary | ICD-10-CM

## 2022-05-31 DIAGNOSIS — E785 Hyperlipidemia, unspecified: Secondary | ICD-10-CM | POA: Diagnosis not present

## 2022-05-31 LAB — POCT GLYCOSYLATED HEMOGLOBIN (HGB A1C): Hemoglobin A1C: 6.7 % — AB (ref 4.0–5.6)

## 2022-05-31 NOTE — Patient Instructions (Signed)

## 2022-05-31 NOTE — Assessment & Plan Note (Signed)
Much improved diabetes with hemoglobin A1c of 6.7 Continue Ozempic 2 mg weekly and metformin 500 mg twice a day Cardiovascular risks associated with diabetes and dyslipidemia discussed Continue Zetia 10 mg daily Diet and nutrition discussed Follow-up in 3 months

## 2022-05-31 NOTE — Progress Notes (Signed)
Maria Maddox 37 y.o.   Chief Complaint  Patient presents with   Medical Management of Chronic Issues    34mth f/u appt, no concerns     HISTORY OF PRESENT ILLNESS: This is a 37y.o. female here for 32-monthollow-up of diabetes and dyslipidemia Increased dose of Ozempic to 2 mg weekly.  Eating better and exercising more Overall feels better. Has no complaints or any other medical concerns Lab Results  Component Value Date   HGBA1C 7.6 (A) 03/01/2022   Wt Readings from Last 3 Encounters:  05/31/22 255 lb 6 oz (115.8 kg)  03/01/22 264 lb (119.7 kg)  01/04/22 250 lb (113.4 kg)     HPI   Prior to Admission medications   Medication Sig Start Date End Date Taking? Authorizing Provider  alclomethasone (ACLOVATE) 0.05 % cream Apply to face every day- DO NOT APPLY TO RIGHT EYE DUE TO GLAUCOMA 12/22/20  Yes Sheffield, Kelli R, PA-C  atropine 1 % ophthalmic solution as needed.   Yes [provider]  brimonidine (ALPHAGAN P) 0.1 % SOLN Place 1 drop into both eyes 2 (two) times daily.   Yes [provider]  ezetimibe (ZETIA) 10 MG tablet TAKE 1 TABLET (10 MG TOTAL) BY MOUTH DAILY. 02/14/21  Yes Layan Zalenski, MiInes BloomerMD  metFORMIN (GLUCOPHAGE) 500 MG tablet Take 2 tablets (1,000 mg total) by mouth 2 (two) times daily with a meal. 03/02/22  Yes Luisfernando Brightwell, MiInes BloomerMD  Semaglutide, 2 MG/DOSE, 8 MG/3ML SOPN Inject 2 mg as directed once a week. 03/01/22  Yes Traylen Eckels, MiInes BloomerMD  dorzolamide-timolol (COSOPT) 22.3-6.8 MG/ML ophthalmic solution Place 1 drop into the right eye daily.    [provider]  potassium chloride (KLOR-CON) 10 MEQ tablet Take 1 tablet (10 mEq total) by mouth 2 (two) times daily. Patient not taking: Reported on 03/01/2022 06/06/21   Raspet, ErDerry SkillPA-C  tacrolimus (PROTOPIC) 0.1 % ointment Apply to face every day Patient not taking: Reported on 03/01/2022 12/22/20   ShWarren DanesPA-C  timolol (TIMOPTIC) 0.25 % ophthalmic solution   04/23/19   [provider]  triamcinolone cream (KENALOG) 0.1 % Apply to affected area every day for body- NOT FOR FACE, FOLDS or GROIN Patient not taking: Reported on 03/01/2022 12/22/20   ShWarren DanesPA-C    Allergies  Allergen Reactions   Crestor [Rosuvastatin Calcium] Other (See Comments)    Per patient body pain   Amoxicillin Rash    bad yeast infection    Patient Active Problem List   Diagnosis Date Noted   Statin intolerance 02/04/2020   Body mass index (BMI) of 39.0-39.9 in adult 02/04/2020   Dyslipidemia associated with type 2 diabetes mellitus ([facility]/29/[place]   Morbid obesity ([facility]/2014   Polycystic ovaries 01/15/2013   Glaucoma 07/02/2011    Past Medical History:  Diagnosis Date   Eczema    Glaucoma    has had for 10 yrears   Legally blind in right eye, as defined [place]  PCOS (polycystic ovarian syndrome)     Past Surgical History:  Procedure Laterality Date   EYE SURGERY     right side, numerous surgeries (now blind)   PILONIDAL CYST EXCISION N/A 10/26/2015   Procedure: CYST EXCISION PILONIDAL;  Surgeon: DoCoralie KeensMD;  [facility];  Laterality: N/A;    Social History   Socioeconomic History   Marital status: Married    Spouse name:  Not on file   Number of children: Not on file   Years of education: Not on file   Highest education level: Not on file  Occupational History   Not on file  Tobacco Use   Smoking status: Never   Smokeless tobacco: Never  Substance and Sexual Activity   Alcohol use: No   Drug use: No   Sexual activity: Yes    Birth control/protection: None  Other Topics Concern   Not on file  Social History Narrative   Not on file   Social Determinants of Health   Financial Resource Strain: Not on file  Food Insecurity: Not on file  Transportation Needs: Not on file  Physical Activity: Not on file  Stress: Not on file  Social Connections: Not on file   Intimate Partner Violence: Not on file    Family History  Problem Relation Age of Onset   Arthritis Mother    Depression Mother    Diabetes Mother    Hyperlipidemia Mother    Arthritis Father    Breast cancer Maternal Aunt    Cancer Maternal Aunt 61       Breast Cancer   Breast cancer Paternal Aunt    Breast cancer Maternal Grandmother    Cancer Maternal Grandmother        breast   Diabetes Paternal Grandmother    Diabetes Paternal Grandfather    Hearing loss Paternal Grandfather      Review of Systems  Constitutional: Negative.  Negative for chills and fever.  HENT: Negative.  Negative for congestion and sore throat.   Respiratory: Negative.  Negative for cough and shortness of breath.   Cardiovascular: Negative.  Negative for chest pain and palpitations.  Gastrointestinal:  Negative for nausea and vomiting.  Skin: Negative.  Negative for rash.  All other systems reviewed and are negative.   Vitals:   05/31/22 1535  BP: 110/70  Pulse: 82  Temp: 98.3 F (36.8 C)  SpO2: 99%    Physical Exam Vitals reviewed.  Constitutional:      Appearance: Normal appearance.  HENT:     Head: Normocephalic.  Eyes:     Extraocular Movements: Extraocular movements intact.  Cardiovascular:     Rate and Rhythm: Normal rate and regular rhythm.     Pulses: Normal pulses.     Heart sounds: Normal heart sounds.  Pulmonary:     Effort: Pulmonary effort is normal.     Breath sounds: Normal breath sounds.  Skin:    General: Skin is warm and dry.  Neurological:     Mental Status: She is alert and oriented to person, place, and time.  Psychiatric:        Mood and Affect: Mood normal.        Behavior: Behavior normal.      ASSESSMENT & PLAN: A total of 45 minutes was spent with the patient and counseling/coordination of care regarding preparing for this visit, review of most recent office visit note, review of most recent blood work results including interpretation of today's  hemoglobin A1c, education on nutrition, cardiovascular risk associated with diabetes and dyslipidemia, prognosis, documentation, and need for follow-up.  Problem List Items Addressed This Visit       Endocrine   Dyslipidemia associated with type 2 diabetes mellitus ([facility])    Much improved diabetes with hemoglobin A1c of 6.7 Continue Ozempic 2 mg weekly and metformin 500 mg twice a day Cardiovascular risks associated with diabetes and dyslipidemia discussed Continue Zetia  10 mg daily Diet and nutrition discussed Follow-up in 3 months      Other Visit Diagnoses     Type 2 diabetes mellitus with hyperglycemia, without long-term current use of insulin (HCC)    -  Primary   Relevant Orders   POCT glycosylated hemoglobin (Hb A1C) (Completed)      Patient Instructions  Diabetes Mellitus and Nutrition, Adult When you have diabetes, or diabetes mellitus, it is very important to have healthy eating habits because your blood sugar (glucose) levels are greatly affected by what you eat and drink. Eating healthy foods in the right amounts, at about the same times every day, can help you: Manage your blood glucose. Lower your risk of heart disease. Improve your blood pressure. Reach or maintain a healthy weight. What can affect my meal plan? Every person with diabetes is different, and each person has different needs for a meal plan. Your health care provider may recommend that you work with a dietitian to make a meal plan that is best for you. Your meal plan may vary depending on factors such as: The calories you need. The medicines you take. Your weight. Your blood glucose, blood pressure, and cholesterol levels. Your activity level. Other health conditions you have, such as heart or kidney disease. How do carbohydrates affect me? Carbohydrates, also called carbs, affect your blood glucose level more than any other type of food. Eating carbs raises the amount of glucose in your blood. It  is important to know how many carbs you can safely have in each meal. This is different for every person. Your dietitian can help you calculate how many carbs you should have at each meal and for each snack. How does alcohol affect me? Alcohol can cause a decrease in blood glucose (hypoglycemia), especially if you use insulin or take certain diabetes medicines by mouth. Hypoglycemia can be a life-threatening condition. Symptoms of hypoglycemia, such as sleepiness, dizziness, and confusion, are similar to symptoms of having too much alcohol. Do not drink alcohol if: Your health care provider tells you not to drink. You are pregnant, may be pregnant, or are planning to become pregnant. If you drink alcohol: Limit how much you have to: 0-1 drink a day for women. 0-2 drinks a day for men. Know how much alcohol is in your drink. In the [place] one drink equals one 12 oz bottle of beer (355 mL), one 5 oz glass of wine (148 mL), or one 1 oz glass of hard liquor (44 mL). Keep yourself hydrated with water, diet soda, or unsweetened iced tea. Keep in mind that regular soda, juice, and other mixers may contain a lot of sugar and must be counted as carbs. What are tips for following this plan?  Reading food labels Start by checking the serving size on the Nutrition Facts label of packaged foods and drinks. The number of calories and the amount of carbs, fats, and other nutrients listed on the label are based on one serving of the item. Many items contain more than one serving per package. Check the total grams (g) of carbs in one serving. Check the number of grams of saturated fats and trans fats in one serving. Choose foods that have a low amount or none of these fats. Check the number of milligrams (mg) of salt (sodium) in one serving. Most people should limit total sodium intake to less than 2,300 mg per day. Always check the nutrition information of foods labeled as "low-fat" or "nonfat." These  foods may  be higher in added sugar or refined carbs and should be avoided. Talk to your dietitian to identify your daily goals for nutrients listed on the label. Shopping Avoid buying canned, pre-made, or processed foods. These foods tend to be high in fat, sodium, and added sugar. Shop around the outside edge of the grocery store. This is where you will most often find fresh fruits and vegetables, bulk grains, fresh meats, and fresh dairy products. Cooking Use low-heat cooking methods, such as baking, instead of high-heat cooking methods, such as deep frying. Cook using healthy oils, such as olive, canola, or sunflower oil. Avoid cooking with butter, cream, or high-fat meats. Meal planning Eat meals and snacks regularly, preferably at the same times every day. Avoid going long periods of time without eating. Eat foods that are high in fiber, such as fresh fruits, vegetables, beans, and whole grains. Eat 4-6 oz (112-168 g) of lean protein each day, such as lean meat, chicken, fish, eggs, or tofu. One ounce (oz) (28 g) of lean protein is equal to: 1 oz (28 g) of meat, chicken, or fish. 1 egg.  cup (62 g) of tofu. Eat some foods each day that contain healthy fats, such as avocado, nuts, seeds, and fish. What foods should I eat? Fruits Berries. Apples. Oranges. Peaches. Apricots. Plums. Grapes. Mangoes. Papayas. Pomegranates. Kiwi. Cherries. Vegetables Leafy greens, including lettuce, spinach, kale, chard, collard greens, mustard greens, and cabbage. Beets. Cauliflower. Broccoli. Carrots. Green beans. Tomatoes. Peppers. Onions. Cucumbers. Brussels sprouts. Grains Whole grains, such as whole-wheat or whole-grain bread, crackers, tortillas, cereal, and pasta. Unsweetened oatmeal. Quinoa. Brown or wild rice. Meats and other proteins Seafood. Poultry without skin. Lean cuts of poultry and beef. Tofu. Nuts. Seeds. Dairy Low-fat or fat-free dairy products such as milk, yogurt, and cheese. The items listed  above may not be a complete list of foods and beverages you can eat and drink. Contact a dietitian for more information. What foods should I avoid? Fruits Fruits canned with syrup. Vegetables Canned vegetables. Frozen vegetables with butter or cream sauce. Grains Refined white flour and flour products such as bread, pasta, snack foods, and cereals. Avoid all processed foods. Meats and other proteins Fatty cuts of meat. Poultry with skin. Breaded or fried meats. Processed meat. Avoid saturated fats. Dairy Full-fat yogurt, cheese, or milk. Beverages Sweetened drinks, such as soda or iced tea. The items listed above may not be a complete list of foods and beverages you should avoid. Contact a dietitian for more information. Questions to ask a health care provider Do I need to meet with a certified diabetes care and education specialist? Do I need to meet with a dietitian? What number can I call if I have questions? When are the best times to check my blood glucose? Where to find more information: American Diabetes Association: diabetes.org Academy of Nutrition and Dietetics: eatright.[employer] of Diabetes and Digestive and Kidney Diseases: AmenCredit.is Association of Diabetes Care & Education Specialists: diabeteseducator.org Summary It is important to have healthy eating habits because your blood sugar (glucose) levels are greatly affected by what you eat and drink. It is important to use alcohol carefully. A healthy meal plan will help you manage your blood glucose and lower your risk of heart disease. Your health care provider may recommend that you work with a dietitian to make a meal plan that is best for you. This information is not intended to replace advice given to you by your health care provider.  Make sure you discuss any questions you have with your health care provider. Document Revised: 10/07/2019 Document Reviewed: 10/07/2019 Elsevier Patient Education  [place], MD [facility]

## 2022-06-08 ENCOUNTER — Other Ambulatory Visit (HOSPITAL_COMMUNITY): Payer: Self-pay

## 2022-06-28 ENCOUNTER — Telehealth: Payer: Self-pay | Admitting: Pharmacy Technician

## 2022-06-28 NOTE — Telephone Encounter (Signed)
Patient Advocate Encounter   Received notification that prior authorization for Ozempic (0.25 or 0.5 MG/DOSE) 2MG /3ML pen-injectors is required.   PA submitted on 06/28/2022 Key [id number] [employer] [place] Commercial Electronic Request Form Status is pending

## 2022-07-09 ENCOUNTER — Other Ambulatory Visit (HOSPITAL_COMMUNITY): Payer: Self-pay

## 2022-07-09 ENCOUNTER — Encounter (HOSPITAL_COMMUNITY): Payer: Self-pay

## 2022-07-09 ENCOUNTER — Ambulatory Visit (INDEPENDENT_AMBULATORY_CARE_PROVIDER_SITE_OTHER): Payer: BC Managed Care – PPO

## 2022-07-09 ENCOUNTER — Ambulatory Visit (HOSPITAL_COMMUNITY): Payer: BC Managed Care – PPO

## 2022-07-09 ENCOUNTER — Ambulatory Visit (HOSPITAL_COMMUNITY)
Admission: EM | Admit: 2022-07-09 | Discharge: 2022-07-09 | Disposition: A | Discharge status: Home / Self Care | Payer: BC Managed Care – PPO | Attending: Internal Medicine | Admitting: Internal Medicine

## 2022-07-09 DIAGNOSIS — Z23 Encounter for immunization: Secondary | ICD-10-CM | POA: Diagnosis not present

## 2022-07-09 DIAGNOSIS — S61451A Open bite of right hand, initial encounter: Secondary | ICD-10-CM | POA: Diagnosis not present

## 2022-07-09 DIAGNOSIS — W540XXA Bitten by dog, initial encounter: Secondary | ICD-10-CM

## 2022-07-09 HISTORY — DX: Type 2 diabetes mellitus without complications: E11.9

## 2022-07-09 MED ORDER — FLUCONAZOLE 150 MG PO TABS
150.0000 mg | ORAL_TABLET | ORAL | 0 refills | Status: AC
  Filled 2022-07-09: qty 2, 14d supply, fill #0

## 2022-07-09 MED ORDER — TETANUS-DIPHTH-ACELL PERTUSSIS 5-2.5-18.5 LF-MCG/0.5 IM SUSY
0.5000 mL | PREFILLED_SYRINGE | Freq: Once | INTRAMUSCULAR | Status: AC
  Administered 2022-07-09: 0.5 mL via INTRAMUSCULAR

## 2022-07-09 MED ORDER — CEFUROXIME AXETIL 500 MG PO TABS
500.0000 mg | ORAL_TABLET | Freq: Two times a day (BID) | ORAL | 0 refills | Status: AC
  Filled 2022-07-09: qty 14, 7d supply, fill #0

## 2022-07-09 MED ORDER — TETANUS-DIPHTH-ACELL PERTUSSIS 5-2.5-18.5 LF-MCG/0.5 IM SUSY
PREFILLED_SYRINGE | INTRAMUSCULAR | Status: AC
  Filled 2022-07-09: qty 0.5

## 2022-07-09 MED ORDER — METRONIDAZOLE 500 MG PO TABS
500.0000 mg | ORAL_TABLET | Freq: Three times a day (TID) | ORAL | 0 refills | Status: AC
  Filled 2022-07-09: qty 21, 7d supply, fill #0

## 2022-07-09 NOTE — Discharge Instructions (Signed)
Start taking cefuroxime antibiotic twice daily and Flagyl antibiotic 3 times daily to prophylactically treat for infection to your dog bite wound. Take these medicines with food to avoid stomach upset. Do not drink any alcohol during your course of antibiotics and for at least 72 hours after taking Flagyl antibiotic due to significant potential side effects/interaction with alcohol.  Take Diflucan antifungal pill once today, then again in 7 days to treat for vaginal yeast infection associated with antibiotic use.  Monitor the sites for further signs of infection such as redness surrounding the site, pus drainage, or severe pain.  You may take ibuprofen 600 mg every 6 hours as needed for pain and swelling to the right hand.  Your x-ray is negative for signs of bony abnormality.  We updated your tetanus shot in the clinic today.  Schedule an appointment with your primary care provider for ongoing evaluation and follow-up if your symptoms fail to improve in the next 2 to 3 days or if you develop any new or worsening symptoms.  You may also return to urgent care.  If your symptoms are severe, please go to the nearest emergency room for further evaluation. I hope you feel better!

## 2022-07-09 NOTE — ED Triage Notes (Signed)
Patient reports that her own black lab bit her right han/palm this AM. Dog is vaccinated.

## 2022-07-09 NOTE — ED Provider Notes (Signed)
[facility]    CSN: [id number] Arrival date & time: 07/09/22  0801      History   Chief Complaint Chief Complaint  Patient presents with  . Animal Bite    HPI Maria Maddox is a 37 y.o. female.   Patient presents to urgent care for evaluation of dog bite to the right hand/snuffbox region that happened this morning.  Patient states her 37 year old black lab bit her this morning out of the blue.  She states her lab is elderly and starting to decline mentally/physically.  Patient's pet is fully vaccinated against rabies.  Patient is a diabetic and denies recent antibiotic or steroid use.  No numbness or tingling distally to dog bite.  Full range of motion of the right hand.  Wound bled initially, however bleeding controlled with pressure.  Currently nonbleeding.  Does not take blood thinners.  Unsure of date of last tetanus injection.  Allergic to penicillins.  Pain associated with dog bite is worse with movement of the right hand.  Has not taken any over-the-counter medications to help with symptoms before coming to urgent care.    Past Medical History:  Diagnosis Date  . Diabetes mellitus without complication   . Eczema   . Glaucoma    has had for 10 yrears  . Legally blind in right eye, as defined in [place]   . PCOS (polycystic ovarian syndrome)     Patient Active Problem List   Diagnosis Date Noted  . Statin intolerance 02/04/2020  . Body mass index (BMI) of 39.0-39.9 in adult 02/04/2020  . Dyslipidemia associated with type 2 diabetes mellitus 10/14/2016  . Morbid obesity 01/15/2013  . Polycystic ovaries 01/15/2013  . Glaucoma 07/02/2011    Past Surgical History:  Procedure Laterality Date  . EYE SURGERY     right side, numerous surgeries (now blind)  . PILONIDAL CYST EXCISION N/A 10/26/2015   Procedure: CYST EXCISION PILONIDAL;  Surgeon: Abigail Miyamoto, MD;  Location: [facility];  Service: General;  Laterality: N/A;    OB History   No  obstetric history on file.      Home Medications    Prior to Admission medications   Medication Sig Start Date End Date Taking? Authorizing Provider  cefUROXime (CEFTIN) 500 MG tablet Take 1 tablet (500 mg total) by mouth 2 (two) times daily with a meal for 7 days. 07/09/22 07/16/22 Yes [place] Frieden, Donavan Burnet, FNP  metroNIDAZOLE (FLAGYL) 500 MG tablet Take 1 tablet (500 mg total) by mouth 3 (three) times daily for 7 days. 07/09/22 07/16/22 Yes StanhopeDonavan Burnet, FNP  alclomethasone (ACLOVATE) 0.05 % cream Apply to face every day- DO NOT APPLY TO RIGHT EYE DUE TO GLAUCOMA 12/22/20   Sheffield, Harvin Hazel R, PA-C  atropine 1 % ophthalmic solution as needed.    [provider]  brimonidine (ALPHAGAN P) 0.1 % SOLN Place 1 drop into both eyes 2 (two) times daily.    [provider]  dorzolamide-timolol (COSOPT) 22.3-6.8 MG/ML ophthalmic solution Place 1 drop into the right eye daily.    [provider]  ezetimibe (ZETIA) 10 MG tablet TAKE 1 TABLET (10 MG TOTAL) BY MOUTH DAILY. 02/14/21   Georgina Quint, MD  metFORMIN (GLUCOPHAGE) 500 MG tablet Take 2 tablets (1,000 mg total) by mouth 2 (two) times daily with a meal. 03/02/22   Sagardia, Eilleen Kempf, MD  potassium chloride (KLOR-CON) 10 MEQ tablet Take 1 tablet (10 mEq total) by mouth 2 (two)  times daily. Patient not taking: Reported on 03/01/2022 06/06/21   Raspet, Noberto Retort, PA-C  Semaglutide, 2 MG/DOSE, 8 MG/3ML SOPN Inject 2 mg as directed once a week. 03/01/22   Georgina Quint, MD  tacrolimus (PROTOPIC) 0.1 % ointment Apply to face every day Patient not taking: Reported on 03/01/2022 12/22/20   Glyn Ade, PA-C  timolol (TIMOPTIC) 0.25 % ophthalmic solution  04/23/19   [provider]  triamcinolone cream (KENALOG) 0.1 % Apply to affected area every day for body- NOT FOR FACE, FOLDS or GROIN Patient not taking: Reported on 03/01/2022 12/22/20   Glyn Ade, PA-C    Family History Family  History  Problem Relation Age of Onset  . Arthritis Mother   . Depression Mother   . Diabetes Mother   . Hyperlipidemia Mother   . Arthritis Father   . Breast cancer Maternal Aunt   . Cancer Maternal Aunt 50       Breast Cancer  . Breast cancer Paternal Aunt   . Breast cancer Maternal Grandmother   . Cancer Maternal Grandmother        breast  . Diabetes Paternal Grandmother   . Diabetes Paternal Grandfather   . Hearing loss Paternal Grandfather     Social History Social History   Tobacco Use  . Smoking status: Never  . Smokeless tobacco: Never  Vaping Use  . Vaping Use: Never used  Substance Use Topics  . Alcohol use: No  . Drug use: No     Allergies   Crestor [rosuvastatin calcium] and Amoxicillin   Review of Systems Review of Systems Per HPI  Physical Exam Triage Vital Signs ED Triage Vitals  Enc Vitals Group     BP 07/09/22 0816 121/76     Pulse Rate 07/09/22 0816 83     Resp 07/09/22 0816 16     Temp 07/09/22 0816 98.9 F (37.2 C)     Temp Source 07/09/22 0816 Oral     SpO2 07/09/22 0816 99 %     Weight --      Height --      Head Circumference --      Peak Flow --      Pain Score 07/09/22 0818 9     Pain Loc --      Pain Edu? --      Excl. in GC? --    No data found.  Updated Vital Signs BP 121/76 (BP Location: Left Arm)   Pulse 83   Temp 98.9 F (37.2 C) (Oral)   Resp 16   LMP 06/16/2022   SpO2 99%   Visual Acuity Right Eye Distance:   Left Eye Distance:   Bilateral Distance:    Right Eye Near:   Left Eye Near:    Bilateral Near:     Physical Exam Vitals and nursing note reviewed.  Constitutional:      Appearance: She is not ill-appearing or toxic-appearing.  HENT:     Head: Normocephalic and atraumatic.     Right Ear: Hearing and external ear normal.     Left Ear: Hearing and external ear normal.     Nose: Nose normal.     Mouth/Throat:     Lips: Pink.  Eyes:     General: Lids are normal. Vision grossly intact. Gaze  aligned appropriately.     Extraocular Movements: Extraocular movements intact.     Conjunctiva/sclera: Conjunctivae normal.  Pulmonary:     Effort: Pulmonary effort is  normal.  Musculoskeletal:     Right wrist: Swelling, tenderness and snuff box tenderness present. No deformity, effusion, lacerations, bony tenderness or crepitus. Normal range of motion. Normal pulse (+2 right radial pulse with less than 2 capillary refill to the right hand).     Right hand: Swelling, laceration (Dog bite wound) and tenderness (Dog bite wound) present. No deformity or bony tenderness. Normal range of motion. Normal strength. Normal sensation. There is no disruption of two-point discrimination. Normal capillary refill. Normal pulse.     Left hand: Normal.     Cervical back: Neck supple.     Comments: See images of dog bite wound below.  Skin:    General: Skin is warm and dry.     Capillary Refill: Capillary refill takes less than 2 seconds.     Findings: No rash.  Neurological:     General: No focal deficit present.     Mental Status: She is alert and oriented to person, place, and time. Mental status is at baseline.     Cranial Nerves: No dysarthria or facial asymmetry.  Psychiatric:        Mood and Affect: Mood normal.        Speech: Speech normal.        Behavior: Behavior normal.        Thought Content: Thought content normal.        Judgment: Judgment normal.        UC Treatments / Results  Labs (all labs ordered are listed, but only abnormal results are displayed) Labs Reviewed - No data to display  EKG   Radiology DG Hand Complete Right  Result Date: 07/09/2022 CLINICAL DATA:  Dog bite EXAM: RIGHT HAND - COMPLETE 3+ VIEW COMPARISON:  None Available. FINDINGS: No fracture or dislocation is seen. There are no opaque foreign bodies. IMPRESSION: No acute findings are seen in right hand. Electronically Signed   By: Ernie Avena M.D.   On: 07/09/2022 08:59    Procedures Procedures  (including critical care time)  Medications Ordered in UC Medications  Tdap (BOOSTRIX) injection 0.5 mL (0.5 mLs Intramuscular Given 07/09/22 0843)    Initial Impression / Assessment and Plan / UC Course  I have reviewed the triage vital signs and the nursing notes.  Pertinent labs & imaging results that were available during my care of the patient were reviewed by me and considered in my medical decision making (see chart for details).     *** Final Clinical Impressions(s) / UC Diagnoses   Final diagnoses:  Dog bite of right hand, initial encounter  Need for tetanus booster     Discharge Instructions      Start taking cefuroxime antibiotic twice daily and Flagyl antibiotic 3 times daily to prophylactically treat for infection to your dog bite wound. Take these medicines with food to avoid stomach upset. Do not drink any alcohol during your course of antibiotics and for at least 72 hours after taking Flagyl antibiotic due to significant potential side effects/interaction with alcohol.  Take Diflucan antifungal pill once today, then again in 7 days to treat for vaginal yeast infection associated with antibiotic use.  Monitor the sites for further signs of infection such as redness surrounding the site, pus drainage, or severe pain.  You may take ibuprofen 600 mg every 6 hours as needed for pain and swelling to the right hand.  Your x-ray is negative for signs of bony abnormality.  We updated your tetanus shot in the clinic  today.  Schedule an appointment with your primary care provider for ongoing evaluation and follow-up if your symptoms fail to improve in the next 2 to 3 days or if you develop any new or worsening symptoms.  You may also return to urgent care.  If your symptoms are severe, please go to the nearest emergency room for further evaluation. I hope you feel better!      ED Prescriptions     Medication Sig Dispense Auth. Provider   cefUROXime (CEFTIN) 500 MG  tablet Take 1 tablet (500 mg total) by mouth 2 (two) times daily with a meal for 7 days. 14 tablet Reita May M, FNP   metroNIDAZOLE (FLAGYL) 500 MG tablet Take 1 tablet (500 mg total) by mouth 3 (three) times daily for 7 days. 21 tablet Carlisle Beers, FNP      PDMP not reviewed this encounter.

## 2022-07-13 ENCOUNTER — Other Ambulatory Visit (HOSPITAL_COMMUNITY): Payer: Self-pay

## 2022-07-13 NOTE — Telephone Encounter (Signed)
Patient Advocate Encounter  Prior Authorization for Ozempic  2mg /34ml has been approved with [employer].    Per [facility] test claim, copay for 28 days supply is $24.99   Effective dates:  through 06/28/2023

## 2022-07-13 NOTE — Telephone Encounter (Signed)
Sent pt msg via [email or web address]../[email or web address] 

## 2022-07-31 ENCOUNTER — Other Ambulatory Visit (HOSPITAL_COMMUNITY): Payer: Self-pay

## 2022-08-30 ENCOUNTER — Ambulatory Visit: Payer: BC Managed Care – PPO | Admitting: Emergency Medicine

## 2022-11-29 IMAGING — MG DIGITAL DIAGNOSTIC BILAT W/ TOMO W/ CAD
8 of 14 series · 8 of 40 positions shown · non-contrast
Comparison: None available.

CLINICAL DATA: 35-year-old female with 2 palpable lumps in the
RIGHT breast identified on self-examination. Baseline mammogram.

EXAM:
DIGITAL DIAGNOSTIC BILATERAL MAMMOGRAM WITH TOMOSYNTHESIS AND CAD;
ULTRASOUND RIGHT BREAST LIMITED
TECHNIQUE: Bilateral digital diagnostic mammography and breast tomosynthesis
was performed. The images were evaluated with computer-aided
detection.; Targeted ultrasound examination of the right breast was
performed

[L CC synth-2D (1 of 2)]
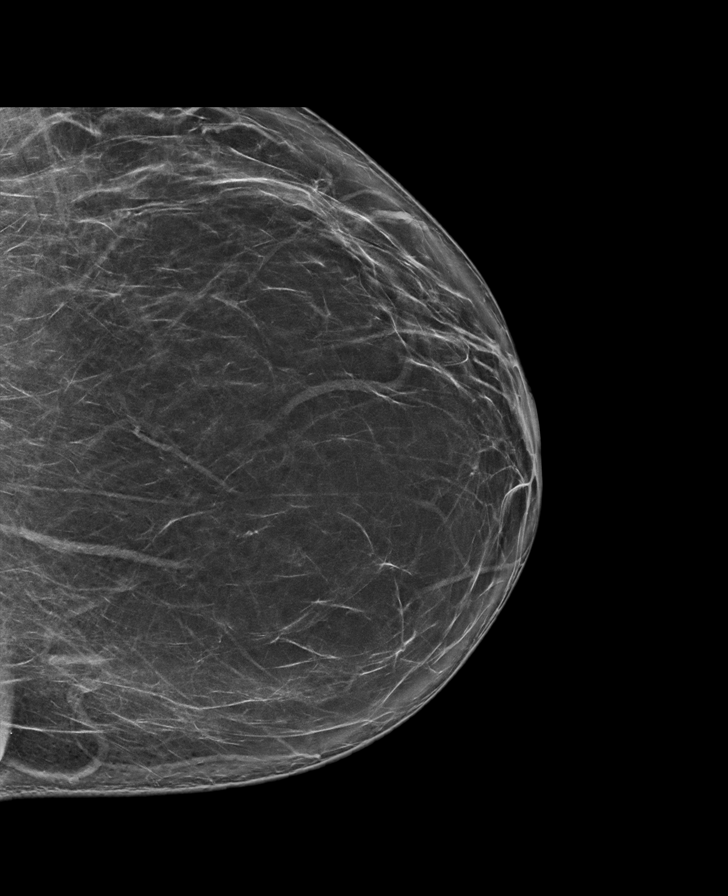

[L CC synth-2D (2 of 2)]
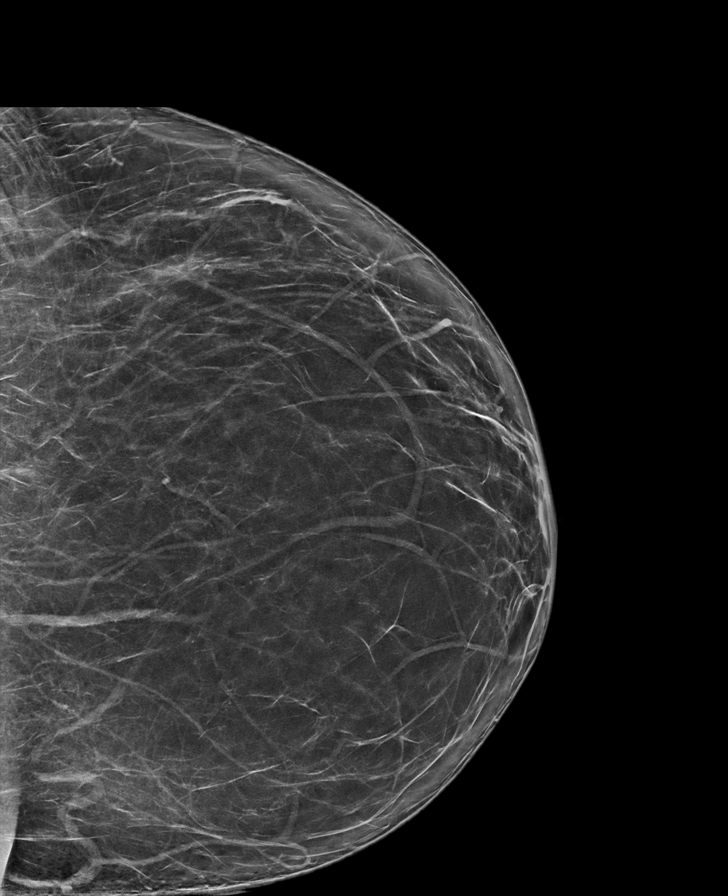

[R CC synth-2D]
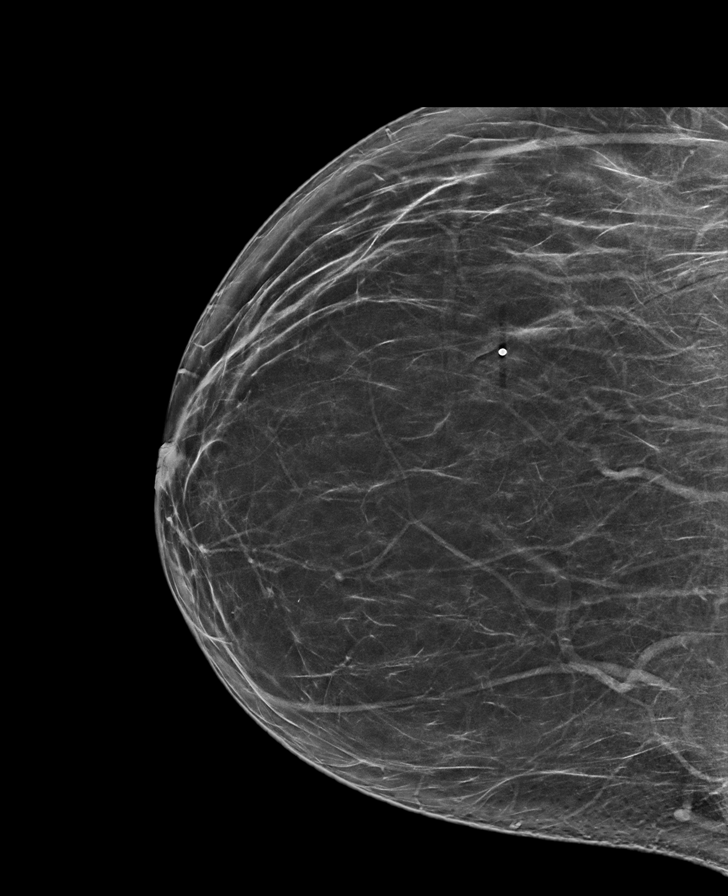

[L MLO synth-2D (1 of 2)]
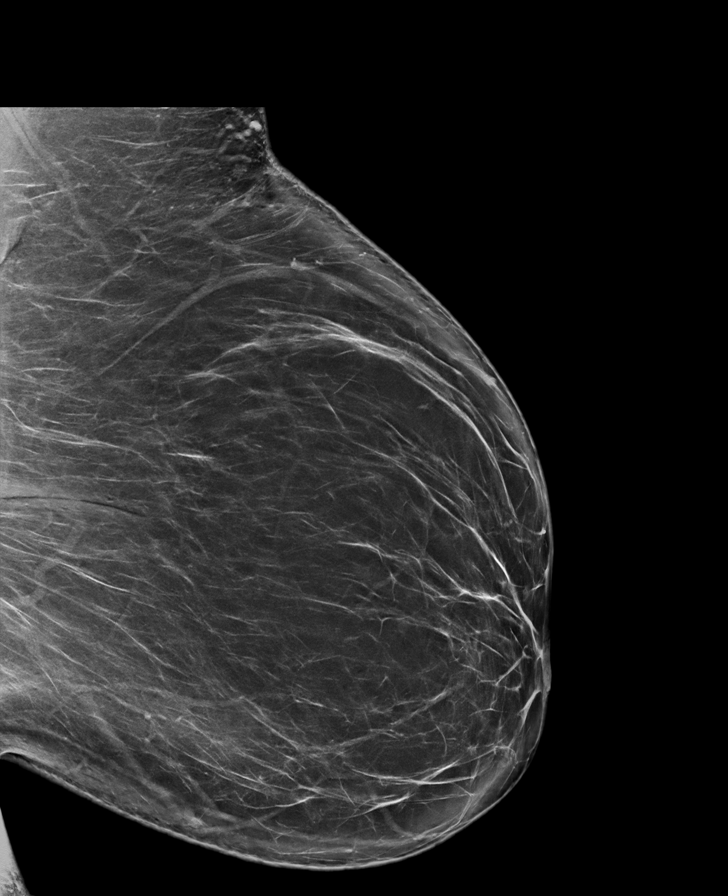

[R MLO synth-2D (1 of 2)]
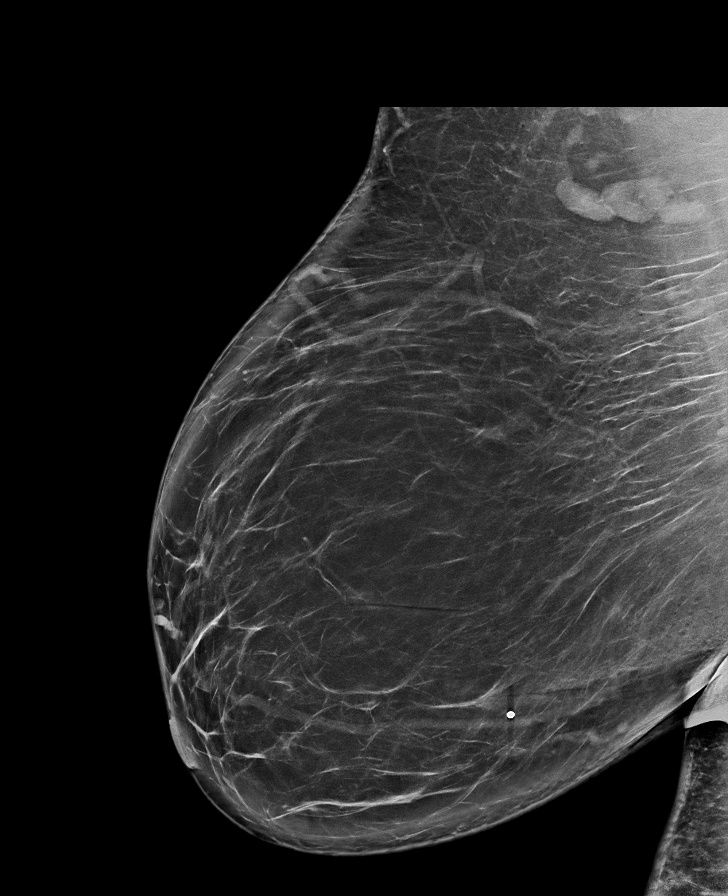

[L MLO synth-2D (2 of 2)]
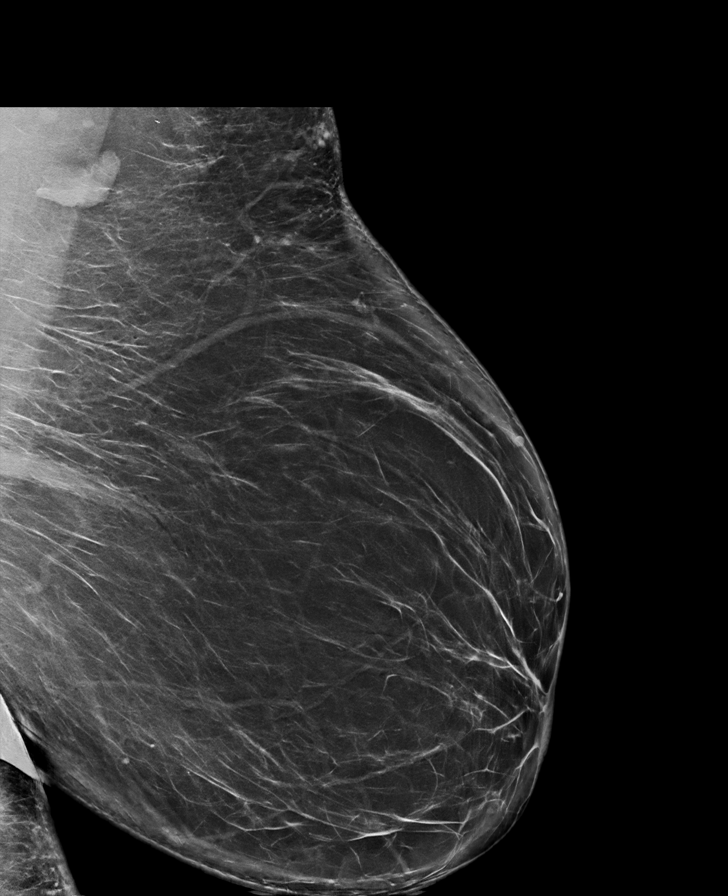

[R MLO synth-2D (2 of 2)]
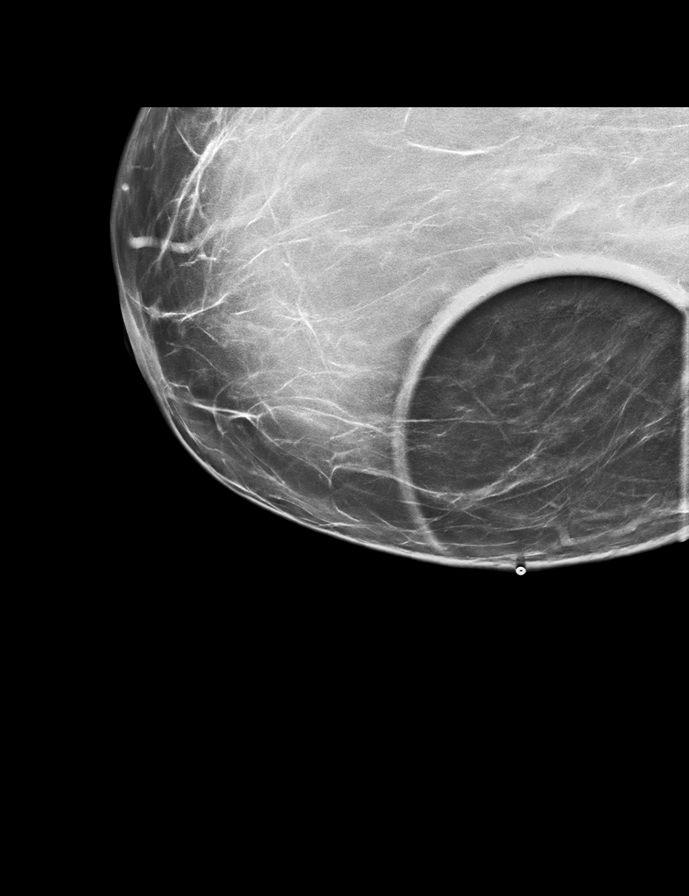

[R MLO tomo · tomo slice 53/104.0]
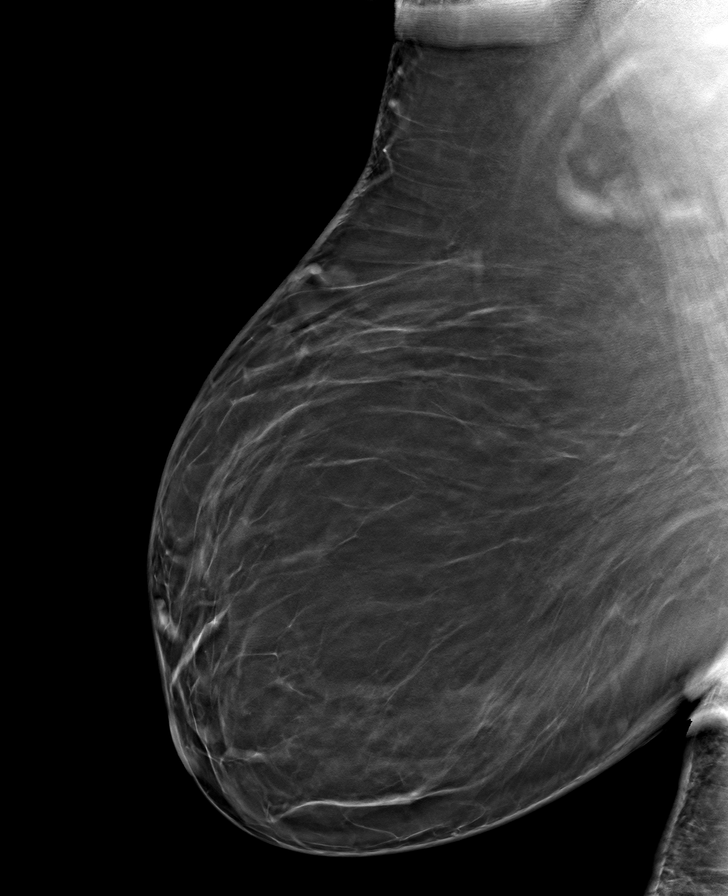

[8 of 40 positions shown; findings below may reference images not displayed]

ACR Breast Density Category b: There are scattered areas of
fibroglandular density.
FINDINGS: 2D/3D full field views of both breasts and a spot compression view
of the RIGHT breast demonstrate no suspicious mass, distortion or
worrisome calcifications.

On physical exam of the RIGHT breast, mild superficial focal
thickening is identified at the 7 o'clock position 8 cm from the
nipple and at the 8 o'clock position 14 cm from the nipple.

Targeted ultrasound is performed, showing 2 hypoechoic RIGHT breast
skin collections with tracks to the skin surface, compatible with
sebaceous cysts. These measure 0.8 x 0.2 x 1 cm at the 7 o'clock
position 8 cm from the nipple and 0.6 x 0.4 x 0.7 cm at the 8
o'clock position 14 cm from the nipple.
IMPRESSION: 1. Benign sebaceous cysts at the 7 o'clock and 8 o'clock positions
of the RIGHT breast corresponding to the patient's palpable
abnormalities.
2. No mammographic evidence of breast malignancy.

RECOMMENDATION:
Bilateral screening mammogram at age 40.

I have discussed the findings and recommendations with the patient.
If applicable, a reminder letter will be sent to the patient
regarding the next appointment.

BI-RADS CATEGORY  2: Benign.

## 2022-12-25 ENCOUNTER — Other Ambulatory Visit: Payer: Self-pay | Admitting: Emergency Medicine

## 2022-12-25 ENCOUNTER — Other Ambulatory Visit (HOSPITAL_COMMUNITY): Payer: Self-pay

## 2022-12-25 DIAGNOSIS — E1169 Type 2 diabetes mellitus with other specified complication: Secondary | ICD-10-CM

## 2022-12-25 MED ORDER — OZEMPIC (2 MG/DOSE) 8 MG/3ML ~~LOC~~ SOPN
2.0000 mg | PEN_INJECTOR | SUBCUTANEOUS | 5 refills | Status: DC
  Filled 2022-12-25 – 2023-01-04 (×2): qty 3, 28d supply, fill #0

## 2023-01-04 ENCOUNTER — Other Ambulatory Visit (HOSPITAL_COMMUNITY): Payer: Self-pay

## 2023-02-04 ENCOUNTER — Ambulatory Visit (INDEPENDENT_AMBULATORY_CARE_PROVIDER_SITE_OTHER): Payer: BC Managed Care – PPO | Admitting: Emergency Medicine

## 2023-02-04 ENCOUNTER — Other Ambulatory Visit (HOSPITAL_COMMUNITY): Payer: Self-pay

## 2023-02-04 ENCOUNTER — Encounter: Payer: Self-pay | Admitting: Emergency Medicine

## 2023-02-04 VITALS — BP 108/78 | HR 83 | Temp 97.7°F | Ht 67.0 in | Wt 262.6 lb

## 2023-02-04 DIAGNOSIS — Z7984 Long term (current) use of oral hypoglycemic drugs: Secondary | ICD-10-CM

## 2023-02-04 DIAGNOSIS — E1169 Type 2 diabetes mellitus with other specified complication: Secondary | ICD-10-CM

## 2023-02-04 DIAGNOSIS — Z0001 Encounter for general adult medical examination with abnormal findings: Secondary | ICD-10-CM

## 2023-02-04 DIAGNOSIS — Z114 Encounter for screening for human immunodeficiency virus [HIV]: Secondary | ICD-10-CM

## 2023-02-04 DIAGNOSIS — E785 Hyperlipidemia, unspecified: Secondary | ICD-10-CM

## 2023-02-04 DIAGNOSIS — Z789 Other specified health status: Secondary | ICD-10-CM

## 2023-02-04 DIAGNOSIS — E282 Polycystic ovarian syndrome: Secondary | ICD-10-CM

## 2023-02-04 DIAGNOSIS — Z1159 Encounter for screening for other viral diseases: Secondary | ICD-10-CM

## 2023-02-04 DIAGNOSIS — Z130 Encounter for screening for diseases of the blood and blood-forming organs and certain disorders involving the immune mechanism: Secondary | ICD-10-CM

## 2023-02-04 DIAGNOSIS — Z6841 Body Mass Index (BMI) 40.0 and over, adult: Secondary | ICD-10-CM

## 2023-02-04 DIAGNOSIS — Z23 Encounter for immunization: Secondary | ICD-10-CM

## 2023-02-04 LAB — POCT GLYCOSYLATED HEMOGLOBIN (HGB A1C): Hemoglobin A1C: 7.9 % — AB (ref 4.0–5.6)

## 2023-02-04 MED ORDER — EZETIMIBE 10 MG PO TABS
10.0000 mg | ORAL_TABLET | Freq: Every day | ORAL | 3 refills | Status: DC
  Filled 2023-02-04: qty 90, 90d supply, fill #0
  Filled 2023-04-25: qty 90, 90d supply, fill #1
  Filled 2023-08-15: qty 90, 90d supply, fill #2
  Filled 2024-01-16: qty 90, 90d supply, fill #3

## 2023-02-04 MED ORDER — GLIPIZIDE 5 MG PO TABS
5.0000 mg | ORAL_TABLET | Freq: Two times a day (BID) | ORAL | 3 refills | Status: DC
Start: 2023-02-04 — End: 2023-11-29
  Filled 2023-02-04: qty 60, 30d supply, fill #0
  Filled 2023-04-25: qty 60, 30d supply, fill #1

## 2023-02-04 MED ORDER — OZEMPIC (2 MG/DOSE) 8 MG/3ML ~~LOC~~ SOPN
2.0000 mg | PEN_INJECTOR | SUBCUTANEOUS | 5 refills | Status: DC
  Filled 2023-02-04: qty 3, 28d supply, fill #0
  Filled 2023-03-18: qty 3, 28d supply, fill #1
  Filled 2023-04-25: qty 3, 28d supply, fill #2
  Filled 2023-06-21: qty 3, 28d supply, fill #3
  Filled 2023-08-15: qty 3, 28d supply, fill #4

## 2023-02-04 NOTE — Patient Instructions (Signed)

## 2023-02-04 NOTE — Assessment & Plan Note (Signed)
Wt Readings from Last 3 Encounters:  02/04/23 262 lb 9.6 oz (119.1 kg)  05/31/22 255 lb 6 oz (115.8 kg)  03/01/22 264 lb (119.7 kg)  Diet and nutrition discussed Advised to decrease amount of daily carbohydrate intake and daily calories and increase amount of plant-based protein in her diet. Benefits of exercise discussed

## 2023-02-04 NOTE — Progress Notes (Signed)
Maria Maddox 37 y.o.   Chief Complaint  Patient presents with   Annual Exam    Patient here for physical no other concerns     HISTORY OF PRESENT ILLNESS: This is a 37 y.o. female here for annual exam and follow-up of chronic medical conditions including diabetes. States metformin is given her too many side effects including diarrhea and abdominal cramping. No other complaints or medical concerns today. Lab Results  Component Value Date   HGBA1C 6.7 (A) 05/31/2022   Wt Readings from Last 3 Encounters:  05/31/22 255 lb 6 oz (115.8 kg)  03/01/22 264 lb (119.7 kg)  01/04/22 250 lb (113.4 kg)   BP Readings from Last 3 Encounters:  07/09/22 121/76  05/31/22 110/70  03/01/22 120/72     HPI   Prior to Admission medications   Medication Sig Start Date End Date Taking? Authorizing Provider  ezetimibe (ZETIA) 10 MG tablet TAKE 1 TABLET (10 MG TOTAL) BY MOUTH DAILY. 02/14/21  Yes Rita Vialpando, Eilleen Kempf, MD  metFORMIN (GLUCOPHAGE) 500 MG tablet Take 2 tablets (1,000 mg total) by mouth 2 (two) times daily with a meal. 03/02/22  Yes Jolena Kittle, Eilleen Kempf, MD  Semaglutide, 2 MG/DOSE, (OZEMPIC, 2 MG/DOSE,) 8 MG/3ML SOPN Inject 2 mg as directed once a week. 12/25/22  Yes Brazil Voytko, Eilleen Kempf, MD  alclomethasone (ACLOVATE) 0.05 % cream Apply to face every day- DO NOT APPLY TO RIGHT EYE DUE TO GLAUCOMA Patient not taking: Reported on 02/04/2023 12/22/20   Mackey Birchwood R, PA-C  atropine 1 % ophthalmic solution as needed. Patient not taking: Reported on 02/04/2023    [provider]  brimonidine (ALPHAGAN P) 0.1 % SOLN Place 1 drop into both eyes 2 (two) times daily. Patient not taking: Reported on 02/04/2023    [provider]  dorzolamide-timolol (COSOPT) 22.3-6.8 MG/ML ophthalmic solution Place 1 drop into the right eye daily. Patient not taking: Reported on 02/04/2023    [provider]  potassium chloride (KLOR-CON) 10 MEQ tablet Take 1 tablet (10 mEq  total) by mouth 2 (two) times daily. Patient not taking: Reported on 03/01/2022 06/06/21   Raspet, Noberto Retort, PA-C  tacrolimus (PROTOPIC) 0.1 % ointment Apply to face every day Patient not taking: Reported on 03/01/2022 12/22/20   Glyn Ade, PA-C  timolol (TIMOPTIC) 0.25 % ophthalmic solution  04/23/19   [provider]  triamcinolone cream (KENALOG) 0.1 % Apply to affected area every day for body- NOT FOR FACE, FOLDS or GROIN Patient not taking: Reported on 03/01/2022 12/22/20   Glyn Ade, PA-C    Allergies  Allergen Reactions   Crestor [Rosuvastatin Calcium] Other (See Comments)    Per patient body pain   Amoxicillin Rash    bad yeast infection    Patient Active Problem List   Diagnosis Date Noted   Statin intolerance 02/04/2020   Body mass index (BMI) of 39.0-39.9 in adult 02/04/2020   Dyslipidemia associated with type 2 diabetes mellitus (HCC) 10/14/2016   Morbid obesity (HCC) 01/15/2013   Polycystic ovaries 01/15/2013   Glaucoma 07/02/2011    Past Medical History:  Diagnosis Date   Diabetes mellitus without complication (HCC)    Eczema    Glaucoma    has had for 10 yrears   Legally blind in right eye, as defined in [place]    PCOS (polycystic ovarian syndrome)     Past Surgical History:  Procedure Laterality Date   EYE SURGERY     right side, numerous surgeries (  now blind)   PILONIDAL CYST EXCISION N/A 10/26/2015   Procedure: CYST EXCISION PILONIDAL;  Surgeon: Abigail Miyamoto, MD;  Location: [facility];  Service: General;  Laterality: N/A;    Social History   Socioeconomic History   Marital status: Married    Spouse name: Not on file   Number of children: Not on file   Years of education: Not on file   Highest education level: Not on file  Occupational History   Not on file  Tobacco Use   Smoking status: Never   Smokeless tobacco: Never  Vaping Use   Vaping status: Never Used  Substance and Sexual Activity   Alcohol use:  No   Drug use: No   Sexual activity: Yes    Birth control/protection: None  Other Topics Concern   Not on file  Social History Narrative   Not on file   Social Determinants of Health   Financial Resource Strain: Not on file  Food Insecurity: Not on file  Transportation Needs: Not on file  Physical Activity: Not on file  Stress: Not on file  Social Connections: Not on file  Intimate Partner Violence: Not on file    Family History  Problem Relation Age of Onset   Arthritis Mother    Depression Mother    Diabetes Mother    Hyperlipidemia Mother    Arthritis Father    Breast cancer Maternal Aunt    Cancer Maternal Aunt 11       Breast Cancer   Breast cancer Paternal Aunt    Breast cancer Maternal Grandmother    Cancer Maternal Grandmother        breast   Diabetes Paternal Grandmother    Diabetes Paternal Grandfather    Hearing loss Paternal Grandfather      Review of Systems  Constitutional: Negative.  Negative for chills and fever.  HENT: Negative.  Negative for congestion and sore throat.   Eyes:        Blind from right eye  Respiratory: Negative.  Negative for cough and shortness of breath.   Cardiovascular: Negative.  Negative for chest pain and palpitations.  Gastrointestinal:  Negative for abdominal pain, diarrhea, nausea and vomiting.  Genitourinary: Negative.  Negative for dysuria and hematuria.  Skin: Negative.  Negative for rash.  Neurological: Negative.  Negative for dizziness and headaches.  All other systems reviewed and are negative.   Today's Vitals   02/04/23 1600  BP: 108/78  Pulse: 83  Temp: 97.7 F (36.5 C)  TempSrc: Oral  SpO2: 98%  Weight: 262 lb 9.6 oz (119.1 kg)  Height: 5\' 7"  (1.702 m)   Body mass index is 41.13 kg/m.   Physical Exam Vitals reviewed.  Constitutional:      Appearance: Normal appearance.  HENT:     Head: Normocephalic.     Mouth/Throat:     Mouth: Mucous membranes are moist.     Pharynx: Oropharynx is  clear.  Eyes:     Extraocular Movements: Extraocular movements intact.     Comments: Right eye whited out  Cardiovascular:     Rate and Rhythm: Normal rate and regular rhythm.     Pulses: Normal pulses.     Heart sounds: Normal heart sounds.  Pulmonary:     Effort: Pulmonary effort is normal.     Breath sounds: Normal breath sounds.  Abdominal:     Palpations: Abdomen is soft.     Tenderness: There is no abdominal tenderness.  Skin:  General: Skin is warm and dry.     Capillary Refill: Capillary refill takes less than 2 seconds.  Neurological:     General: No focal deficit present.     Mental Status: She is alert and oriented to person, place, and time.  Psychiatric:        Mood and Affect: Mood normal.        Behavior: Behavior normal.    Results for orders placed or performed in visit on 02/04/23 (from the past 24 hour(s))  POCT HgB A1C     Status: Abnormal   Collection Time: 02/04/23  4:12 PM  Result Value Ref Range   Hemoglobin A1C 7.9 (A) 4.0 - 5.6 %   HbA1c POC (<> result, manual entry)     HbA1c, POC (prediabetic range)     HbA1c, POC (controlled diabetic range)       ASSESSMENT & PLAN: Problem List Items Addressed This Visit       Endocrine   Polycystic ovaries    Need follow-up with GYN Not tolerating metformin      Dyslipidemia associated with type 2 diabetes mellitus (HCC)    Uncontrolled diabetes with hemoglobin A1c higher than before at 7.9. Intolerant to metformin.  Recommend to stop it. Cardiovascular risks associated with uncontrolled diabetes discussed Recommend to continue Ozempic 2 mg weekly and start glipizide 5 mg twice a day Intolerant to SGLT's due to recurrent vaginal yeast infections Diet and nutrition discussed Follow-up in 3 months      Relevant Medications   glipiZIDE (GLUCOTROL) 5 MG tablet   ezetimibe (ZETIA) 10 MG tablet   Semaglutide, 2 MG/DOSE, (OZEMPIC, 2 MG/DOSE,) 8 MG/3ML SOPN   Other Relevant Orders   Comprehensive  metabolic panel   Lipid panel   Urine Microalbumin w/creat. ratio   POCT HgB A1C (Completed)     Other   Morbid obesity (HCC)    Wt Readings from Last 3 Encounters:  02/04/23 262 lb 9.6 oz (119.1 kg)  05/31/22 255 lb 6 oz (115.8 kg)  03/01/22 264 lb (119.7 kg)  Diet and nutrition discussed Advised to decrease amount of daily carbohydrate intake and daily calories and increase amount of plant-based protein in her diet. Benefits of exercise discussed       Relevant Medications   glipiZIDE (GLUCOTROL) 5 MG tablet   Semaglutide, 2 MG/DOSE, (OZEMPIC, 2 MG/DOSE,) 8 MG/3ML SOPN   Statin intolerance    Continue Zetia 10 mg daily Lipid profile done today Cardiovascular risks associated with dyslipidemia discussed      Other Visit Diagnoses     Encounter for general adult medical examination with abnormal findings    -  Primary   Relevant Orders   CBC with Differential   Comprehensive metabolic panel   Lipid panel   TSH   HIV antibody   Hepatitis C antibody screen   Need for vaccination       Relevant Orders   Flu vaccine trivalent PF, 6mos and older(Flulaval,Afluria,Fluarix,Fluzone) (Completed)   Encounter for screening for HIV       Relevant Orders   HIV antibody   Need for hepatitis C screening test       Relevant Orders   Hepatitis C antibody screen   Screening for deficiency anemia       Relevant Orders   CBC with Differential   Screening for endocrine, metabolic and immunity disorder       Relevant Orders   Comprehensive metabolic panel   TSH  Modifiable risk factors discussed with patient. Anticipatory guidance according to age provided. The following topics were also discussed: Social Determinants of Health Smoking.  Non-smoker Diet and nutrition and need to decrease amount of daily carbohydrate intake and daily calories and increase amount of plant-based protein in her diet Benefits of exercise Cancer family history review Vaccinations review and  recommendations Cardiovascular risk assessment Review of multiple chronic medical conditions under management Review of all medications and changes made Mental health including depression and anxiety Fall and accident prevention  Patient Instructions  Health Maintenance, Female Adopting a healthy lifestyle and getting preventive care are important in promoting health and wellness. Ask your health care provider about: The right schedule for you to have regular tests and exams. Things you can do on your own to prevent diseases and keep yourself healthy. What should I know about diet, weight, and exercise? Eat a healthy diet  Eat a diet that includes plenty of vegetables, fruits, low-fat dairy products, and lean protein. Do not eat a lot of foods that are high in solid fats, added sugars, or sodium. Maintain a healthy weight Body mass index (BMI) is used to identify weight problems. It estimates body fat based on height and weight. Your health care provider can help determine your BMI and help you achieve or maintain a healthy weight. Get regular exercise Get regular exercise. This is one of the most important things you can do for your health. Most adults should: Exercise for at least 150 minutes each week. The exercise should increase your heart rate and make you sweat (moderate-intensity exercise). Do strengthening exercises at least twice a week. This is in addition to the moderate-intensity exercise. Spend less time sitting. Even light physical activity can be beneficial. Watch cholesterol and blood lipids Have your blood tested for lipids and cholesterol at 37 years of age, then have this test every 5 years. Have your cholesterol levels checked more often if: Your lipid or cholesterol levels are high. You are older than 37 years of age. You are at high risk for heart disease. What should I know about cancer screening? Depending on your health history and family history, you may need  to have cancer screening at various ages. This may include screening for: Breast cancer. Cervical cancer. Colorectal cancer. Skin cancer. Lung cancer. What should I know about heart disease, diabetes, and high blood pressure? Blood pressure and heart disease High blood pressure causes heart disease and increases the risk of stroke. This is more likely to develop in people who have high blood pressure readings or are overweight. Have your blood pressure checked: Every 3-5 years if you are 24-63 years of age. Every year if you are 4 years old or older. Diabetes Have regular diabetes screenings. This checks your fasting blood sugar level. Have the screening done: Once every three years after age 22 if you are at a normal weight and have a low risk for diabetes. More often and at a younger age if you are overweight or have a high risk for diabetes. What should I know about preventing infection? Hepatitis B If you have a higher risk for hepatitis B, you should be screened for this virus. Talk with your health care provider to find out if you are at risk for hepatitis B infection. Hepatitis C Testing is recommended for: Everyone born from [birth year] through 1965. Anyone with known risk factors for hepatitis C. Sexually transmitted infections (STIs) Get screened for STIs, including gonorrhea and chlamydia, if: You  are sexually active and are younger than 37 years of age. You are older than 38 years of age and your health care provider tells you that you are at risk for this type of infection. Your sexual activity has changed since you were last screened, and you are at increased risk for chlamydia or gonorrhea. Ask your health care provider if you are at risk. Ask your health care provider about whether you are at high risk for HIV. Your health care provider may recommend a prescription medicine to help prevent HIV infection. If you choose to take medicine to prevent HIV, you should first get tested  for HIV. You should then be tested every 3 months for as long as you are taking the medicine. Pregnancy If you are about to stop having your period (premenopausal) and you may become pregnant, seek counseling before you get pregnant. Take 400 to 800 micrograms (mcg) of folic acid every day if you become pregnant. Ask for birth control (contraception) if you want to prevent pregnancy. Osteoporosis and menopause Osteoporosis is a disease in which the bones lose minerals and strength with aging. This can result in bone fractures. If you are 36 years old or older, or if you are at risk for osteoporosis and fractures, ask your health care provider if you should: Be screened for bone loss. Take a calcium or vitamin D supplement to lower your risk of fractures. Be given hormone replacement therapy (HRT) to treat symptoms of menopause. Follow these instructions at home: Alcohol use Do not drink alcohol if: Your health care provider tells you not to drink. You are pregnant, may be pregnant, or are planning to become pregnant. If you drink alcohol: Limit how much you have to: 0-1 drink a day. Know how much alcohol is in your drink. In the [place] one drink equals one 12 oz bottle of beer (355 mL), one 5 oz glass of wine (148 mL), or one 1 oz glass of hard liquor (44 mL). Lifestyle Do not use any products that contain nicotine or tobacco. These products include cigarettes, chewing tobacco, and vaping devices, such as e-cigarettes. If you need help quitting, ask your health care provider. Do not use street drugs. Do not share needles. Ask your health care provider for help if you need support or information about quitting drugs. General instructions Schedule regular health, dental, and eye exams. Stay current with your vaccines. Tell your health care provider if: You often feel depressed. You have ever been abused or do not feel safe at home. Summary Adopting a healthy lifestyle and getting  preventive care are important in promoting health and wellness. Follow your health care provider's instructions about healthy diet, exercising, and getting tested or screened for diseases. Follow your health care provider's instructions on monitoring your cholesterol and blood pressure. This information is not intended to replace advice given to you by your health care provider. Make sure you discuss any questions you have with your health care provider. Document Revised: 07/25/2020 Document Reviewed: 07/25/2020 Elsevier Patient Education  2024 [employer]vier [employer]       Edwina Barth, MD [facility]

## 2023-02-04 NOTE — Assessment & Plan Note (Signed)
Need follow-up with GYN Not tolerating metformin

## 2023-02-04 NOTE — Assessment & Plan Note (Signed)
Uncontrolled diabetes with hemoglobin A1c higher than before at 7.9. Intolerant to metformin.  Recommend to stop it. Cardiovascular risks associated with uncontrolled diabetes discussed Recommend to continue Ozempic 2 mg weekly and start glipizide 5 mg twice a day Intolerant to SGLT's due to recurrent vaginal yeast infections Diet and nutrition discussed Follow-up in 3 months

## 2023-02-04 NOTE — Assessment & Plan Note (Signed)
Continue Zetia 10 mg daily Lipid profile done today Cardiovascular risks associated with dyslipidemia discussed

## 2023-02-05 ENCOUNTER — Other Ambulatory Visit: Payer: Self-pay

## 2023-02-08 ENCOUNTER — Other Ambulatory Visit (INDEPENDENT_AMBULATORY_CARE_PROVIDER_SITE_OTHER): Payer: BC Managed Care – PPO

## 2023-02-08 DIAGNOSIS — Z1159 Encounter for screening for other viral diseases: Secondary | ICD-10-CM

## 2023-02-08 DIAGNOSIS — E1169 Type 2 diabetes mellitus with other specified complication: Secondary | ICD-10-CM

## 2023-02-08 DIAGNOSIS — Z130 Encounter for screening for diseases of the blood and blood-forming organs and certain disorders involving the immune mechanism: Secondary | ICD-10-CM | POA: Diagnosis not present

## 2023-02-08 DIAGNOSIS — Z13228 Encounter for screening for other metabolic disorders: Secondary | ICD-10-CM

## 2023-02-08 DIAGNOSIS — E785 Hyperlipidemia, unspecified: Secondary | ICD-10-CM | POA: Diagnosis not present

## 2023-02-08 DIAGNOSIS — Z0001 Encounter for general adult medical examination with abnormal findings: Secondary | ICD-10-CM | POA: Diagnosis not present

## 2023-02-08 DIAGNOSIS — Z114 Encounter for screening for human immunodeficiency virus [HIV]: Secondary | ICD-10-CM

## 2023-02-08 DIAGNOSIS — Z1329 Encounter for screening for other suspected endocrine disorder: Secondary | ICD-10-CM

## 2023-02-08 LAB — COMPREHENSIVE METABOLIC PANEL
ALT: 12 U/L (ref 0–35)
AST: 13 U/L (ref 0–37)
Albumin: 3.8 g/dL (ref 3.5–5.2)
Alkaline Phosphatase: 65 U/L (ref 39–117)
BUN: 12 mg/dL (ref 6–23)
CO2: 24 meq/L (ref 19–32)
Calcium: 9.1 mg/dL (ref 8.4–10.5)
Chloride: 104 meq/L (ref 96–112)
Creatinine, Ser: 0.81 mg/dL (ref 0.40–1.20)
GFR: 92.91 mL/min (ref >60.00)
Glucose, Bld: 169 mg/dL — ABNORMAL HIGH (ref 70–99)
Potassium: 3.4 meq/L — ABNORMAL LOW (ref 3.5–5.1)
Sodium: 134 meq/L — ABNORMAL LOW (ref 135–145)
Total Bilirubin: 0.3 mg/dL (ref 0.2–1.2)
Total Protein: 7.9 g/dL (ref 6.0–8.3)

## 2023-02-08 LAB — CBC WITH DIFFERENTIAL/PLATELET
Basophils Absolute: 0 10*3/uL (ref 0.0–0.1)
Basophils Relative: 0.5 % (ref 0.0–3.0)
Eosinophils Absolute: 0.2 10*3/uL (ref 0.0–0.7)
Eosinophils Relative: 3.2 % (ref 0.0–5.0)
HCT: 37.7 % (ref 36.0–46.0)
Hemoglobin: 12.4 g/dL (ref 12.0–15.0)
Lymphocytes Relative: 42.8 % (ref 12.0–46.0)
Lymphs Abs: 2.6 10*3/uL (ref 0.7–4.0)
MCHC: 32.8 g/dL (ref 30.0–36.0)
MCV: 83.5 fL (ref 78.0–100.0)
Monocytes Absolute: 0.2 10*3/uL (ref 0.1–1.0)
Monocytes Relative: 3.9 % (ref 3.0–12.0)
Neutro Abs: 3 10*3/uL (ref 1.4–7.7)
Neutrophils Relative %: 49.6 % (ref 43.0–77.0)
Platelets: 353 10*3/uL (ref 150.0–400.0)
RBC: 4.52 Mil/uL (ref 3.87–5.11)
RDW: 13.4 % (ref 11.5–15.5)
WBC: 6 10*3/uL (ref 4.0–10.5)

## 2023-02-08 LAB — MICROALBUMIN / CREATININE URINE RATIO
Creatinine,U: 221.8 mg/dL
Microalb Creat Ratio: 3 mg/g (ref 0.0–30.0)
Microalb, Ur: 6.6 mg/dL — ABNORMAL HIGH (ref 0.0–1.9)

## 2023-02-08 LAB — LIPID PANEL
Cholesterol: 195 mg/dL (ref 0–200)
HDL: 39 mg/dL — ABNORMAL LOW (ref >39.00)
LDL Cholesterol: 130 mg/dL — ABNORMAL HIGH (ref 0–99)
NonHDL: 156.04
Total CHOL/HDL Ratio: 5
Triglycerides: 128 mg/dL (ref 0.0–149.0)
VLDL: 25.6 mg/dL (ref 0.0–40.0)

## 2023-02-08 LAB — TSH: TSH: 2.59 u[IU]/mL (ref 0.35–5.50)

## 2023-02-08 NOTE — Addendum Note (Signed)
Addended by: Edwena Felty T on: 02/08/2023 01:58 PM   Modules accepted: Orders

## 2023-02-09 LAB — HEPATITIS C ANTIBODY: Hepatitis C Ab: NONREACTIVE

## 2023-02-09 LAB — HIV ANTIBODY (ROUTINE TESTING W REFLEX): HIV 1&2 Ab, 4th Generation: NONREACTIVE

## 2023-03-18 ENCOUNTER — Other Ambulatory Visit (HOSPITAL_COMMUNITY): Payer: Self-pay

## 2023-05-07 ENCOUNTER — Ambulatory Visit: Payer: BC Managed Care – PPO | Admitting: Emergency Medicine

## 2023-05-07 ENCOUNTER — Encounter: Payer: Self-pay | Admitting: Emergency Medicine

## 2023-05-07 VITALS — BP 112/68 | HR 84 | Temp 98.8°F | Ht 67.0 in | Wt 264.0 lb

## 2023-05-07 DIAGNOSIS — E1169 Type 2 diabetes mellitus with other specified complication: Secondary | ICD-10-CM

## 2023-05-07 DIAGNOSIS — Z7985 Long-term (current) use of injectable non-insulin antidiabetic drugs: Secondary | ICD-10-CM

## 2023-05-07 DIAGNOSIS — E785 Hyperlipidemia, unspecified: Secondary | ICD-10-CM

## 2023-05-07 DIAGNOSIS — E282 Polycystic ovarian syndrome: Secondary | ICD-10-CM

## 2023-05-07 DIAGNOSIS — Z7984 Long term (current) use of oral hypoglycemic drugs: Secondary | ICD-10-CM

## 2023-05-07 LAB — POCT GLYCOSYLATED HEMOGLOBIN (HGB A1C): Hemoglobin A1C: 7.3 % — AB (ref 4.0–5.6)

## 2023-05-07 NOTE — Progress Notes (Signed)
Maria Maddox 37 y.o.   Chief Complaint  Patient presents with   Follow-up    3 month f/u for DM. No other concerns     HISTORY OF PRESENT ILLNESS: This is a 38 y.o. female here for 84-month follow-up of diabetes and dyslipidemia Eating better and losing weight. Overall feeling better.  Has no complaints or medical concerns today. Lab Results  Component Value Date   HGBA1C 7.9 (A) 02/04/2023   Wt Readings from Last 3 Encounters:  05/07/23 264 lb (119.7 kg)  02/04/23 262 lb 9.6 oz (119.1 kg)  05/31/22 255 lb 6 oz (115.8 kg)     HPI   Prior to Admission medications   Medication Sig Start Date End Date Taking? Authorizing Provider  ezetimibe (ZETIA) 10 MG tablet TAKE 1 TABLET (10 MG TOTAL) BY MOUTH DAILY. 02/04/23  Yes Bertran Zeimet, Eilleen Kempf, MD  glipiZIDE (GLUCOTROL) 5 MG tablet Take 1 tablet (5 mg total) by mouth 2 (two) times daily before a meal. 02/04/23  Yes Winthrop Shannahan, [place], MD  Semaglutide, 2 MG/DOSE, (OZEMPIC, 2 MG/DOSE,) 8 MG/3ML SOPN Inject 2 mg as directed once a week. 02/04/23  Yes Sherolyn Trettin, Eilleen Kempf, MD  alclomethasone (ACLOVATE) 0.05 % cream Apply to face every day- DO NOT APPLY TO RIGHT EYE DUE TO GLAUCOMA Patient not taking: Reported on 05/07/2023 12/22/20   Mackey Birchwood R, PA-C  atropine 1 % ophthalmic solution as needed. Patient not taking: Reported on 05/07/2023    [provider]  brimonidine (ALPHAGAN P) 0.1 % SOLN Place 1 drop into both eyes 2 (two) times daily. Patient not taking: Reported on 02/04/2023    [provider]  dorzolamide-timolol (COSOPT) 22.3-6.8 MG/ML ophthalmic solution Place 1 drop into the right eye daily. Patient not taking: Reported on 02/04/2023    [provider]  potassium chloride (KLOR-CON) 10 MEQ tablet Take 1 tablet (10 mEq total) by mouth 2 (two) times daily. Patient not taking: Reported on 05/07/2023 06/06/21   Raspet, Noberto Retort, PA-C  tacrolimus (PROTOPIC) 0.1 % ointment Apply to face every  day Patient not taking: Reported on 05/07/2023 12/22/20   Glyn Ade, PA-C  timolol (TIMOPTIC) 0.25 % ophthalmic solution  04/23/19   [provider]  triamcinolone cream (KENALOG) 0.1 % Apply to affected area every day for body- NOT FOR FACE, FOLDS or GROIN Patient not taking: Reported on 05/07/2023 12/22/20   Glyn Ade, PA-C    Allergies  Allergen Reactions   Crestor [Rosuvastatin Calcium] Other (See Comments)    Per patient body pain   Amoxicillin Rash    bad yeast infection    Patient Active Problem List   Diagnosis Date Noted   Statin intolerance 02/04/2020   Body mass index (BMI) of 39.0-39.9 in adult 02/04/2020   Dyslipidemia associated with type 2 diabetes mellitus (HCC) 10/14/2016   Morbid obesity (HCC) 01/15/2013   Polycystic ovaries 01/15/2013   Glaucoma 07/02/2011    Past Medical History:  Diagnosis Date   Diabetes mellitus without complication (HCC)    Eczema    Glaucoma    has had for 10 yrears   Legally blind in right eye, as defined in [place]    PCOS (polycystic ovarian syndrome)     Past Surgical History:  Procedure Laterality Date   EYE SURGERY     right side, numerous surgeries (now blind)   PILONIDAL CYST EXCISION N/A 10/26/2015   Procedure: CYST EXCISION PILONIDAL;  Surgeon: Abigail Miyamoto, MD;  Location: [facility];  Service: General;  Laterality: N/A;    Social History   Socioeconomic History   Marital status: Married    Spouse name: Not on file   Number of children: Not on file   Years of education: Not on file   Highest education level: Not on file  Occupational History   Not on file  Tobacco Use   Smoking status: Never   Smokeless tobacco: Never  Vaping Use   Vaping status: Never Used  Substance and Sexual Activity   Alcohol use: No   Drug use: No   Sexual activity: Yes    Birth control/protection: None  Other Topics Concern   Not on file  Social History Narrative   Not on file   Social  Drivers of Health   Financial Resource Strain: Not on file  Food Insecurity: Not on file  Transportation Needs: Not on file  Physical Activity: Not on file  Stress: Not on file  Social Connections: Not on file  Intimate Partner Violence: Not on file    Family History  Problem Relation Age of Onset   Arthritis Mother    Depression Mother    Diabetes Mother    Hyperlipidemia Mother    Arthritis Father    Breast cancer Maternal Aunt    Cancer Maternal Aunt 59       Breast Cancer   Breast cancer Paternal Aunt    Breast cancer Maternal Grandmother    Cancer Maternal Grandmother        breast   Diabetes Paternal Grandmother    Diabetes Paternal Grandfather    Hearing loss Paternal Grandfather      Review of Systems  Constitutional: Negative.  Negative for chills, fever and weight loss.  HENT: Negative.  Negative for congestion and sore throat.   Respiratory: Negative.  Negative for cough and shortness of breath.   Cardiovascular: Negative.  Negative for chest pain and palpitations.  Gastrointestinal:  Negative for abdominal pain, nausea and vomiting.  Genitourinary: Negative.  Negative for dysuria and hematuria.  Skin: Negative.  Negative for rash.  Neurological:  Negative for dizziness and headaches.  All other systems reviewed and are negative.   Vitals:   05/07/23 1545  BP: 112/68  Pulse: 84  Temp: 98.8 F (37.1 C)  SpO2: 97%    Physical Exam Vitals reviewed.  Constitutional:      Appearance: Normal appearance. She is obese.  HENT:     Head: Normocephalic.     Mouth/Throat:     Mouth: Mucous membranes are moist.     Pharynx: Oropharynx is clear.  Cardiovascular:     Rate and Rhythm: Normal rate.  Pulmonary:     Effort: Pulmonary effort is normal.  Skin:    General: Skin is warm and dry.  Neurological:     Mental Status: She is alert and oriented to person, place, and time.  Psychiatric:        Mood and Affect: Mood normal.        Behavior: Behavior  normal.    Results for orders placed or performed in visit on 05/07/23 (from the past 24 hours)  POCT HgB A1C     Status: Abnormal   Collection Time: 05/07/23  4:46 PM  Result Value Ref Range   Hemoglobin A1C 7.3 (A) 4.0 - 5.6 %   HbA1c POC (<> result, manual entry)     HbA1c, POC (prediabetic range)     HbA1c, POC (controlled diabetic range)  ASSESSMENT & PLAN: A total of 44 minutes was spent with the patient and counseling/coordination of care regarding preparing for this visit, review of most recent office visit notes, review of multiple chronic medical conditions and their management, cardiovascular risks associated with diabetes and dyslipidemia, review of all medications, review of most recent bloodwork results and interpretation of today's hemoglobin A1c, review of health maintenance items, education on nutrition, prognosis, documentation, and need for follow up.   Problem List Items Addressed This Visit       Endocrine   Polycystic ovaries   Need follow-up with GYN Not tolerating metformin      Dyslipidemia associated with type 2 diabetes mellitus (HCC) - Primary   Hemoglobin A1c better than before at 7.3 Eating better.  Diet and nutrition discussed Recommend to continue weekly Ozempic 2 mg and glipizide 5 mg twice a day Cardiovascular risks associated with diabetes and dyslipidemia discussed Continue Zetia 10 mg daily Follow-up in 3 months        Other   Morbid obesity (HCC)   Diet and nutrition discussed Advised to decrease amount of daily carbohydrate intake and daily calories and increase amount of plant-based protein in her diet. Benefits of exercise discussed      Patient Instructions  Diabetes Mellitus and Nutrition, Adult When you have diabetes, or diabetes mellitus, it is very important to have healthy eating habits because your blood sugar (glucose) levels are greatly affected by what you eat and drink. Eating healthy foods in the right amounts,  at about the same times every day, can help you: Manage your blood glucose. Lower your risk of heart disease. Improve your blood pressure. Reach or maintain a healthy weight. What can affect my meal plan? Every person with diabetes is different, and each person has different needs for a meal plan. Your health care provider may recommend that you work with a dietitian to make a meal plan that is best for you. Your meal plan may vary depending on factors such as: The calories you need. The medicines you take. Your weight. Your blood glucose, blood pressure, and cholesterol levels. Your activity level. Other health conditions you have, such as heart or kidney disease. How do carbohydrates affect me? Carbohydrates, also called carbs, affect your blood glucose level more than any other type of food. Eating carbs raises the amount of glucose in your blood. It is important to know how many carbs you can safely have in each meal. This is different for every person. Your dietitian can help you calculate how many carbs you should have at each meal and for each snack. How does alcohol affect me? Alcohol can cause a decrease in blood glucose (hypoglycemia), especially if you use insulin or take certain diabetes medicines by mouth. Hypoglycemia can be a life-threatening condition. Symptoms of hypoglycemia, such as sleepiness, dizziness, and confusion, are similar to symptoms of having too much alcohol. Do not drink alcohol if: Your health care provider tells you not to drink. You are pregnant, may be pregnant, or are planning to become pregnant. If you drink alcohol: Limit how much you have to: 0-1 drink a day for women. 0-2 drinks a day for men. Know how much alcohol is in your drink. In the [place] one drink equals one 12 oz bottle of beer (355 mL), one 5 oz glass of wine (148 mL), or one 1 oz glass of hard liquor (44 mL). Keep yourself hydrated with water, diet soda, or unsweetened iced tea. Keep in  mind that regular soda, juice, and other mixers may contain a lot of sugar and must be counted as carbs. What are tips for following this plan?  Reading food labels Start by checking the serving size on the Nutrition Facts label of packaged foods and drinks. The number of calories and the amount of carbs, fats, and other nutrients listed on the label are based on one serving of the item. Many items contain more than one serving per package. Check the total grams (g) of carbs in one serving. Check the number of grams of saturated fats and trans fats in one serving. Choose foods that have a low amount or none of these fats. Check the number of milligrams (mg) of salt (sodium) in one serving. Most people should limit total sodium intake to less than 2,300 mg per day. Always check the nutrition information of foods labeled as "low-fat" or "nonfat." These foods may be higher in added sugar or refined carbs and should be avoided. Talk to your dietitian to identify your daily goals for nutrients listed on the label. Shopping Avoid buying canned, pre-made, or processed foods. These foods tend to be high in fat, sodium, and added sugar. Shop around the outside edge of the grocery store. This is where you will most often find fresh fruits and vegetables, bulk grains, fresh meats, and fresh dairy products. Cooking Use low-heat cooking methods, such as baking, instead of high-heat cooking methods, such as deep frying. Cook using healthy oils, such as olive, canola, or sunflower oil. Avoid cooking with butter, cream, or high-fat meats. Meal planning Eat meals and snacks regularly, preferably at the same times every day. Avoid going long periods of time without eating. Eat foods that are high in fiber, such as fresh fruits, vegetables, beans, and whole grains. Eat 4-6 oz (112-168 g) of lean protein each day, such as lean meat, chicken, fish, eggs, or tofu. One ounce (oz) (28 g) of lean protein is equal to: 1  oz (28 g) of meat, chicken, or fish. 1 egg.  cup (62 g) of tofu. Eat some foods each day that contain healthy fats, such as avocado, nuts, seeds, and fish. What foods should I eat? Fruits Berries. Apples. Oranges. Peaches. Apricots. Plums. Grapes. Mangoes. Papayas. Pomegranates. Kiwi. Cherries. Vegetables Leafy greens, including lettuce, spinach, kale, chard, collard greens, mustard greens, and cabbage. Beets. Cauliflower. Broccoli. Carrots. Green beans. Tomatoes. Peppers. Onions. Cucumbers. Brussels sprouts. Grains Whole grains, such as whole-wheat or whole-grain bread, crackers, tortillas, cereal, and pasta. Unsweetened oatmeal. Quinoa. Brown or wild rice. Meats and other proteins Seafood. Poultry without skin. Lean cuts of poultry and beef. Tofu. Nuts. Seeds. Dairy Low-fat or fat-free dairy products such as milk, yogurt, and cheese. The items listed above may not be a complete list of foods and beverages you can eat and drink. Contact a dietitian for more information. What foods should I avoid? Fruits Fruits canned with syrup. Vegetables Canned vegetables. Frozen vegetables with butter or cream sauce. Grains Refined white flour and flour products such as bread, pasta, snack foods, and cereals. Avoid all processed foods. Meats and other proteins Fatty cuts of meat. Poultry with skin. Breaded or fried meats. Processed meat. Avoid saturated fats. Dairy Full-fat yogurt, cheese, or milk. Beverages Sweetened drinks, such as soda or iced tea. The items listed above may not be a complete list of foods and beverages you should avoid. Contact a dietitian for more information. Questions to ask a health care provider Do I need to meet  with a certified diabetes care and education specialist? Do I need to meet with a dietitian? What number can I call if I have questions? When are the best times to check my blood glucose? Where to find more information: American Diabetes Association:  diabetes.org Academy of Nutrition and Dietetics: eatright.[employer] of Diabetes and Digestive and Kidney Diseases: StageSync.si Association of Diabetes Care & Education Specialists: diabeteseducator.org Summary It is important to have healthy eating habits because your blood sugar (glucose) levels are greatly affected by what you eat and drink. It is important to use alcohol carefully. A healthy meal plan will help you manage your blood glucose and lower your risk of heart disease. Your health care provider may recommend that you work with a dietitian to make a meal plan that is best for you. This information is not intended to replace advice given to you by your health care provider. Make sure you discuss any questions you have with your health care provider. Document Revised: 10/06/2019 Document Reviewed: 10/07/2019 Elsevier Patient Education  2024 Elsevier Inc.    Edwina Barth, MD [facility]

## 2023-05-07 NOTE — Assessment & Plan Note (Signed)
Diet and nutrition discussed. Advised to decrease amount of daily carbohydrate intake and daily calories and increase amount of plant based protein in her diet Benefits of exercise discussed.

## 2023-05-07 NOTE — Patient Instructions (Signed)

## 2023-05-07 NOTE — Assessment & Plan Note (Signed)
 Need follow-up with GYN Not tolerating metformin

## 2023-05-07 NOTE — Assessment & Plan Note (Signed)
Hemoglobin A1c better than before at 7.3 Eating better.  Diet and nutrition discussed Recommend to continue weekly Ozempic 2 mg and glipizide 5 mg twice a day Cardiovascular risks associated with diabetes and dyslipidemia discussed Continue Zetia 10 mg daily Follow-up in 3 months

## 2023-06-25 ENCOUNTER — Other Ambulatory Visit (HOSPITAL_COMMUNITY): Payer: Self-pay

## 2023-07-01 ENCOUNTER — Other Ambulatory Visit (HOSPITAL_COMMUNITY): Payer: Self-pay

## 2023-07-01 ENCOUNTER — Encounter: Payer: Self-pay | Admitting: Emergency Medicine

## 2023-07-01 ENCOUNTER — Ambulatory Visit: Admitting: Emergency Medicine

## 2023-07-01 VITALS — BP 112/76 | HR 77 | Temp 98.0°F | Ht 67.0 in | Wt 265.0 lb

## 2023-07-01 DIAGNOSIS — H01001 Unspecified blepharitis right upper eyelid: Secondary | ICD-10-CM | POA: Diagnosis not present

## 2023-07-01 DIAGNOSIS — E785 Hyperlipidemia, unspecified: Secondary | ICD-10-CM

## 2023-07-01 DIAGNOSIS — E1169 Type 2 diabetes mellitus with other specified complication: Secondary | ICD-10-CM

## 2023-07-01 DIAGNOSIS — Z7985 Long-term (current) use of injectable non-insulin antidiabetic drugs: Secondary | ICD-10-CM

## 2023-07-01 DIAGNOSIS — Z7984 Long term (current) use of oral hypoglycemic drugs: Secondary | ICD-10-CM | POA: Diagnosis not present

## 2023-07-01 MED ORDER — ERYTHROMYCIN 5 MG/GM OP OINT
1.0000 | TOPICAL_OINTMENT | Freq: Every day | OPHTHALMIC | 0 refills | Status: DC
  Filled 2023-07-01: qty 3.5, 3d supply, fill #0

## 2023-07-01 MED ORDER — AZITHROMYCIN 250 MG PO TABS
ORAL_TABLET | ORAL | 0 refills | Status: AC
  Filled 2023-07-01: qty 6, 5d supply, fill #0

## 2023-07-01 MED ORDER — FLUCONAZOLE 150 MG PO TABS
150.0000 mg | ORAL_TABLET | Freq: Once | ORAL | 0 refills | Status: AC
  Filled 2023-07-01: qty 1, 1d supply, fill #0

## 2023-07-01 NOTE — Assessment & Plan Note (Signed)
 Hemoglobin A1c better than before at 7.3 Eating better.  Diet and nutrition discussed Recommend to continue weekly Ozempic 2 mg and glipizide 5 mg twice a day Cardiovascular risks associated with diabetes and dyslipidemia discussed Continue Zetia 10 mg daily Follow-up in 3 months

## 2023-07-01 NOTE — Assessment & Plan Note (Signed)
 Active infection.  Diabetic. Recommend oral antibiotic, Duricef 500 mg twice a day and topical ophthalmic erythromycin at bedtime Advised to continue using warm compresses Tylenol and or Advil as needed for pain Advised to contact the office if no better or worse during the next several days.

## 2023-07-01 NOTE — Progress Notes (Signed)
 Maria Maddox 38 y.o.   Chief Complaint  Patient presents with   Eye Problem    Right eye issue, ben going on since last Thursday, have been putting warm compress which has not helped, swollen eyelid, have it happened before( years ago), some drainage to it     HISTORY OF PRESENT ILLNESS: This is a 38 y.o. female complaining of swelling and tenderness to right upper eyelid that started last week Progressively getting worse.  Diabetic.  Last A1c 7.0 done 2 weeks ago outside this office better than before. No other associated symptoms No other complaints or medical concerns today  Eye Problem  Pertinent negatives include no fever, nausea or vomiting.     Prior to Admission medications   Medication Sig Start Date End Date Taking? Authorizing Provider  alclomethasone (ACLOVATE) 0.05 % cream Apply to face every day- DO NOT APPLY TO RIGHT EYE DUE TO GLAUCOMA 12/22/20  Yes Sheffield, Kelli R, PA-C  atropine 1 % ophthalmic solution as needed.   Yes [provider]  azithromycin (ZITHROMAX) 250 MG tablet Sig as indicated 07/01/23  Yes Lamorris Knoblock, Eilleen Kempf, MD  brimonidine (ALPHAGAN P) 0.1 % SOLN Place 1 drop into both eyes 2 (two) times daily.   Yes [provider]  dorzolamide-timolol (COSOPT) 22.3-6.8 MG/ML ophthalmic solution Place 1 drop into the right eye daily.   Yes [provider]  erythromycin ophthalmic ointment Place 1 Application into the right eye at bedtime. 07/01/23  Yes Ranbir Chew, Eilleen Kempf, MD  ezetimibe (ZETIA) 10 MG tablet TAKE 1 TABLET (10 MG TOTAL) BY MOUTH DAILY. 02/04/23  Yes Ariahna Smiddy, Eilleen Kempf, MD  fluconazole (DIFLUCAN) 150 MG tablet Take 1 tablet (150 mg total) by mouth once for 1 dose. Sig one (1) tablet daily for 3 days. 07/01/23 07/01/23 Yes Maegen Wigle, Eilleen Kempf, MD  glipiZIDE (GLUCOTROL) 5 MG tablet Take 1 tablet (5 mg total) by mouth 2 (two) times daily before a meal. 02/04/23  Yes Lyndsey Demos, Eilleen Kempf, MD  potassium chloride  (KLOR-CON) 10 MEQ tablet Take 1 tablet (10 mEq total) by mouth 2 (two) times daily. 06/06/21  Yes Raspet, Erin K, PA-C  Semaglutide, 2 MG/DOSE, (OZEMPIC, 2 MG/DOSE,) 8 MG/3ML SOPN Inject 2 mg as directed once a week. 02/04/23  Yes Georgina Quint, MD  tacrolimus (PROTOPIC) 0.1 % ointment Apply to face every day 12/22/20  Yes Sheffield, Judye Bos, PA-C  timolol (TIMOPTIC) 0.25 % ophthalmic solution  04/23/19  Yes [provider]  triamcinolone cream (KENALOG) 0.1 % Apply to affected area every day for body- NOT FOR FACE, FOLDS or GROIN 12/22/20  Yes Sheffield, [place] R, PA-C    Allergies  Allergen Reactions   Crestor [Rosuvastatin Calcium] Other (See Comments)    Per patient body pain   Amoxicillin Rash    bad yeast infection    Patient Active Problem List   Diagnosis Date Noted   Statin intolerance 02/04/2020   Body mass index (BMI) of 39.0-39.9 in adult 02/04/2020   Dyslipidemia associated with type 2 diabetes mellitus (HCC) 10/14/2016   Morbid obesity (HCC) 01/15/2013   Polycystic ovaries 01/15/2013   Glaucoma 07/02/2011    Past Medical History:  Diagnosis Date   Diabetes mellitus without complication (HCC)    Eczema    Glaucoma    has had for 10 yrears   Legally blind in right eye, as defined in [place]    PCOS (polycystic ovarian syndrome)     Past Surgical History:  Procedure Laterality Date  EYE SURGERY     right side, numerous surgeries (now blind)   PILONIDAL CYST EXCISION N/A 10/26/2015   Procedure: CYST EXCISION PILONIDAL;  Surgeon: Oza Blumenthal, MD;  Location: [facility];  Service: General;  Laterality: N/A;    Social History   Socioeconomic History   Marital status: Married    Spouse name: Not on file   Number of children: Not on file   Years of education: Not on file   Highest education level: Not on file  Occupational History   Not on file  Tobacco Use   Smoking status: Never   Smokeless tobacco: Never  Vaping Use   Vaping  status: Never Used  Substance and Sexual Activity   Alcohol use: No   Drug use: No   Sexual activity: Yes    Birth control/protection: None  Other Topics Concern   Not on file  Social History Narrative   Not on file   Social Drivers of Health   Financial Resource Strain: Not on file  Food Insecurity: Not on file  Transportation Needs: Not on file  Physical Activity: Not on file  Stress: Not on file  Social Connections: Not on file  Intimate Partner Violence: Not on file    Family History  Problem Relation Age of Onset   Arthritis Mother    Depression Mother    Diabetes Mother    Hyperlipidemia Mother    Arthritis Father    Breast cancer Maternal Aunt    Cancer Maternal Aunt 77       Breast Cancer   Breast cancer Paternal Aunt    Breast cancer Maternal Grandmother    Cancer Maternal Grandmother        breast   Diabetes Paternal Grandmother    Diabetes Paternal Grandfather    Hearing loss Paternal Grandfather      Review of Systems  Constitutional:  Negative for chills and fever.  HENT:  Negative for congestion and sore throat.   Respiratory:  Negative for cough and shortness of breath.   Cardiovascular:  Negative for chest pain and palpitations.  Gastrointestinal:  Negative for abdominal pain, nausea and vomiting.  Neurological:  Negative for dizziness and headaches.  All other systems reviewed and are negative.   Vitals:   07/01/23 0905  BP: 112/76  Pulse: 77  Temp: 98 F (36.7 C)  SpO2: 99%    Physical Exam Vitals reviewed.  Constitutional:      Appearance: Normal appearance.  HENT:     Head: Normocephalic.  Eyes:     Extraocular Movements: Extraocular movements intact.     Comments: Right eye: Blindness Swollen and tender upper eyelid Mild discharge noted  Cardiovascular:     Rate and Rhythm: Normal rate and regular rhythm.     Pulses: Normal pulses.     Heart sounds: Normal heart sounds.  Pulmonary:     Effort: Pulmonary effort is  normal.     Breath sounds: Normal breath sounds.  Skin:    General: Skin is warm and dry.     Capillary Refill: Capillary refill takes less than 2 seconds.  Neurological:     General: No focal deficit present.     Mental Status: She is alert and oriented to person, place, and time.  Psychiatric:        Mood and Affect: Mood normal.        Behavior: Behavior normal.      ASSESSMENT & PLAN: A total of 32 minutes  was spent with the patient and counseling/coordination of care regarding preparing for this visit, review of most recent office visit notes, review of multiple chronic medical conditions and their management, diagnosis of eyelid infection and need for antibiotics, review of all medications, review of most recent bloodwork results, review of health maintenance items, education on nutrition, prognosis, documentation, and need for follow up.   Problem List Items Addressed This Visit       Endocrine   Dyslipidemia associated with type 2 diabetes mellitus (HCC)   Hemoglobin A1c better than before at 7.3 Eating better.  Diet and nutrition discussed Recommend to continue weekly Ozempic 2 mg and glipizide 5 mg twice a day Cardiovascular risks associated with diabetes and dyslipidemia discussed Continue Zetia 10 mg daily Follow-up in 3 months        Other   Blepharitis of right upper eyelid - Primary   Active infection.  Diabetic. Recommend oral antibiotic, Duricef 500 mg twice a day and topical ophthalmic erythromycin at bedtime Advised to continue using warm compresses Tylenol and or Advil as needed for pain Advised to contact the office if no better or worse during the next several days.      Relevant Medications   fluconazole (DIFLUCAN) 150 MG tablet   azithromycin (ZITHROMAX) 250 MG tablet   erythromycin ophthalmic ointment   Patient Instructions  Blepharitis Blepharitis is swelling of the eyelids. It can cause the eyes to feel dry or gritty. Other symptoms may  include: Reddish, scaly skin around the scalp and eyebrows. Eyelids that itch or burn. Fluid that leaks from the eye at night. This causes the eyelashes to stick together in the morning. Eyelashes that fall out. Redness of the eyes. Eyes that are sensitive to light. Follow these instructions at home: Watch for any changes in how your eyes look or feel. Tell your doctor about any changes. Follow these instructions to help with your condition. Keeping clean Wash your hands often with soap and water for at least 20 seconds. Clean your eyes. Wash the edges of your eyelids using eyelid wipes or a small amount of baby shampoo that has been mixed with warm water (diluted). Do this 2 or more times a day. Wash your face and eyebrows at least once a day. Use a clean towel each time you dry your eyelids. Do not use the towel to clean or dry other areas of your body. Do not share your towel with anyone. General instructions Avoid wearing makeup until you get better. Do not share makeup with anyone. Avoid rubbing your eyes. Use a warm compress on your eyes for 5-10 minutes at a time. Do this 1 or 2 times a day, or as told by your doctor. You can use: A towel with warm water on it. A heating pad that can be warmed in the microwave. The pad should be very warm but not hot enough to burn the skin. If you were given an antibiotic cream or eye drops, use the medicine as told by your doctor. Do not stop using the medicine even if you feel better. Keep all follow-up visits. Contact a doctor if: Your eyelids feel hot. You have blisters on your eyelids. You have a rash on your eyelids. The swelling does not go away in 2-4 days. The swelling gets worse. Get help right away if: You have pain that gets worse or spreads to other parts of your face. You have redness that gets worse or spreads to other parts of your  face. You have changes in how you see (vision). You have pain when you look at lights or things  that move. You have a fever. Summary Blepharitis is swelling of the eyelids. Watch for any changes in how your eyes look or feel. Tell your doctor about any changes. Follow home care instructions as told by your doctor. Wash your hands often with soap and water for at least 20 seconds. Avoid wearing makeup. Do not rub your eyes. Use a warm compress, creams, or eye drops as told by your doctor. Let your doctor know if you have changes in how you see, blisters or a rash on your eyelids, or other problems. This information is not intended to replace advice given to you by your health care provider. Make sure you discuss any questions you have with your health care provider. Document Revised: 04/06/2020 Document Reviewed: 04/06/2020 Elsevier Patient Education  2024 [employer]    Maryagnes Small, MD [facility]

## 2023-07-01 NOTE — Patient Instructions (Signed)
Blepharitis Blepharitis is swelling of the eyelids. It can cause the eyes to feel dry or gritty. Other symptoms may include: Reddish, scaly skin around the scalp and eyebrows. Eyelids that itch or burn. Fluid that leaks from the eye at night. This causes the eyelashes to stick together in the morning. Eyelashes that fall out. Redness of the eyes. Eyes that are sensitive to light. Follow these instructions at home: Watch for any changes in how your eyes look or feel. Tell your doctor about any changes. Follow these instructions to help with your condition. Keeping clean Wash your hands often with soap and water for at least 20 seconds. Clean your eyes. Wash the edges of your eyelids using eyelid wipes or a small amount of baby shampoo that has been mixed with warm water (diluted). Do this 2 or more times a day. Wash your face and eyebrows at least once a day. Use a clean towel each time you dry your eyelids. Do not use the towel to clean or dry other areas of your body. Do not share your towel with anyone. General instructions Avoid wearing makeup until you get better. Do not share makeup with anyone. Avoid rubbing your eyes. Use a warm compress on your eyes for 5-10 minutes at a time. Do this 1 or 2 times a day, or as told by your doctor. You can use: A towel with warm water on it. A heating pad that can be warmed in the microwave. The pad should be very warm but not hot enough to burn the skin. If you were given an antibiotic cream or eye drops, use the medicine as told by your doctor. Do not stop using the medicine even if you feel better. Keep all follow-up visits. Contact a doctor if: Your eyelids feel hot. You have blisters on your eyelids. You have a rash on your eyelids. The swelling does not go away in 2-4 days. The swelling gets worse. Get help right away if: You have pain that gets worse or spreads to other parts of your face. You have redness that gets worse or spreads to  other parts of your face. You have changes in how you see (vision). You have pain when you look at lights or things that move. You have a fever. Summary Blepharitis is swelling of the eyelids. Watch for any changes in how your eyes look or feel. Tell your doctor about any changes. Follow home care instructions as told by your doctor. Wash your hands often with soap and water for at least 20 seconds. Avoid wearing makeup. Do not rub your eyes. Use a warm compress, creams, or eye drops as told by your doctor. Let your doctor know if you have changes in how you see, blisters or a rash on your eyelids, or other problems. This information is not intended to replace advice given to you by your health care provider. Make sure you discuss any questions you have with your health care provider. Document Revised: 04/06/2020 Document Reviewed: 04/06/2020 Elsevier Patient Education  2024 [employer].

## 2023-08-05 ENCOUNTER — Ambulatory Visit: Payer: BC Managed Care – PPO | Admitting: Emergency Medicine

## 2023-09-05 ENCOUNTER — Ambulatory Visit: Admitting: Emergency Medicine

## 2023-10-30 ENCOUNTER — Ambulatory Visit: Admitting: Emergency Medicine

## 2023-11-27 ENCOUNTER — Ambulatory Visit: Admitting: Emergency Medicine

## 2023-11-27 ENCOUNTER — Encounter: Payer: Self-pay | Admitting: Emergency Medicine

## 2023-11-27 ENCOUNTER — Other Ambulatory Visit (HOSPITAL_COMMUNITY): Payer: Self-pay

## 2023-11-27 VITALS — BP 110/78 | HR 80 | Temp 98.5°F | Ht 67.0 in | Wt 268.0 lb

## 2023-11-27 DIAGNOSIS — L089 Local infection of the skin and subcutaneous tissue, unspecified: Secondary | ICD-10-CM | POA: Diagnosis not present

## 2023-11-27 DIAGNOSIS — E785 Hyperlipidemia, unspecified: Secondary | ICD-10-CM | POA: Diagnosis not present

## 2023-11-27 DIAGNOSIS — L723 Sebaceous cyst: Secondary | ICD-10-CM | POA: Diagnosis not present

## 2023-11-27 DIAGNOSIS — Z7985 Long-term (current) use of injectable non-insulin antidiabetic drugs: Secondary | ICD-10-CM

## 2023-11-27 DIAGNOSIS — E1169 Type 2 diabetes mellitus with other specified complication: Secondary | ICD-10-CM

## 2023-11-27 LAB — POCT GLYCOSYLATED HEMOGLOBIN (HGB A1C): Hemoglobin A1C: 11.5 % — AB (ref 4.0–5.6)

## 2023-11-27 MED ORDER — FLUCONAZOLE 150 MG PO TABS
150.0000 mg | ORAL_TABLET | Freq: Once | ORAL | 0 refills | Status: AC
  Filled 2023-11-27: qty 1, 1d supply, fill #0

## 2023-11-27 MED ORDER — DOXYCYCLINE HYCLATE 100 MG PO TABS
100.0000 mg | ORAL_TABLET | Freq: Two times a day (BID) | ORAL | 0 refills | Status: AC
  Filled 2023-11-27: qty 14, 7d supply, fill #0

## 2023-11-27 MED ORDER — TIRZEPATIDE 2.5 MG/0.5ML ~~LOC~~ SOAJ
2.5000 mg | SUBCUTANEOUS | 3 refills | Status: DC
  Filled 2023-11-27 – 2023-12-02 (×2): qty 2, 28d supply, fill #0
  Filled 2024-01-03: qty 2, 28d supply, fill #1

## 2023-11-27 MED ORDER — DAPAGLIFLOZIN PROPANEDIOL 10 MG PO TABS
10.0000 mg | ORAL_TABLET | Freq: Every day | ORAL | 3 refills | Status: AC
  Filled 2023-11-27: qty 30, 30d supply, fill #0

## 2023-11-27 NOTE — Assessment & Plan Note (Signed)
 Uncontrolled diabetes with hemoglobin A1c at 11.5 Not taking Ozempic  due to side effects Recommend to continue glipizide  5 mg twice a day and start Farxiga  10 mg daily Recommend to start Mounjaro  2.5 mg weekly Cardiovascular risks associated with uncontrolled diabetes discussed Diet and nutrition discussed Follow-up in 3 months

## 2023-11-27 NOTE — Assessment & Plan Note (Signed)
 Recommend to start doxycycline  100 mg twice a day Local care management discussed Vies to contact the office if no better or worse during the next several days

## 2023-11-27 NOTE — Patient Instructions (Signed)

## 2023-11-27 NOTE — Progress Notes (Signed)
 Maria Maddox 37 y.o.   Chief Complaint  Patient presents with   Boils     Patient has 4 boils along her pantie line area. Patient states first started 2 weeks ago with only 1 and now she 4 on both side of her groin area. She states doing the hot compression but hasn't helped. Wanted to also talk about her side effects with ozempic      HISTORY OF PRESENT ILLNESS: This is a 38 y.o. female complaining of slightly tender boils along inguinal lines for the past couple weeks Also want to follow-up on her diabetes.  Ozempic  was making her nauseous so has not taken it for the last 4 weeks No other complaints or medical concerns today.  HPI   Prior to Admission medications   Medication Sig Start Date End Date Taking? Authorizing Provider  dapagliflozin  propanediol (FARXIGA ) 5 MG TABS tablet Take 2 tablets (10 mg total) by mouth Maria. 11/27/23  Yes Mackensi Mahadeo, Emil Schanz, MD  doxycycline  (VIBRA -TABS) 100 MG tablet Take 1 tablet (100 mg total) by mouth 2 (two) times Maria for 7 days. 11/27/23 12/04/23 Yes Mekayla Soman, Emil Schanz, MD  ezetimibe  (ZETIA ) 10 MG tablet TAKE 1 TABLET (10 MG TOTAL) BY MOUTH Maria. 02/04/23  Yes Nela Bascom, Emil Schanz, MD  fluconazole  (DIFLUCAN ) 150 MG tablet Take 1 tablet (150 mg total) by mouth once for 1 dose. 11/27/23 11/28/23 Yes Mckenize Mezera, Emil Schanz, MD  glipiZIDE  (GLUCOTROL ) 5 MG tablet Take 1 tablet (5 mg total) by mouth 2 (two) times Maria before a meal. 02/04/23  Yes Fusaye Wachtel, Emil Schanz, MD  tirzepatide  (MOUNJARO ) 2.5 MG/0.5ML Pen Inject 2.5 mg into the skin once a week. 11/27/23  Yes Caelin Rayl, Emil Schanz, MD  alclomethasone (ACLOVATE ) 0.05 % cream Apply to face every day- DO NOT APPLY TO RIGHT EYE DUE TO GLAUCOMA Patient not taking: Reported on 11/27/2023 12/22/20   Sheffield, Kelli R, PA-C  atropine 1 % ophthalmic solution as needed. Patient not taking: Reported on 11/27/2023    [provider]  brimonidine (ALPHAGAN P) 0.1 % SOLN Place 1 drop into both eyes  2 (two) times Maria. Patient not taking: Reported on 11/27/2023    [provider]  dorzolamide-timolol (COSOPT) 22.3-6.8 MG/ML ophthalmic solution Place 1 drop into the right eye Maria. Patient not taking: Reported on 11/27/2023    [provider]  erythromycin  ophthalmic ointment Place 1 Application into the right eye at bedtime for 7 days. Patient not taking: Reported on 11/27/2023 07/01/23   Purcell Emil Schanz, MD  potassium chloride  (KLOR-CON ) 10 MEQ tablet Take 1 tablet (10 mEq total) by mouth 2 (two) times Maria. Patient not taking: Reported on 11/27/2023 06/06/21   Raspet, Erin K, PA-C  tacrolimus  (PROTOPIC ) 0.1 % ointment Apply to face every day Patient not taking: Reported on 11/27/2023 12/22/20   Sheffield, Kelli R, PA-C  timolol (TIMOPTIC) 0.25 % ophthalmic solution  04/23/19   [provider]  triamcinolone  cream (KENALOG ) 0.1 % Apply to affected area every day for body- NOT FOR FACE, FOLDS or GROIN Patient not taking: Reported on 11/27/2023 12/22/20   Porter Andrez SAUNDERS, PA-C    Allergies  Allergen Reactions   Crestor  [Rosuvastatin  Calcium ] Other (See Comments)    Per patient body pain   Amoxicillin  Rash    bad yeast infection    Patient Active Problem List   Diagnosis Date Noted   Blepharitis of right upper eyelid 07/01/2023   Statin intolerance 02/04/2020   Body mass index (BMI) of  39.0-39.9 in adult 02/04/2020   Dyslipidemia associated with type 2 diabetes mellitus (HCC) 10/14/2016   Morbid obesity (HCC) 01/15/2013   Polycystic ovaries 01/15/2013   Glaucoma 07/02/2011    Past Medical History:  Diagnosis Date   Diabetes mellitus without complication (HCC)    Eczema    Glaucoma    has had for 10 yrears   Legally blind in right eye, as defined in [place]     PCOS (polycystic ovarian syndrome)     Past Surgical History:  Procedure Laterality Date   EYE SURGERY     right side, numerous surgeries (now blind)   PILONIDAL CYST EXCISION N/A  10/26/2015   Procedure: CYST EXCISION PILONIDAL;  Surgeon: Vicenta Poli, MD;  Location: [facility];  Service: General;  Laterality: N/A;    Social History   Socioeconomic History   Marital status: Married    Spouse name: Not on file   Number of children: Not on file   Years of education: Not on file   Highest education level: Not on file  Occupational History   Not on file  Tobacco Use   Smoking status: Never   Smokeless tobacco: Never  Vaping Use   Vaping status: Never Used  Substance and Sexual Activity   Alcohol use: No   Drug use: No   Sexual activity: Yes    Birth control/protection: None  Other Topics Concern   Not on file  Social History Narrative   Not on file   Social Drivers of Health   Financial Resource Strain: Not on file  Food Insecurity: Not on file  Transportation Needs: Not on file  Physical Activity: Not on file  Stress: Not on file  Social Connections: Not on file  Intimate Partner Violence: Not on file    Family History  Problem Relation Age of Onset   Arthritis Mother    Depression Mother    Diabetes Mother    Hyperlipidemia Mother    Arthritis Father    Breast cancer Maternal Aunt    Cancer Maternal Aunt 45       Breast Cancer   Breast cancer Paternal Aunt    Breast cancer Maternal Grandmother    Cancer Maternal Grandmother        breast   Diabetes Paternal Grandmother    Diabetes Paternal Grandfather    Hearing loss Paternal Grandfather      Review of Systems  Constitutional: Negative.  Negative for chills and fever.  HENT: Negative.  Negative for congestion and sore throat.   Respiratory: Negative.  Negative for cough and shortness of breath.   Cardiovascular: Negative.  Negative for chest pain and palpitations.  Gastrointestinal:  Negative for abdominal pain, diarrhea, nausea and vomiting.  Genitourinary: Negative.  Negative for dysuria and hematuria.  Skin: Negative.  Negative for rash.  Neurological:  Negative.  Negative for dizziness and headaches.  All other systems reviewed and are negative.   Vitals:   11/27/23 1556  BP: 110/78  Pulse: 80  Temp: 98.5 F (36.9 C)  SpO2: 97%    Physical Exam Vitals reviewed.  Constitutional:      Appearance: Normal appearance. She is obese.  HENT:     Head: Normocephalic.     Mouth/Throat:     Mouth: Mucous membranes are moist.     Pharynx: Oropharynx is clear.  Cardiovascular:     Rate and Rhythm: Normal rate and regular rhythm.     Pulses: Normal pulses.  Heart sounds: Normal heart sounds.  Pulmonary:     Effort: Pulmonary effort is normal.     Breath sounds: Normal breath sounds.  Musculoskeletal:     Cervical back: No tenderness.  Lymphadenopathy:     Cervical: No cervical adenopathy.  Skin:    General: Skin is warm and dry.     Capillary Refill: Capillary refill takes less than 2 seconds.     Comments: Several infected sebaceous cysts to both inguinal areas.  No signs of abscess formation  Neurological:     General: No focal deficit present.     Mental Status: She is alert and oriented to person, place, and time.  Psychiatric:        Mood and Affect: Mood normal.        Behavior: Behavior normal.    Results for orders placed or performed in visit on 11/27/23 (from the past 24 hours)  POCT HgB A1C     Status: Abnormal   Collection Time: 11/27/23  4:24 PM  Result Value Ref Range   Hemoglobin A1C 11.5 (A) 4.0 - 5.6 %   HbA1c POC (<> result, manual entry)     HbA1c, POC (prediabetic range)     HbA1c, POC (controlled diabetic range)       ASSESSMENT & PLAN: A total of 42 minutes was spent with the patient and counseling/coordination of care regarding preparing for this visit, review of most recent office visit notes, review of multiple chronic medical conditions and their management, cardiovascular risks associated with uncontrolled diabetes, review of all medications and changes made, review of most recent bloodwork  results including interpretation of today's hemoglobin A1c, review of health maintenance items, education on nutrition, prognosis, documentation, and need for follow up.   Problem List Items Addressed This Visit       Endocrine   Dyslipidemia associated with type 2 diabetes mellitus (HCC) - Primary   Uncontrolled diabetes with hemoglobin A1c at 11.5 Not taking Ozempic  due to side effects Recommend to continue glipizide  5 mg twice a day and start Farxiga  10 mg Maria Recommend to start Mounjaro  2.5 mg weekly Cardiovascular risks associated with uncontrolled diabetes discussed Diet and nutrition discussed Follow-up in 3 months      Relevant Medications   tirzepatide  (MOUNJARO ) 2.5 MG/0.5ML Pen   dapagliflozin  propanediol (FARXIGA ) 5 MG TABS tablet   Other Relevant Orders   POCT HgB A1C (Completed)     Musculoskeletal and Integument   Infected sebaceous cyst of skin   Recommend to start doxycycline  100 mg twice a day Local care management discussed Vies to contact the office if no better or worse during the next several days      Relevant Medications   doxycycline  (VIBRA -TABS) 100 MG tablet   fluconazole  (DIFLUCAN ) 150 MG tablet   Patient Instructions  Diabetes: Carbohydrate Counting for Adults Carbohydrate counting is a method of keeping track of how many carbohydrates you eat. Eating carbohydrates increases the amount of sugar, also called glucose, in your blood. By counting how many carbohydrates you eat, you can improve how well you manage your blood sugar. This, in turn, helps you manage your diabetes. Carbohydrates are measured in grams (g) per serving. It's important to know how many carbohydrates (in grams or by serving size) you can have in each meal. This is different for every person. A dietitian can help you make a meal plan and calculate how many carbohydrates you should have at each meal and snack. What foods contain carbohydrates?  Carbohydrates are found in these  foods: Grains, such as breads and cereals. Dried beans and soy products. Starchy vegetables, such as potatoes, peas, and corn. Fruit and fruit juices. Milk and yogurt. Sweets and snack foods like cake, cookies, candy, chips, and soft drinks. How do I count carbohydrates in foods? There are two ways to count carbohydrates in food. You can read food labels or learn standard serving sizes of foods. You can use either of these methods or a combination of both. Using the Nutrition Facts label The Nutrition Facts list is included on the labels of almost all packaged foods and drinks in the [place] It includes: The serving size. Information about nutrients in each serving. This includes the grams of carbohydrate per serving. To use the Nutrition Facts, decide how many servings you will have. Then, multiply the number of servings by the number of carbohydrates per serving. The resulting number is the total grams of carbohydrates that you'll be having. Learning the standard serving sizes of foods When you eat carbohydrate foods that aren't packaged or don't include Nutrition Facts on the label, you need to measure the servings in order to count the grams of carbohydrates. Measure the foods that you'll eat with a food scale or measuring cup, if needed. Decide how many standard-size servings you'll eat. Multiply the number of servings by 15. For foods that contain carbohydrates, one serving equals 15 g of carbohydrates. For example, if you eat 2 cups or 10 oz (300 g) of strawberries, you'll have eaten 2 servings and 30 g of carbohydrates (2 servings x 15 g = 30 g). For foods that have more than one food mixed, such as soups and casseroles, you must count the carbohydrates in each food that's included. Here's a list of standard serving sizes for common carbohydrate-rich foods. Each of these servings has about 15 g of carbohydrates: 1 slice of bread. 1 six-inch (15 cm) tortilla. ? cup or 2 oz (53 g) of  cooked rice or pasta.  cup or 3 oz (85 g) of cooked or canned, drained, and rinsed beans or lentils.  cup or 3 oz (85 g) of a starchy vegetable, such as peas, corn, or squash.  cup or 4 oz (120 g) of hot cereal.  cup or 3 oz (85 g) of boiled or mashed potatoes, or  or 3 oz (85 g) of a large baked potato.  cup or 4 fl oz (118 mL) of fruit juice. 1 cup or 8 fl oz (237 mL) of milk. 1 small or 4 oz (106 g) apple.  or 2 oz (63 g) of a medium banana. 1 cup or 5 oz (150 g) of strawberries. 3 cups or 1 oz (28.3 g) of popped popcorn. What is an example of carbohydrate counting? To calculate the grams of carbohydrates in this sample meal, follow the steps below. Sample meal 3 oz (85 g) chicken breast. ? cup or 4 oz (106 g) of brown rice.  cup or 3 oz (85 g) of corn. 1 cup or 8 fl oz (237 mL) of milk. 1 cup or 5 oz (150 g) of strawberries with sugar-free whipped topping. Carbohydrate calculation Identify the foods that have carbohydrates: Rice. Corn. Milk. Strawberries. Calculate how many servings you have of each food: 2 servings of rice. 1 serving of corn. 1 serving of milk. 1 serving of strawberries. Multiply each number of servings by 15 g: 2 servings of rice x 15 g = 30 g. 1 serving of corn x  15 g = 15 g. 1 serving of milk x 15 g = 15 g. 1 serving of strawberries x 15 g = 15 g. Add together all of the amounts to find the total grams of carbohydrates eaten: 30 g + 15 g + 15 g + 15 g = 75 g of carbohydrates total. Where to find more information To learn more, go to: American Diabetes Association at diabetes.org. Click Search and type carb counting. Find the link you need. Centers for Disease Control and Prevention at TonerPromos.no. Click Search and type diabetes. Find the link you need. Academy of Nutrition and Dietetics: eatright.org This information is not intended to replace advice given to you by your health care provider. Make sure you discuss any questions you  have with your health care provider. Document Revised: 02/20/2023 Document Reviewed: 02/20/2023 Elsevier Patient Education  2025 Elsevier Inc.     Emil Schaumann, MD [facility]

## 2023-11-28 ENCOUNTER — Other Ambulatory Visit: Payer: Self-pay

## 2023-11-28 ENCOUNTER — Other Ambulatory Visit (HOSPITAL_COMMUNITY): Payer: Self-pay

## 2023-11-28 ENCOUNTER — Telehealth: Payer: Self-pay | Admitting: Radiology

## 2023-11-28 NOTE — Telephone Encounter (Signed)
 Copied from CRM #[id number]. Topic: Clinical - Prescription Issue >> Nov 28, 2023  8:10 AM Carlyon D wrote: Reason for CRM:Pt is stating she was in office yesterday and got prescribed dapagliflozin  propanediol (FARXIGA ) 10 MG TABS tablet, pt states she can not take them due to it giving her a yeast infection as she's taken it before. Pt would like to know if pcp can up your dosage glipiZIDE  (GLUCOTROL ) 5 MG tablet or even change it back to metformin  is ok. Pt also states trizepetide needs prior auth pt states the insurance needs more info.  Please reach out to pt for update

## 2023-11-29 ENCOUNTER — Other Ambulatory Visit: Payer: Self-pay | Admitting: Emergency Medicine

## 2023-11-29 ENCOUNTER — Telehealth: Payer: Self-pay

## 2023-11-29 ENCOUNTER — Other Ambulatory Visit (HOSPITAL_COMMUNITY): Payer: Self-pay

## 2023-11-29 ENCOUNTER — Telehealth: Payer: Self-pay | Admitting: Radiology

## 2023-11-29 ENCOUNTER — Other Ambulatory Visit: Payer: Self-pay

## 2023-11-29 MED ORDER — METFORMIN HCL 500 MG PO TABS
500.0000 mg | ORAL_TABLET | Freq: Two times a day (BID) | ORAL | 3 refills | Status: AC
  Filled 2023-11-29: qty 180, 90d supply, fill #0
  Filled 2024-02-26: qty 180, 90d supply, fill #1

## 2023-11-29 MED ORDER — GLIPIZIDE 10 MG PO TABS
10.0000 mg | ORAL_TABLET | Freq: Two times a day (BID) | ORAL | 3 refills | Status: AC
  Filled 2023-11-29: qty 180, 90d supply, fill #0

## 2023-11-29 NOTE — Telephone Encounter (Signed)
 Pharmacy Patient Advocate Encounter   Received notification from Pt Calls Messages that prior authorization for Mounjaro  2.5mg /0.63ml is required/requested.   Insurance verification completed.   The patient is insured through [facility] .   Per test claim: PA required; PA submitted to above mentioned insurance via Latent Key/confirmation #/EOC [id number] Status is pending

## 2023-11-29 NOTE — Telephone Encounter (Signed)
 Recommend to increase dose of glipizide  10 mg twice a day and start metformin  500 mg twice a day.  New prescriptions sent to pharmacy of record today.  Please look into prior authorization for Mounjaro 

## 2023-11-29 NOTE — Telephone Encounter (Signed)
 Copied from CRM #[id number]. Topic: Clinical - Medication Question >> Nov 29, 2023 12:41 PM Bradee M wrote: Reason for CRM: pt calling in for update about medication request and also to find out if provider if working on [place]. She states she has not heard from anyone yesterday and would like someone just to call and let her know what's going on

## 2023-12-02 ENCOUNTER — Other Ambulatory Visit (HOSPITAL_COMMUNITY): Payer: Self-pay

## 2023-12-02 NOTE — Telephone Encounter (Signed)
 Pharmacy Patient Advocate Encounter  Received notification from [facility] that Prior Authorization for Mounjaro  2.5mg /0.43ml has been APPROVED from 11/29/23 to 11/28/24   PA #/Case ID/Reference #: [id number]

## 2024-01-03 ENCOUNTER — Other Ambulatory Visit (HOSPITAL_COMMUNITY): Payer: Self-pay

## 2024-01-06 ENCOUNTER — Other Ambulatory Visit (HOSPITAL_COMMUNITY): Payer: Self-pay

## 2024-01-06 ENCOUNTER — Other Ambulatory Visit: Payer: Self-pay | Admitting: Emergency Medicine

## 2024-01-06 DIAGNOSIS — E1169 Type 2 diabetes mellitus with other specified complication: Secondary | ICD-10-CM

## 2024-01-06 MED ORDER — MOUNJARO 2.5 MG/0.5ML ~~LOC~~ SOAJ
2.5000 mg | SUBCUTANEOUS | 3 refills | Status: DC
Start: 2024-01-06 — End: 2024-01-10
  Filled 2024-01-06: qty 2, 28d supply, fill #0

## 2024-01-07 ENCOUNTER — Telehealth: Payer: Self-pay

## 2024-01-07 NOTE — Telephone Encounter (Signed)
 Copied from CRM #[id number]. Topic: Clinical - Prescription Issue >> Jan 07, 2024  8:53 AM Harlene ORN wrote: Reason for CRM: Patient is having trouble getting her Mounjaro  medication. Please call the patient to discuss and the pharmacy to confirm patient's prescription

## 2024-01-09 ENCOUNTER — Telehealth: Payer: Self-pay

## 2024-01-09 NOTE — Telephone Encounter (Signed)
 This is a Dr. Purcell patient and he is not here until Monday. Patient has not had her Mounjaro  going on two weeks. Her insurance says that her dose needs to be increased for them to approve it, can you send the next dose in for her in the meantime so she can get back on it please.

## 2024-01-09 NOTE — Telephone Encounter (Signed)
 Left voicemail to call office back so I can figure out what problem she is having.

## 2024-01-09 NOTE — Telephone Encounter (Signed)
 Spoke to patient and her insurance requires dosage increase to be approved. I send a telephone encounter to Lauraine Pereyra, NP asking if she can increase the dose since Dr. Sagardia is out until Monday and patient has been without for going on two weeks.

## 2024-01-10 ENCOUNTER — Other Ambulatory Visit (HOSPITAL_COMMUNITY): Payer: Self-pay

## 2024-01-10 ENCOUNTER — Other Ambulatory Visit: Payer: Self-pay | Admitting: Emergency Medicine

## 2024-01-10 ENCOUNTER — Other Ambulatory Visit: Payer: Self-pay | Admitting: Nurse Practitioner

## 2024-01-10 DIAGNOSIS — E1169 Type 2 diabetes mellitus with other specified complication: Secondary | ICD-10-CM

## 2024-01-10 MED ORDER — MOUNJARO 2.5 MG/0.5ML ~~LOC~~ SOAJ
2.5000 mg | SUBCUTANEOUS | 0 refills | Status: DC
  Filled 2024-01-10: qty 2, 28d supply, fill #0

## 2024-01-10 MED ORDER — TIRZEPATIDE 5 MG/0.5ML ~~LOC~~ SOAJ
5.0000 mg | SUBCUTANEOUS | 3 refills | Status: AC
  Filled 2024-01-10: qty 2, 28d supply, fill #0
  Filled 2024-02-10 – 2024-02-26 (×2): qty 2, 28d supply, fill #1
  Filled 2024-03-27: qty 2, 28d supply, fill #2
  Filled 2024-04-23: qty 2, 28d supply, fill #3

## 2024-01-10 NOTE — Telephone Encounter (Signed)
 New prescription for Mounjaro  5 mg sent to pharmacy of record today.  Thanks.

## 2024-01-13 NOTE — Telephone Encounter (Signed)
 Spoke to patient and let her know medication was sent in.

## 2024-02-14 ENCOUNTER — Other Ambulatory Visit (HOSPITAL_COMMUNITY): Payer: Self-pay

## 2024-02-21 ENCOUNTER — Other Ambulatory Visit (HOSPITAL_COMMUNITY): Payer: Self-pay

## 2024-02-27 ENCOUNTER — Ambulatory Visit: Admitting: Emergency Medicine

## 2024-02-27 ENCOUNTER — Encounter: Payer: Self-pay | Admitting: Emergency Medicine

## 2024-02-27 VITALS — BP 140/80 | HR 88 | Temp 98.7°F | Ht 67.0 in | Wt 270.0 lb

## 2024-02-27 DIAGNOSIS — Z7984 Long term (current) use of oral hypoglycemic drugs: Secondary | ICD-10-CM

## 2024-02-27 DIAGNOSIS — Z789 Other specified health status: Secondary | ICD-10-CM | POA: Diagnosis not present

## 2024-02-27 DIAGNOSIS — R030 Elevated blood-pressure reading, without diagnosis of hypertension: Secondary | ICD-10-CM | POA: Diagnosis not present

## 2024-02-27 DIAGNOSIS — E1169 Type 2 diabetes mellitus with other specified complication: Secondary | ICD-10-CM | POA: Diagnosis not present

## 2024-02-27 DIAGNOSIS — E785 Hyperlipidemia, unspecified: Secondary | ICD-10-CM

## 2024-02-27 LAB — POCT GLYCOSYLATED HEMOGLOBIN (HGB A1C): HbA1c POC (<> result, manual entry): 8.7 % (ref 4.0–5.6)

## 2024-02-27 NOTE — Progress Notes (Signed)
 Maria Maddox 38 y.o.   Chief Complaint  Patient presents with   Follow-up    3 months right foot swelling off and on As well as in her right index finger     HISTORY OF PRESENT ILLNESS: This is a 38 y.o. female here for 45-month follow-up of chronic medical conditions including diabetes Has occasional swelling to right foot and right index finger.  Better today Lab Results  Component Value Date   HGBA1C 11.5 (A) 11/27/2023   Wt Readings from Last 3 Encounters:  02/27/24 270 lb (122.5 kg)  11/27/23 268 lb (121.6 kg)  07/01/23 265 lb (120.2 kg)   BP Readings from Last 3 Encounters:  02/27/24 (!) 140/80  11/27/23 110/78  07/01/23 112/76     HPI   Prior to Admission medications  Medication Sig Start Date End Date Taking? Authorizing Provider  dapagliflozin  propanediol (FARXIGA ) 10 MG TABS tablet Take 1 tablet (10 mg total) by mouth daily. 11/27/23  Yes Sharlett Lienemann, Emil Schanz, MD  ezetimibe  (ZETIA ) 10 MG tablet TAKE 1 TABLET (10 MG TOTAL) BY MOUTH DAILY. 02/04/23  Yes Sulema Braid, Emil Schanz, MD  glipiZIDE  (GLUCOTROL ) 10 MG tablet Take 1 tablet (10 mg total) by mouth 2 (two) times daily before a meal. 11/29/23  Yes Euell Schiff, Emil Schanz, MD  metFORMIN  (GLUCOPHAGE ) 500 MG tablet Take 1 tablet (500 mg total) by mouth 2 (two) times daily with a meal. 11/29/23  Yes Williemae Muriel, Emil Schanz, MD  tirzepatide  (MOUNJARO ) 5 MG/0.5ML Pen Inject 5 mg into the skin once a week. 01/10/24  Yes Eston Heslin, Emil Schanz, MD  alclomethasone (ACLOVATE ) 0.05 % cream Apply to face every day- DO NOT APPLY TO RIGHT EYE DUE TO GLAUCOMA Patient not taking: Reported on 02/27/2024 12/22/20   Sheffield, Kelli R, PA-C  atropine 1 % ophthalmic solution as needed. Patient not taking: Reported on 02/27/2024    [provider]  brimonidine (ALPHAGAN P) 0.1 % SOLN Place 1 drop into both eyes 2 (two) times daily. Patient not taking: Reported on 02/27/2024    [provider]  dorzolamide-timolol (COSOPT)  22.3-6.8 MG/ML ophthalmic solution Place 1 drop into the right eye daily. Patient not taking: Reported on 02/27/2024    [provider]  erythromycin  ophthalmic ointment Place 1 Application into the right eye at bedtime for 7 days. Patient not taking: Reported on 02/27/2024 07/01/23   Purcell Emil Schanz, MD  potassium chloride  (KLOR-CON ) 10 MEQ tablet Take 1 tablet (10 mEq total) by mouth 2 (two) times daily. Patient not taking: Reported on 02/27/2024 06/06/21   Raspet, Rocky K, PA-C  tacrolimus  (PROTOPIC ) 0.1 % ointment Apply to face every day Patient not taking: Reported on 02/27/2024 12/22/20   Porter Andrez SAUNDERS, PA-C  timolol (TIMOPTIC) 0.25 % ophthalmic solution  04/23/19   [provider]  triamcinolone  cream (KENALOG ) 0.1 % Apply to affected area every day for body- NOT FOR FACE, FOLDS or GROIN Patient not taking: Reported on 02/27/2024 12/22/20   Porter Andrez SAUNDERS, PA-C    Allergies[1]  Patient Active Problem List   Diagnosis Date Noted   Infected sebaceous cyst of skin 11/27/2023   Blepharitis of right upper eyelid 07/01/2023   Statin intolerance 02/04/2020   Body mass index (BMI) of 39.0-39.9 in adult 02/04/2020   Dyslipidemia associated with type 2 diabetes mellitus (HCC) 10/14/2016   Morbid obesity (HCC) 01/15/2013   Polycystic ovaries 01/15/2013   Glaucoma 07/02/2011    Past Medical History:  Diagnosis Date   Diabetes mellitus  without complication (HCC)    Eczema    Glaucoma    has had for 10 yrears   Legally blind in right eye, as defined in [place]     PCOS (polycystic ovarian syndrome)     Past Surgical History:  Procedure Laterality Date   EYE SURGERY     right side, numerous surgeries (now blind)   PILONIDAL CYST EXCISION N/A 10/26/2015   Procedure: CYST EXCISION PILONIDAL;  Surgeon: Vicenta Poli, MD;  Location: [facility];  Service: General;  Laterality: N/A;    Social History   Socioeconomic History   Marital status:  Married    Spouse name: Not on file   Number of children: Not on file   Years of education: Not on file   Highest education level: Not on file  Occupational History   Not on file  Tobacco Use   Smoking status: Never   Smokeless tobacco: Never  Vaping Use   Vaping status: Never Used  Substance and Sexual Activity   Alcohol use: No   Drug use: No   Sexual activity: Yes    Birth control/protection: None  Other Topics Concern   Not on file  Social History Narrative   Not on file   Social Drivers of Health   Tobacco Use: Low Risk (02/27/2024)   Patient History    Smoking Tobacco Use: Never    Smokeless Tobacco Use: Never    Passive Exposure: Not on file  Financial Resource Strain: Not on file  Food Insecurity: Not on file  Transportation Needs: Not on file  Physical Activity: Not on file  Stress: Not on file  Social Connections: Not on file  Intimate Partner Violence: Not on file  Depression (PHQ2-9): Low Risk (11/27/2023)   Depression (PHQ2-9)    PHQ-2 Score: 0  Alcohol Screen: Not on file  Housing: Not on file  Utilities: Not on file  Health Literacy: Not on file    Family History  Problem Relation Age of Onset   Arthritis Mother    Depression Mother    Diabetes Mother    Hyperlipidemia Mother    Arthritis Father    Breast cancer Maternal Aunt    Cancer Maternal Aunt 59       Breast Cancer   Breast cancer Paternal Aunt    Breast cancer Maternal Grandmother    Cancer Maternal Grandmother        breast   Diabetes Paternal Grandmother    Diabetes Paternal Grandfather    Hearing loss Paternal Grandfather      Review of Systems  Constitutional: Negative.  Negative for chills and fever.  HENT: Negative.  Negative for congestion and sore throat.   Respiratory: Negative.  Negative for cough and shortness of breath.   Cardiovascular: Negative.  Negative for chest pain and palpitations.  Gastrointestinal:  Negative for abdominal pain, diarrhea, nausea and  vomiting.  Genitourinary: Negative.  Negative for dysuria and hematuria.  Skin: Negative.  Negative for rash.  Neurological: Negative.  Negative for dizziness and headaches.  All other systems reviewed and are negative.   Vitals:   02/27/24 1538 02/27/24 1547  BP: (!) 140/100 (!) 140/80  Pulse: 88   Temp: 98.7 F (37.1 C)   SpO2: 98%     Physical Exam Vitals reviewed.  Constitutional:      Appearance: Normal appearance.  HENT:     Head: Normocephalic.     Mouth/Throat:     Mouth: Mucous membranes are moist.  Pharynx: Oropharynx is clear.  Cardiovascular:     Rate and Rhythm: Normal rate and regular rhythm.     Pulses: Normal pulses.     Heart sounds: Normal heart sounds.  Pulmonary:     Effort: Pulmonary effort is normal.     Breath sounds: Normal breath sounds.  Abdominal:     Palpations: Abdomen is soft.     Tenderness: There is no abdominal tenderness.  Musculoskeletal:     Cervical back: No tenderness.  Lymphadenopathy:     Cervical: No cervical adenopathy.  Skin:    General: Skin is warm and dry.     Capillary Refill: Capillary refill takes less than 2 seconds.  Neurological:     General: No focal deficit present.     Mental Status: She is alert and oriented to person, place, and time.  Psychiatric:        Mood and Affect: Mood normal.        Behavior: Behavior normal.    Results for orders placed or performed in visit on 02/27/24 (from the past 24 hours)  POCT HgB A1C     Status: Abnormal   Collection Time: 02/27/24  4:03 PM  Result Value Ref Range   Hemoglobin A1C     HbA1c POC (<> result, manual entry) 8.7 4.0 - 5.6 %   HbA1c, POC (prediabetic range)     HbA1c, POC (controlled diabetic range)       ASSESSMENT & PLAN: A total of 42 minutes was spent with the patient and counseling/coordination of care regarding preparing for this visit, review of most recent office visit notes, review of multiple chronic medical conditions and their management,  cardiovascular risks associated with uncontrolled diabetes, review of all medications, review of most recent bloodwork results including interpretation of today's hemoglobin A1c, review of health maintenance items, education on nutrition, prognosis, documentation, and need for follow up.  Problem List Items Addressed This Visit       Endocrine   Dyslipidemia associated with type 2 diabetes mellitus (HCC) - Primary   Much improved hemoglobin A1c at 8.7 today, still not at goal Cardiovascular risks associated with diabetes discussed Diet and nutrition discussed Continue metformin  500 mg twice a day, weekly Mounjaro  5 mg, glipizide  10 mg twice a day, and Farxiga  10 mg daily Continues Zetia  10 mg daily      Relevant Orders   POCT HgB A1C (Completed)     Other   Morbid obesity (HCC)   Wt Readings from Last 3 Encounters:  02/27/24 270 lb (122.5 kg)  11/27/23 268 lb (121.6 kg)  07/01/23 265 lb (120.2 kg)   Diet and nutrition discussed Advised to decrease amount of daily carbohydrate intake and daily calories and increase amount of plant-based protein in her diet. Benefits of exercise discussed      Statin intolerance   Elevated blood pressure reading   BP Readings from Last 3 Encounters:  02/27/24 (!) 140/80  11/27/23 110/78  07/01/23 112/76  Elevated blood pressure reading in the office today Advised to monitor blood pressure readings daily at home for the next several weeks and contact the office if numbers persistently abnormal Dietary approaches to stop hypertension discussed Benefits of exercise discussed       Patient Instructions  Diabetes: Carbohydrate Counting for Adults Carbohydrate counting is a method of keeping track of how many carbohydrates you eat. Eating carbohydrates increases the amount of sugar, also called glucose, in your blood. By counting how many carbohydrates you  eat, you can improve how well you manage your blood sugar. This, in turn, helps you manage  your diabetes. Carbohydrates are measured in grams (g) per serving. It's important to know how many carbohydrates (in grams or by serving size) you can have in each meal. This is different for every person. A dietitian can help you make a meal plan and calculate how many carbohydrates you should have at each meal and snack. What foods contain carbohydrates? Carbohydrates are found in these foods: Grains, such as breads and cereals. Dried beans and soy products. Starchy vegetables, such as potatoes, peas, and corn. Fruit and fruit juices. Milk and yogurt. Sweets and snack foods like cake, cookies, candy, chips, and soft drinks. How do I count carbohydrates in foods? There are two ways to count carbohydrates in food. You can read food labels or learn standard serving sizes of foods. You can use either of these methods or a combination of both. Using the Nutrition Facts label The Nutrition Facts list is included on the labels of almost all packaged foods and drinks in the [place] It includes: The serving size. Information about nutrients in each serving. This includes the grams of carbohydrate per serving. To use the Nutrition Facts, decide how many servings you will have. Then, multiply the number of servings by the number of carbohydrates per serving. The resulting number is the total grams of carbohydrates that you'll be having. Learning the standard serving sizes of foods When you eat carbohydrate foods that aren't packaged or don't include Nutrition Facts on the label, you need to measure the servings in order to count the grams of carbohydrates. Measure the foods that you'll eat with a food scale or measuring cup, if needed. Decide how many standard-size servings you'll eat. Multiply the number of servings by 15. For foods that contain carbohydrates, one serving equals 15 g of carbohydrates. For example, if you eat 2 cups or 10 oz (300 g) of strawberries, you'll have eaten 2 servings and 30 g  of carbohydrates (2 servings x 15 g = 30 g). For foods that have more than one food mixed, such as soups and casseroles, you must count the carbohydrates in each food that's included. Here's a list of standard serving sizes for common carbohydrate-rich foods. Each of these servings has about 15 g of carbohydrates: 1 slice of bread. 1 six-inch (15 cm) tortilla. ? cup or 2 oz (53 g) of cooked rice or pasta.  cup or 3 oz (85 g) of cooked or canned, drained, and rinsed beans or lentils.  cup or 3 oz (85 g) of a starchy vegetable, such as peas, corn, or squash.  cup or 4 oz (120 g) of hot cereal.  cup or 3 oz (85 g) of boiled or mashed potatoes, or  or 3 oz (85 g) of a large baked potato.  cup or 4 fl oz (118 mL) of fruit juice. 1 cup or 8 fl oz (237 mL) of milk. 1 small or 4 oz (106 g) apple.  or 2 oz (63 g) of a medium banana. 1 cup or 5 oz (150 g) of strawberries. 3 cups or 1 oz (28.3 g) of popped popcorn. What is an example of carbohydrate counting? To calculate the grams of carbohydrates in this sample meal, follow the steps below. Sample meal 3 oz (85 g) chicken breast. ? cup or 4 oz (106 g) of brown rice.  cup or 3 oz (85 g) of corn. 1 cup or 8 fl  oz (237 mL) of milk. 1 cup or 5 oz (150 g) of strawberries with sugar-free whipped topping. Carbohydrate calculation Identify the foods that have carbohydrates: Rice. Corn. Milk. Strawberries. Calculate how many servings you have of each food: 2 servings of rice. 1 serving of corn. 1 serving of milk. 1 serving of strawberries. Multiply each number of servings by 15 g: 2 servings of rice x 15 g = 30 g. 1 serving of corn x 15 g = 15 g. 1 serving of milk x 15 g = 15 g. 1 serving of strawberries x 15 g = 15 g. Add together all of the amounts to find the total grams of carbohydrates eaten: 30 g + 15 g + 15 g + 15 g = 75 g of carbohydrates total. Where to find more information To learn more, go to: American Diabetes  Association at diabetes.org. Click Search and type carb counting. Find the link you need. Centers for Disease Control and Prevention at tonerpromos.no. Click Search and type diabetes. Find the link you need. Academy of Nutrition and Dietetics: eatright.org This information is not intended to replace advice given to you by your health care provider. Make sure you discuss any questions you have with your health care provider. Document Revised: 02/20/2023 Document Reviewed: 02/20/2023 Elsevier Patient Education  2025 Elsevier Inc.      Emil Schaumann, MD [facility]    [1]  Allergies Allergen Reactions   Crestor  [Rosuvastatin  Calcium ] Other (See Comments)    Per patient body pain   Amoxicillin  Rash    bad yeast infection

## 2024-02-27 NOTE — Patient Instructions (Signed)

## 2024-02-27 NOTE — Assessment & Plan Note (Signed)
 BP Readings from Last 3 Encounters:  02/27/24 (!) 140/80  11/27/23 110/78  07/01/23 112/76  Elevated blood pressure reading in the office today Advised to monitor blood pressure readings daily at home for the next several weeks and contact the office if numbers persistently abnormal Dietary approaches to stop hypertension discussed Benefits of exercise discussed

## 2024-02-27 NOTE — Assessment & Plan Note (Signed)
 Much improved hemoglobin A1c at 8.7 today, still not at goal Cardiovascular risks associated with diabetes discussed Diet and nutrition discussed Continue metformin  500 mg twice a day, weekly Mounjaro  5 mg, glipizide  10 mg twice a day, and Farxiga  10 mg daily Continues Zetia  10 mg daily

## 2024-02-27 NOTE — Assessment & Plan Note (Signed)
 Wt Readings from Last 3 Encounters:  02/27/24 270 lb (122.5 kg)  11/27/23 268 lb (121.6 kg)  07/01/23 265 lb (120.2 kg)   Diet and nutrition discussed Advised to decrease amount of daily carbohydrate intake and daily calories and increase amount of plant-based protein in her diet. Benefits of exercise discussed

## 2024-02-27 NOTE — Assessment & Plan Note (Signed)
Sees ophthalmologist on a regular basis. 

## 2024-03-14 ENCOUNTER — Telehealth

## 2024-03-14 DIAGNOSIS — B3731 Acute candidiasis of vulva and vagina: Secondary | ICD-10-CM | POA: Diagnosis not present

## 2024-03-15 ENCOUNTER — Other Ambulatory Visit: Payer: Self-pay

## 2024-03-15 ENCOUNTER — Other Ambulatory Visit (HOSPITAL_COMMUNITY): Payer: Self-pay

## 2024-03-15 MED ORDER — FLUCONAZOLE 150 MG PO TABS
150.0000 mg | ORAL_TABLET | ORAL | 0 refills | Status: AC | PRN
  Filled 2024-03-15: qty 2, 6d supply, fill #0

## 2024-03-15 NOTE — Progress Notes (Signed)

## 2024-03-16 ENCOUNTER — Other Ambulatory Visit (HOSPITAL_COMMUNITY): Payer: Self-pay

## 2024-04-23 ENCOUNTER — Other Ambulatory Visit: Payer: Self-pay | Admitting: Emergency Medicine

## 2024-04-23 ENCOUNTER — Other Ambulatory Visit: Payer: Self-pay

## 2024-04-23 ENCOUNTER — Other Ambulatory Visit (HOSPITAL_COMMUNITY): Payer: Self-pay

## 2024-04-23 DIAGNOSIS — E1169 Type 2 diabetes mellitus with other specified complication: Secondary | ICD-10-CM

## 2024-04-23 MED ORDER — EZETIMIBE 10 MG PO TABS
10.0000 mg | ORAL_TABLET | Freq: Every day | ORAL | 3 refills | Status: AC
  Filled 2024-04-23: qty 90, 90d supply, fill #0

## 2024-05-27 ENCOUNTER — Ambulatory Visit: Admitting: Emergency Medicine
